# Patient Record
Sex: Female | Born: 1960 | Race: White | Hispanic: No | Smoking: Never smoker
Health system: Southern US, Community
[De-identification: ages and names within clinical notes are randomized; demographics above are authoritative.]

## PROBLEM LIST (undated history)

## (undated) DIAGNOSIS — F329 Major depressive disorder, single episode, unspecified: Secondary | ICD-10-CM

## (undated) DIAGNOSIS — T7840XA Allergy, unspecified, initial encounter: Secondary | ICD-10-CM

## (undated) DIAGNOSIS — F419 Anxiety disorder, unspecified: Secondary | ICD-10-CM

## (undated) DIAGNOSIS — M419 Scoliosis, unspecified: Secondary | ICD-10-CM

## (undated) DIAGNOSIS — M1990 Unspecified osteoarthritis, unspecified site: Secondary | ICD-10-CM

## (undated) DIAGNOSIS — G43909 Migraine, unspecified, not intractable, without status migrainosus: Secondary | ICD-10-CM

## (undated) DIAGNOSIS — M4317 Spondylolisthesis, lumbosacral region: Secondary | ICD-10-CM

## (undated) DIAGNOSIS — F909 Attention-deficit hyperactivity disorder, unspecified type: Secondary | ICD-10-CM

## (undated) DIAGNOSIS — Z973 Presence of spectacles and contact lenses: Secondary | ICD-10-CM

## (undated) DIAGNOSIS — R011 Cardiac murmur, unspecified: Secondary | ICD-10-CM

## (undated) DIAGNOSIS — E785 Hyperlipidemia, unspecified: Secondary | ICD-10-CM

## (undated) DIAGNOSIS — F32A Depression, unspecified: Secondary | ICD-10-CM

## (undated) DIAGNOSIS — Z8489 Family history of other specified conditions: Secondary | ICD-10-CM

## (undated) DIAGNOSIS — M810 Age-related osteoporosis without current pathological fracture: Secondary | ICD-10-CM

## (undated) DIAGNOSIS — K219 Gastro-esophageal reflux disease without esophagitis: Secondary | ICD-10-CM

## (undated) HISTORY — DX: Allergy, unspecified, initial encounter: T78.40XA

## (undated) HISTORY — DX: Anxiety disorder, unspecified: F41.9

## (undated) HISTORY — DX: Hyperlipidemia, unspecified: E78.5

## (undated) HISTORY — PX: CHOLECYSTECTOMY: SHX55

## (undated) HISTORY — PX: BACK SURGERY: SHX140

## (undated) HISTORY — DX: Depression, unspecified: F32.A

## (undated) HISTORY — DX: Attention-deficit hyperactivity disorder, unspecified type: F90.9

## (undated) HISTORY — PX: TONSILLECTOMY: SUR1361

## (undated) HISTORY — DX: Age-related osteoporosis without current pathological fracture: M81.0

## (undated) HISTORY — DX: Major depressive disorder, single episode, unspecified: F32.9

## (undated) HISTORY — DX: Migraine, unspecified, not intractable, without status migrainosus: G43.909

## (undated) HISTORY — PX: CERVICAL ABLATION: SHX5771

## (undated) HISTORY — DX: Scoliosis, unspecified: M41.9

## (undated) HISTORY — PX: SPINE SURGERY: SHX786

---

## 2002-04-03 ENCOUNTER — Encounter: Payer: Self-pay | Admitting: Urology

## 2002-04-03 ENCOUNTER — Ambulatory Visit (HOSPITAL_COMMUNITY): Admission: RE | Admit: 2002-04-03 | Discharge: 2002-04-03 | Payer: Self-pay | Admitting: Urology

## 2005-04-07 ENCOUNTER — Encounter (HOSPITAL_COMMUNITY): Admission: RE | Admit: 2005-04-07 | Discharge: 2005-05-07 | Payer: Self-pay | Admitting: Family Medicine

## 2005-04-21 ENCOUNTER — Ambulatory Visit (HOSPITAL_COMMUNITY): Admission: RE | Admit: 2005-04-21 | Discharge: 2005-04-21 | Payer: Self-pay | Admitting: Family Medicine

## 2005-04-29 ENCOUNTER — Ambulatory Visit: Payer: Self-pay | Admitting: Cardiology

## 2006-08-16 ENCOUNTER — Ambulatory Visit (HOSPITAL_COMMUNITY): Admission: RE | Admit: 2006-08-16 | Discharge: 2006-08-16 | Payer: Self-pay | Admitting: Family Medicine

## 2007-03-29 ENCOUNTER — Emergency Department (HOSPITAL_COMMUNITY): Admission: EM | Admit: 2007-03-29 | Discharge: 2007-03-30 | Payer: Self-pay | Admitting: Emergency Medicine

## 2009-01-05 ENCOUNTER — Emergency Department (HOSPITAL_COMMUNITY): Admission: EM | Admit: 2009-01-05 | Discharge: 2009-01-05 | Payer: Self-pay | Admitting: Emergency Medicine

## 2009-10-14 ENCOUNTER — Ambulatory Visit (HOSPITAL_COMMUNITY): Admission: RE | Admit: 2009-10-14 | Discharge: 2009-10-14 | Payer: Self-pay | Admitting: Family Medicine

## 2009-10-17 ENCOUNTER — Ambulatory Visit (HOSPITAL_COMMUNITY): Admission: RE | Admit: 2009-10-17 | Discharge: 2009-10-17 | Payer: Self-pay | Admitting: Family Medicine

## 2010-05-26 ENCOUNTER — Ambulatory Visit (HOSPITAL_COMMUNITY): Admission: RE | Admit: 2010-05-26 | Discharge: 2010-05-26 | Payer: Self-pay | Admitting: Family Medicine

## 2010-09-21 ENCOUNTER — Encounter: Payer: Self-pay | Admitting: Family Medicine

## 2010-12-09 LAB — COMPREHENSIVE METABOLIC PANEL
ALT: 14 U/L (ref 0–35)
AST: 15 U/L (ref 0–37)
Albumin: 3.8 g/dL (ref 3.5–5.2)
Alkaline Phosphatase: 84 U/L (ref 39–117)
BUN: 15 mg/dL (ref 6–23)
CO2: 27 mEq/L (ref 19–32)
Calcium: 9 mg/dL (ref 8.4–10.5)
Chloride: 104 mEq/L (ref 96–112)
Creatinine, Ser: 0.69 mg/dL (ref 0.4–1.2)
GFR calc Af Amer: >60 mL/min (ref >60)
GFR calc non Af Amer: >60 mL/min (ref >60)
Glucose, Bld: 90 mg/dL (ref 70–99)
Potassium: 3.2 mEq/L — ABNORMAL LOW (ref 3.5–5.1)
Sodium: 139 mEq/L (ref 135–145)
Total Bilirubin: 0.4 mg/dL (ref 0.3–1.2)
Total Protein: 6.4 g/dL (ref 6.0–8.3)

## 2010-12-09 LAB — URINE MICROSCOPIC-ADD ON

## 2010-12-09 LAB — DIFFERENTIAL
Basophils Absolute: 0.1 10*3/uL (ref 0.0–0.1)
Basophils Relative: 1 % (ref 0–1)
Eosinophils Absolute: 0.1 10*3/uL (ref 0.0–0.7)
Eosinophils Relative: 1 % (ref 0–5)
Lymphocytes Relative: 34 % (ref 12–46)
Lymphs Abs: 3.6 10*3/uL (ref 0.7–4.0)
Monocytes Absolute: 0.8 10*3/uL (ref 0.1–1.0)
Monocytes Relative: 8 % (ref 3–12)
Neutro Abs: 6 10*3/uL (ref 1.7–7.7)
Neutrophils Relative %: 57 % (ref 43–77)

## 2010-12-09 LAB — URINALYSIS, ROUTINE W REFLEX MICROSCOPIC
Bilirubin Urine: NEGATIVE
Glucose, UA: NEGATIVE mg/dL
Ketones, ur: NEGATIVE mg/dL
Leukocytes, UA: NEGATIVE
Nitrite: NEGATIVE
Protein, ur: NEGATIVE mg/dL
Specific Gravity, Urine: 1.02 (ref 1.005–1.030)
Urobilinogen, UA: 0.2 mg/dL (ref 0.0–1.0)
pH: 6 (ref 5.0–8.0)

## 2010-12-09 LAB — CBC
HCT: 37.5 % (ref 36.0–46.0)
Hemoglobin: 12.6 g/dL (ref 12.0–15.0)
MCHC: 33.6 g/dL (ref 30.0–36.0)
MCV: 92.6 fL (ref 78.0–100.0)
Platelets: 302 10*3/uL (ref 150–400)
RBC: 4.05 MIL/uL (ref 3.87–5.11)
RDW: 14.4 % (ref 11.5–15.5)
WBC: 10.6 10*3/uL — ABNORMAL HIGH (ref 4.0–10.5)

## 2010-12-09 LAB — LIPASE, BLOOD: Lipase: 29 U/L (ref 11–59)

## 2011-01-16 NOTE — Procedures (Signed)
NAMEJOZI, MALACHI               ACCOUNT NO.:  [id number]   MEDICAL RECORD NO.:  [id number]          PATIENT TYPE:  REC   LOCATION:  RAD                           FACILITY:  [facility]   PHYSICIAN:  Scott A. Gerda Diss, MD    DATE OF BIRTH:  07-Jul-1961   DATE OF PROCEDURE:  DATE OF DISCHARGE:                                    STRESS TEST   STRESS CARDIOLITE.   INDICATION:  Chest discomfort.   RESTING ELECTROCARDIOGRAM:  No acute ST segment changes are noted.   EXERCISE:  The patient did very well with exercise with a gradual increase  in heart rate.  She had a peak heart rate of 162 into stage 4.  She went  1125 total of exercise without having any symptoms other than some increased  breathing.   RECOVERY PHASE:  Uneventful.  Heart rate quickly went down to a resting  level and there were no acute ST segment changes in recovery.   ST SEGMENT RESPONSES:  No acute ST segment changes or depressions were noted  during exercise, a fair amount of artifact.   Blood pressure responds as not hypertensive.   INTERPRETATION:  Normal stress test, await Cardiolite images.       SAL/MEDQ  D:  04/07/2005  T:  04/07/2005  Job:  [id number]

## 2012-11-24 ENCOUNTER — Telehealth: Payer: Self-pay | Admitting: Family Medicine

## 2012-11-24 NOTE — Telephone Encounter (Signed)
Patient wants to change her adderall 40mg  short lasting changed to extending release .she connected her pharmacy but they dont do e-scripts for control medicines.

## 2012-11-25 MED ORDER — AMPHETAMINE-DEXTROAMPHET ER 20 MG PO CP24: ORAL_CAPSULE | ORAL | Status: DC

## 2012-11-25 NOTE — Telephone Encounter (Signed)
Only adderall xr 20 available, therefore 2 adderallxr 20 mg qam script for 60 no refills f/u 3-4 weeks to see how that is doing, ill sign when ready

## 2012-11-25 NOTE — Telephone Encounter (Signed)
  Rx printed and signed by doctor and left up front. Patient notified.

## 2012-12-02 ENCOUNTER — Ambulatory Visit: Payer: Self-pay | Admitting: Family Medicine

## 2012-12-07 ENCOUNTER — Encounter: Payer: Self-pay | Admitting: *Deleted

## 2012-12-09 ENCOUNTER — Ambulatory Visit: Payer: Self-pay | Admitting: Family Medicine

## 2012-12-14 ENCOUNTER — Other Ambulatory Visit: Payer: Self-pay | Admitting: Family Medicine

## 2012-12-28 ENCOUNTER — Ambulatory Visit (INDEPENDENT_AMBULATORY_CARE_PROVIDER_SITE_OTHER): Payer: PRIVATE HEALTH INSURANCE | Admitting: Family Medicine

## 2012-12-28 ENCOUNTER — Encounter: Payer: Self-pay | Admitting: Family Medicine

## 2012-12-28 VITALS — BP 130/88 | HR 80 | Ht 66.5 in | Wt 175.1 lb

## 2012-12-28 DIAGNOSIS — R748 Abnormal levels of other serum enzymes: Secondary | ICD-10-CM

## 2012-12-28 DIAGNOSIS — M545 Low back pain, unspecified: Secondary | ICD-10-CM | POA: Insufficient documentation

## 2012-12-28 DIAGNOSIS — F419 Anxiety disorder, unspecified: Secondary | ICD-10-CM | POA: Insufficient documentation

## 2012-12-28 DIAGNOSIS — F32A Depression, unspecified: Secondary | ICD-10-CM | POA: Insufficient documentation

## 2012-12-28 DIAGNOSIS — Z0000 Encounter for general adult medical examination without abnormal findings: Secondary | ICD-10-CM

## 2012-12-28 DIAGNOSIS — R7402 Elevation of levels of lactic acid dehydrogenase (LDH): Secondary | ICD-10-CM

## 2012-12-28 DIAGNOSIS — E785 Hyperlipidemia, unspecified: Secondary | ICD-10-CM

## 2012-12-28 DIAGNOSIS — F329 Major depressive disorder, single episode, unspecified: Secondary | ICD-10-CM

## 2012-12-28 DIAGNOSIS — G8929 Other chronic pain: Secondary | ICD-10-CM | POA: Insufficient documentation

## 2012-12-28 DIAGNOSIS — F988 Other specified behavioral and emotional disorders with onset usually occurring in childhood and adolescence: Secondary | ICD-10-CM | POA: Insufficient documentation

## 2012-12-28 MED ORDER — AMPHETAMINE-DEXTROAMPHET ER 20 MG PO CP24: ORAL_CAPSULE | ORAL | Status: DC

## 2012-12-28 MED ORDER — DOXYCYCLINE HYCLATE 100 MG PO CAPS: 100.0000 mg | ORAL_CAPSULE | Freq: Two times a day (BID) | ORAL | Status: DC

## 2012-12-28 MED ORDER — MELOXICAM 15 MG PO TABS: 15.0000 mg | ORAL_TABLET | Freq: Every day | ORAL | Status: DC

## 2012-12-28 NOTE — Progress Notes (Signed)
  Subjective:    Patient ID: Cheryl Rowe, female    DOB: July 18, 1961, 52 y.o.   MRN: [id number]  Back Pain This is a chronic problem. The current episode started more than 1 year ago. The problem occurs constantly. The problem has been gradually worsening since onset. The pain is present in the lumbar spine. The quality of the pain is described as aching. The pain does not radiate. The pain is at a severity of 8/10. The pain is moderate. The pain is the same all the time. Stiffness is present all day. She has tried heat and ice (steriod injections) for the symptoms. The treatment provided moderate relief.   Patient is frustrated that she can't lose weight she is trying to exercise some and watch her diet.  Patient taking her ADD medicine does help her stay focused. She is tolerating it well no side effects.   Patient's depression doing well she is tolerating Abilify is helping she would like to reduce her medications I told her for now to continue it in early June she can stop Abilify.  Past medical history hyperlipidemia elevated liver enzymes fatty liver depression chronic low back pain. Patient does not smoke. Family history reviewed noncontributory    Review of Systems  Musculoskeletal: Positive for back pain.  patient denies any chest tightness pressure pain shortness breath denies any swelling in the legs no fever chills no hematuria     Objective:   Physical Exam Lungs are clear hearts regular pulse normal blood pressure good abdomen soft extremities no edema lumbar area moderate discomfort to palpation negative straight leg raise affect good       Assessment & Plan:  #1 lumbar pain-I. Don't feel there is anything that's going to completely removed. I recommend meloxicam. I also recommend considering chiropractor care. If that doesn't help injections might help but the downside would be increased aspect of repetitive injections and steroid use I don't recommend surgery at this  time  #2 ADD-3 prescriptions were given patient is finding good success with her medicine  #3 hyperlipidemia check lab work await results  #4 elevated liver enzymes-probably fatty liver encouraged weight loss.  #5 depression doing well may stop Abilify at the end of May/started June followup patient in 3-4 months.

## 2012-12-28 NOTE — Patient Instructions (Signed)
May stop Abilify at the start of June if everything going well.  Doxycycline twice daily over the course of the next 7-10 days take with a snack and a tall glass of liquids this helps against tick-related infection.

## 2013-01-09 ENCOUNTER — Telehealth: Payer: Self-pay | Admitting: Family Medicine

## 2013-01-09 NOTE — Telephone Encounter (Signed)
Patient would like to increase her Adderall dosage to 2-25 mg pills a day instead of 2-20 mg a day. She said she has no problem sleeping at night and feels that this would be a correct dosage for her. She also said she has no problem coming in.

## 2013-01-09 NOTE — Telephone Encounter (Signed)
Patient advised to come in for office to discuss increase of Adderall. Transferred to front desk to schedule appointment.

## 2013-01-12 ENCOUNTER — Other Ambulatory Visit: Payer: Self-pay | Admitting: Family Medicine

## 2013-01-13 ENCOUNTER — Ambulatory Visit: Payer: PRIVATE HEALTH INSURANCE | Admitting: Family Medicine

## 2013-01-17 ENCOUNTER — Ambulatory Visit (INDEPENDENT_AMBULATORY_CARE_PROVIDER_SITE_OTHER): Payer: PRIVATE HEALTH INSURANCE | Admitting: Nurse Practitioner

## 2013-01-17 ENCOUNTER — Encounter: Payer: Self-pay | Admitting: Nurse Practitioner

## 2013-01-17 VITALS — BP 130/80 | Temp 98.5°F | Wt 179.5 lb

## 2013-01-17 DIAGNOSIS — M545 Low back pain, unspecified: Secondary | ICD-10-CM

## 2013-01-17 DIAGNOSIS — F988 Other specified behavioral and emotional disorders with onset usually occurring in childhood and adolescence: Secondary | ICD-10-CM

## 2013-01-17 DIAGNOSIS — G8929 Other chronic pain: Secondary | ICD-10-CM

## 2013-01-17 MED ORDER — AMPHETAMINE-DEXTROAMPHET ER 30 MG PO CP24: 30.0000 mg | ORAL_CAPSULE | ORAL | Status: DC

## 2013-01-17 MED ORDER — AMPHETAMINE-DEXTROAMPHET ER 20 MG PO CP24: 20.0000 mg | ORAL_CAPSULE | ORAL | Status: DC

## 2013-01-17 MED ORDER — LIDOCAINE 5 % EX PTCH: 1.0000 | MEDICATED_PATCH | CUTANEOUS | Status: DC

## 2013-01-17 NOTE — Patient Instructions (Signed)
Dr. Magnus Sinning  chiropracter  In [place]

## 2013-01-17 NOTE — Assessment & Plan Note (Signed)
Refill lidocaine patches. Refer to chiropractor.

## 2013-01-17 NOTE — Progress Notes (Signed)
Subjective:  Presents for recheck on her ADHD. Currently on Adderall XR 40 mg every morning. Has class beginning 8:00 in the morning, usually late in the afternoon by the time she finishes her homework. Would like to switch to Adderall XR 30 in the morning and 20 in the afternoon. Denies any adverse effects including headache tremors or palpitations. No chest pain. Also patient has a history of low back pain. Would like a refill on lidocaine patches which seems to help some. Plans to start seeing a local chiropractor.  Objective:   BP 130/80  Temp(Src) 98.5 F (36.9 C) (Oral)  Wt 179 lb 8 oz (81.421 kg)  BMI 28.54 kg/m2 NAD. Alert, oriented. Lungs clear. Heart regular rate rhythm.  Assessment:ADD (attention deficit disorder)  Low back pain  Plan: Given 3 separate monthly prescriptions for Adderall XR 30 mg one by mouth every morning and Adderall XR 20 mg one by mouth q. afternoon approximately 7-8 hours after first dose. Patient to call back if any problems including insomnia. Has taken 50 mg total without difficulty in the past. Refill on lidocaine patches. Given information for Dr. Magnus Sinning in [place]. Recheck in 3 months, call back sooner if any problems.

## 2013-01-17 NOTE — Assessment & Plan Note (Signed)
Switch to Adderall XR 30 mg in the morning and 20 mg in the afternoon. Recheck 3 months.

## 2013-02-13 ENCOUNTER — Other Ambulatory Visit: Payer: Self-pay | Admitting: Family Medicine

## 2013-02-15 ENCOUNTER — Telehealth: Payer: Self-pay | Admitting: Family Medicine

## 2013-02-15 NOTE — Telephone Encounter (Signed)
Prescription was written for Adderall XR 25 mg 2 every morning 2 prescriptions were given will followup in 60 days she was advised to return the other prescriptions to [place].

## 2013-02-15 NOTE — Telephone Encounter (Signed)
Was given 3 prescriptions for both on 01/17/13.

## 2013-02-15 NOTE — Telephone Encounter (Signed)
Nurse/Dr. Lorin Picket  amphetamine-dextroamphetamine (ADDERALL XR) 20 MG 24 hr capsule * Sig: Take 1 capsule (20 mg total) by mouth every morning.  amphetamine-dextroamphetamine (ADDERALL XR) 30 MG 24 * Sig: Take 1 capsule (30 mg total) by mouth every morning.  * The patient is having to pay out (2) co pay fees with each prescription. *  The patient is wanting to know if she could have the dispense to be a 50 mg (25mg   Twice a day total) so she will not have to end up paying out 2 co pays.

## 2013-02-27 ENCOUNTER — Other Ambulatory Visit: Payer: Self-pay | Admitting: *Deleted

## 2013-02-27 MED ORDER — ARIPIPRAZOLE 2 MG PO TABS: 2.0000 mg | ORAL_TABLET | Freq: Every day | ORAL | Status: DC

## 2013-03-14 ENCOUNTER — Other Ambulatory Visit: Payer: Self-pay | Admitting: Family Medicine

## 2013-03-15 ENCOUNTER — Other Ambulatory Visit: Payer: Self-pay | Admitting: *Deleted

## 2013-03-15 MED ORDER — TRAZODONE HCL 100 MG PO TABS: 100.0000 mg | ORAL_TABLET | Freq: Every day | ORAL | Status: DC

## 2013-03-15 NOTE — Telephone Encounter (Signed)
RX called in .

## 2013-03-15 NOTE — Telephone Encounter (Signed)
Last office visit 01/17/13.

## 2013-03-15 NOTE — Telephone Encounter (Signed)
Ok plus two monthly ref 

## 2013-03-27 ENCOUNTER — Ambulatory Visit: Payer: PRIVATE HEALTH INSURANCE | Admitting: Family Medicine

## 2013-03-30 ENCOUNTER — Ambulatory Visit: Payer: PRIVATE HEALTH INSURANCE | Admitting: Family Medicine

## 2013-04-18 ENCOUNTER — Encounter: Payer: Self-pay | Admitting: Family Medicine

## 2013-04-18 ENCOUNTER — Ambulatory Visit (INDEPENDENT_AMBULATORY_CARE_PROVIDER_SITE_OTHER): Payer: PRIVATE HEALTH INSURANCE | Admitting: Family Medicine

## 2013-04-18 VITALS — BP 132/78 | Ht 66.0 in | Wt 173.0 lb

## 2013-04-18 DIAGNOSIS — F988 Other specified behavioral and emotional disorders with onset usually occurring in childhood and adolescence: Secondary | ICD-10-CM

## 2013-04-18 DIAGNOSIS — F411 Generalized anxiety disorder: Secondary | ICD-10-CM

## 2013-04-18 MED ORDER — AMPHETAMINE-DEXTROAMPHET ER 25 MG PO CP24: ORAL_CAPSULE | ORAL | Status: DC

## 2013-04-18 NOTE — Progress Notes (Signed)
  Subjective:    Patient ID: Cheryl Rowe, female    DOB: 25-Mar-1961, 52 y.o.   MRN: [id number]  HPIhere for an ADD check. She does not have any concerns.  Patient doing great with ADD medicine helps her focus helps her stay on track she relates she tolerates it she's also been exercising and dieting and losing weight. She tried to be healthy. She does have a history of cholesterol as well as a history of elevated liver enzymes and needs followup on these lab work. She denies any chest pain shortness of breath headaches. No sleep disturbance. States her depression is under good control currently  PMH anxiety depression ADD  Review of Systems See above.    Objective:   Physical Exam Lungs are clear hearts regular pulse normal blood pressure good       Assessment & Plan:  In Dec to do Lipid, liver profile pt will call for lab order to check on cholesterol and elevated liver enzymes Needs counselor in [place] we will have our referral specialist send her a list ADD for prescriptions were given followup in December. Depression stable followup sooner if any problems continue current meds

## 2013-04-28 ENCOUNTER — Other Ambulatory Visit: Payer: Self-pay | Admitting: *Deleted

## 2013-04-28 MED ORDER — MELOXICAM 15 MG PO TABS: 15.0000 mg | ORAL_TABLET | Freq: Every day | ORAL | Status: DC

## 2013-06-14 ENCOUNTER — Other Ambulatory Visit: Payer: Self-pay | Admitting: Family Medicine

## 2013-06-14 NOTE — Telephone Encounter (Signed)
Ok times three 

## 2013-06-30 ENCOUNTER — Ambulatory Visit (INDEPENDENT_AMBULATORY_CARE_PROVIDER_SITE_OTHER): Payer: PRIVATE HEALTH INSURANCE | Admitting: Nurse Practitioner

## 2013-06-30 ENCOUNTER — Encounter: Payer: Self-pay | Admitting: Nurse Practitioner

## 2013-06-30 VITALS — BP 128/86 | Ht 65.0 in | Wt 167.6 lb

## 2013-06-30 DIAGNOSIS — F988 Other specified behavioral and emotional disorders with onset usually occurring in childhood and adolescence: Secondary | ICD-10-CM

## 2013-06-30 MED ORDER — AMPHETAMINE-DEXTROAMPHET ER 30 MG PO CP24: ORAL_CAPSULE | ORAL | Status: DC

## 2013-06-30 MED ORDER — AMPHETAMINE-DEXTROAMPHET ER 25 MG PO CP24: 25.0000 mg | ORAL_CAPSULE | ORAL | Status: DC

## 2013-07-03 ENCOUNTER — Encounter: Payer: Self-pay | Admitting: Nurse Practitioner

## 2013-07-03 NOTE — Assessment & Plan Note (Signed)
Plan: Given 3 separate monthly prescriptions for Adderall XR 25 mg in the morning and Adderall XR 30 mg in the early afternoon. Patient to call if any adverse affects. Otherwise followup in 3 months.

## 2013-07-03 NOTE — Progress Notes (Signed)
Subjective:  Presents for recheck on her ADD. Is currently in school and trying to take care of a family member. Has been very distracted. No trouble sleeping. Staying on task well while on Adderall. Would like to increase afternoon dose slightly to see if this will help.  Objective:   BP 128/86  Ht 5\' 5"  (1.651 m)  Wt 167 lb 9.6 oz (76.023 kg)  BMI 27.89 kg/m2 NAD. Alert, oriented. Lungs clear. Heart regular rate rhythm.  Assessment:ADD (attention deficit disorder)  Plan: Given 3 separate monthly prescriptions for Adderall XR 25 mg in the morning and Adderall XR 30 mg in the early afternoon. Patient to call if any adverse affects. Otherwise followup in 3 months.

## 2013-08-03 ENCOUNTER — Ambulatory Visit (INDEPENDENT_AMBULATORY_CARE_PROVIDER_SITE_OTHER): Payer: PRIVATE HEALTH INSURANCE | Admitting: Family Medicine

## 2013-08-03 ENCOUNTER — Encounter: Payer: Self-pay | Admitting: Family Medicine

## 2013-08-03 VITALS — BP 132/82 | Ht 65.0 in | Wt 166.0 lb

## 2013-08-03 DIAGNOSIS — F988 Other specified behavioral and emotional disorders with onset usually occurring in childhood and adolescence: Secondary | ICD-10-CM

## 2013-08-03 MED ORDER — AMPHETAMINE-DEXTROAMPHET ER 25 MG PO CP24: 25.0000 mg | ORAL_CAPSULE | Freq: Two times a day (BID) | ORAL | Status: DC

## 2013-08-03 NOTE — Progress Notes (Signed)
   Subjective:    Patient ID: Cheryl Rowe, female    DOB: August 22, 1961, 52 y.o.   MRN: [id number]  HPI Patient arrives for an ADHD check up. Doing well on current dose of meds but would like to go to 30 xr twice a day. Patient relates that the medication does not seem to be doing as good as he used to. She states she feels fatigued later in the day and has difficult time focusing and staying on track with her studies she would like to try a higher dose if possible she denies abusing it. PMH ADD she denies any palpitations chest tightness or shortness of breath with using the medication. Family history noncontributory social does not smoke. Patient was seen today for ADD checkup. The following items were discussed in detail. -Compliance with medication was assessed -Importance of study time, doing homework, paying attention/taking good notes in school. -Importance of family involvement with learning -Discussion of many side effects with medications -A review of the patient's blood pressure and weight and eating habits -A review of patient's sleeping habits -Additional issues or questions that family had was addressed in noted below  Review of Systems See above    Objective:   Physical Exam  Lungs clear hearts regular pulse normal eardrums normal throat normal      Assessment & Plan:  #1 viral syndrome should gradually get better #2 ADD increase the dose of the medication 30 mg X. are try that for the next couple months and hopefully be able to go back to the 25 mg X. are in addition to this patient should followup again in 3 months

## 2013-09-15 ENCOUNTER — Other Ambulatory Visit: Payer: Self-pay | Admitting: Family Medicine

## 2013-09-18 NOTE — Telephone Encounter (Signed)
Refill x5

## 2013-09-22 ENCOUNTER — Telehealth: Payer: Self-pay | Admitting: Family Medicine

## 2013-09-22 MED ORDER — CHLORZOXAZONE 500 MG PO TABS: 500.0000 mg | ORAL_TABLET | Freq: Four times a day (QID) | ORAL | Status: DC | PRN

## 2013-09-22 NOTE — Telephone Encounter (Signed)
Patient says that her bad disc is acting up again causing her not to be able to sleep and extremely uncomfortable. She says she cannot come in today because she is having terrible spasms, but hopes to have something called in to help her.  [employer]

## 2013-09-22 NOTE — Telephone Encounter (Signed)
Med sent, pt notified.  

## 2013-09-22 NOTE — Telephone Encounter (Signed)
Parafon Forte K., 1 every 6 hours when necessary muscle spasms, cautioned drowsiness, #21

## 2013-09-26 ENCOUNTER — Ambulatory Visit: Payer: PRIVATE HEALTH INSURANCE | Admitting: Family Medicine

## 2013-09-28 ENCOUNTER — Telehealth: Payer: Self-pay | Admitting: Family Medicine

## 2013-09-28 DIAGNOSIS — Z79899 Other long term (current) drug therapy: Secondary | ICD-10-CM

## 2013-09-28 DIAGNOSIS — E785 Hyperlipidemia, unspecified: Secondary | ICD-10-CM

## 2013-09-28 NOTE — Telephone Encounter (Signed)
Patient called she has lost lab papers from back in December and her adderall 25 mg prescription can you rewrite it.

## 2013-09-29 NOTE — Telephone Encounter (Signed)
Please talk with the patient, her pharmacy printout from [facility] showed that she received 30 Adderall XR of the 30 mg on January 8, then she received 60 Adderall XR 30mg  on the 19th. This is 90 of these medicines. I have a difficult time prescribing 30 more Adderall 25 to this point. From a prescription standpoint if I do a prescription for the 25 mg that will be 120 long-acting AdderallXR in one month's time which is not possible from a prescriber standpoint.  She may have papers to do lipid profile, metabolic 7 and liver.

## 2013-09-29 NOTE — Telephone Encounter (Signed)
Discussed with patient. Cannot do refill on adderall. bloodwork orders are ready. Pt notified she can go over to lab fasting to do bloodwork.

## 2013-09-29 NOTE — Addendum Note (Signed)
Addended by: Carmelina Noun on: 09/29/2013 02:54 PM   Modules accepted: Orders

## 2013-11-20 ENCOUNTER — Ambulatory Visit (INDEPENDENT_AMBULATORY_CARE_PROVIDER_SITE_OTHER): Payer: PRIVATE HEALTH INSURANCE | Admitting: Family Medicine

## 2013-11-20 ENCOUNTER — Encounter: Payer: Self-pay | Admitting: Family Medicine

## 2013-11-20 VITALS — BP 128/82 | Temp 98.2°F | Ht 65.0 in | Wt 190.8 lb

## 2013-11-20 DIAGNOSIS — F988 Other specified behavioral and emotional disorders with onset usually occurring in childhood and adolescence: Secondary | ICD-10-CM

## 2013-11-20 DIAGNOSIS — J019 Acute sinusitis, unspecified: Secondary | ICD-10-CM

## 2013-11-20 MED ORDER — CEFPROZIL 500 MG PO TABS: 500.0000 mg | ORAL_TABLET | Freq: Two times a day (BID) | ORAL | Status: DC

## 2013-11-20 NOTE — Progress Notes (Signed)
   Subjective:    Patient ID: Cheryl Rowe, female    DOB: 01-01-1961, 53 y.o.   MRN: [id number]  Sinus Problem This is a new problem. The current episode started yesterday. Associated symptoms include congestion, coughing, headaches and a sore throat. Pertinent negatives include no ear pain or shortness of breath.   Throat raw, coughing, chest sore, low fever last night No wheeze Moderate headache Chest congestion  appetite fair   Review of Systems  Constitutional: Negative for fever and activity change.  HENT: Positive for congestion, rhinorrhea and sore throat. Negative for ear pain.   Eyes: Negative for discharge.  Respiratory: Positive for cough. Negative for shortness of breath and wheezing.   Cardiovascular: Negative for chest pain.  Neurological: Positive for headaches.       Objective:   Physical Exam  Nursing note and vitals reviewed. Constitutional: She appears well-developed and well-nourished. No distress.  HENT:  Head: Normocephalic.  Nose: Nose normal.  Mouth/Throat: Oropharynx is clear and moist. No oropharyngeal exudate.  Neck: Neck supple.  Cardiovascular: Normal rate, regular rhythm and normal heart sounds.   No murmur heard. Pulmonary/Chest: Effort normal and breath sounds normal. No respiratory distress. She has no wheezes.  Musculoskeletal: She exhibits no edema.  Lymphadenopathy:    She has no cervical adenopathy.  Neurological: She is alert. She exhibits normal muscle tone.  Skin: Skin is warm and dry.  Psychiatric: Her behavior is normal.          Assessment & Plan:  Acute sinusitis-antibiotics prescribed, followup if progressive trouble. Warning signs discussed.  Patient is doing well off of ADD medication.

## 2013-11-26 ENCOUNTER — Other Ambulatory Visit: Payer: Self-pay | Admitting: Family Medicine

## 2013-12-12 ENCOUNTER — Telehealth: Payer: Self-pay | Admitting: Family Medicine

## 2013-12-12 MED ORDER — FLUCONAZOLE 150 MG PO TABS: 150.0000 mg | ORAL_TABLET | Freq: Once | ORAL | Status: DC

## 2013-12-12 NOTE — Telephone Encounter (Signed)
Med sent electronically to pharmacy. Patient notified.

## 2013-12-12 NOTE — Telephone Encounter (Signed)
Patient needs Rx for yeast infection.   [employer]

## 2013-12-12 NOTE — Telephone Encounter (Signed)
Diflucan 150 mg 1 by mouth x1.

## 2013-12-17 ENCOUNTER — Other Ambulatory Visit: Payer: Self-pay | Admitting: Family Medicine

## 2014-01-26 ENCOUNTER — Encounter: Payer: Self-pay | Admitting: Family Medicine

## 2014-01-26 ENCOUNTER — Ambulatory Visit (INDEPENDENT_AMBULATORY_CARE_PROVIDER_SITE_OTHER): Payer: PRIVATE HEALTH INSURANCE | Admitting: Family Medicine

## 2014-01-26 VITALS — BP 144/88 | Ht 66.0 in | Wt 201.8 lb

## 2014-01-26 DIAGNOSIS — M5432 Sciatica, left side: Secondary | ICD-10-CM

## 2014-01-26 DIAGNOSIS — M5430 Sciatica, unspecified side: Secondary | ICD-10-CM

## 2014-01-26 DIAGNOSIS — M545 Low back pain, unspecified: Secondary | ICD-10-CM

## 2014-01-26 DIAGNOSIS — G8929 Other chronic pain: Secondary | ICD-10-CM

## 2014-01-26 MED ORDER — HYDROCODONE-ACETAMINOPHEN 7.5-325 MG PO TABS: 1.0000 | ORAL_TABLET | ORAL | Status: DC | PRN

## 2014-01-26 NOTE — Progress Notes (Signed)
   Subjective:    Patient ID: Cheryl Rowe, female    DOB: [birth year], 53 y.o.   MRN: [id number]  Back Pain This is a recurrent problem. The current episode started more than 1 year ago. The problem occurs constantly. The problem has been gradually worsening since onset. The pain is present in the lumbar spine and gluteal. The quality of the pain is described as aching. The pain radiates to the left knee and left foot. The pain is at a severity of 9/10. The pain is severe. The symptoms are aggravated by bending, position and twisting. Stiffness is present at night and in the morning. Pertinent negatives include no abdominal pain, bladder incontinence, bowel incontinence, chest pain, fever or headaches. She has tried analgesics, heat and home exercises for the symptoms. The treatment provided no relief.  Back pain ongoing for years. Wants to see Dr. Carloyn Manner. Taking ibuprofen for the pain.   Non stop pain  Review of Systems  Constitutional: Negative for fever, activity change, appetite change and fatigue.  Respiratory: Negative for cough.   Cardiovascular: Negative for chest pain.  Gastrointestinal: Negative for abdominal pain and bowel incontinence.  Genitourinary: Negative for bladder incontinence.  Musculoskeletal: Positive for back pain.  Neurological: Negative for headaches.  Psychiatric/Behavioral: Negative for behavioral problems.       Objective:   Physical Exam Lungs clear hearts regular low back moderate tenderness positive straight leg raise. Relates pain and discomfort down the leg. Left side pain.       Assessment & Plan:  Significant lumbar pain with sciatica -- we will go ahead with treatment of pain medication as necessary ibuprofen rest stretching. Patient has had this for him years but is worse over the past few months with progression of the symptoms of burning pain down the left leg waking her up at night I believe it is wise to go ahead and do a MRI of her back and  referral to specialist she would like to see Dr. Carloyn Manner

## 2014-01-26 NOTE — Patient Instructions (Signed)
Ibuprofen 200mg  , may use 3 pills ,3 times a day as needed

## 2014-02-01 ENCOUNTER — Ambulatory Visit (HOSPITAL_COMMUNITY)
Admission: RE | Admit: 2014-02-01 | Discharge: 2014-02-01 | Disposition: A | Discharge status: Home / Self Care | Payer: PRIVATE HEALTH INSURANCE | Source: Ambulatory Visit | Attending: Family Medicine | Admitting: Family Medicine

## 2014-02-01 DIAGNOSIS — M5126 Other intervertebral disc displacement, lumbar region: Secondary | ICD-10-CM | POA: Insufficient documentation

## 2014-02-01 DIAGNOSIS — M4120 Other idiopathic scoliosis, site unspecified: Secondary | ICD-10-CM | POA: Insufficient documentation

## 2014-02-05 ENCOUNTER — Other Ambulatory Visit: Payer: Self-pay | Admitting: Family Medicine

## 2014-02-05 DIAGNOSIS — M545 Low back pain, unspecified: Secondary | ICD-10-CM

## 2014-02-07 ENCOUNTER — Telehealth: Payer: Self-pay | Admitting: Family Medicine

## 2014-02-07 MED ORDER — HYDROCODONE-ACETAMINOPHEN 7.5-325 MG PO TABS: 1.0000 | ORAL_TABLET | ORAL | Status: DC | PRN

## 2014-02-07 NOTE — Telephone Encounter (Signed)
This patient may have a prescription for another 50. One every 4 hours as needed for pain, please ask the patient when she is seeing specialists? She was going to be seeing Dr. Carloyn Manner I am not certain if that has been set up yet. The referral was already put in.

## 2014-02-07 NOTE — Telephone Encounter (Signed)
Rx printed and left up front for patient pick up. Patient notified.

## 2014-02-07 NOTE — Telephone Encounter (Signed)
Patient is out of her pain medication and would like a refill on her hydrocodone.

## 2014-02-07 NOTE — Telephone Encounter (Signed)
Patient got #50 on 01/26/14

## 2014-02-15 ENCOUNTER — Telehealth: Payer: Self-pay | Admitting: Family Medicine

## 2014-02-15 MED ORDER — OXYCODONE-ACETAMINOPHEN 5-325 MG PO TABS
1.0000 | ORAL_TABLET | ORAL | Status: DC | PRN
Start: 2014-02-15 — End: 2014-02-20

## 2014-02-15 NOTE — Telephone Encounter (Signed)
Patient notified script ready for pick up and if itching occurs please stop med. Patient verbalized understanding.

## 2014-02-15 NOTE — Telephone Encounter (Signed)
Please discuss with patient. She can try Percocet but it causes itching she would need to stop it. Prescribed Percocet 5 mg/325 mg, #30, one every 4 hours when necessary pain

## 2014-02-15 NOTE — Telephone Encounter (Signed)
Patient says that the hydrocodone for her back pain is not doing a good job. Also, patient said that on her allergy list it said that she was allergic to percocet, but that was a long time ago and she wants to know if you think she should try this again.

## 2014-02-20 ENCOUNTER — Telehealth: Payer: Self-pay | Admitting: Family Medicine

## 2014-02-20 MED ORDER — OXYCODONE-ACETAMINOPHEN 5-325 MG PO TABS: 1.0000 | ORAL_TABLET | ORAL | Status: DC | PRN

## 2014-02-20 NOTE — Telephone Encounter (Signed)
Nurses, #1-try to verify with the patient how many she is using per day. Based upon that we may be able to do a prescription that they pick up today that we'll have that date written on when she can get it filled. #2-please asked the patient when she is seeing Dr. Carloyn Manner? Please send me what you find out

## 2014-02-20 NOTE — Telephone Encounter (Signed)
Notified pt rx will be ready for pick up tomorrow. Also told her the risk of addiction. Pt has appt 7/10 at 3pm. Pt verbalized understanding.

## 2014-02-20 NOTE — Telephone Encounter (Signed)
Pt is taking 6 pills a day. Her appt with Dr. Carloyn Manner is Sept 29.

## 2014-02-20 NOTE — Telephone Encounter (Signed)
oxyCODONE-acetaminophen (PERCOCET) 5-325 MG per tablet  Can we do this sooner rather than later?  Her Husband has an appt today at 2 in [place] They can pick up the script on the way to or from  This appt.

## 2014-02-20 NOTE — Telephone Encounter (Signed)
Patient picked up Rx for Oxycodone 5/325 #30 on 02/15/14

## 2014-02-20 NOTE — Telephone Encounter (Signed)
She may have a prescription for 90 tablets, one every 4 hours when necessary. Cautioned the patient to try to use as few per day as possible to lessen the risk of addiction. I would recommend that the patient followup before further refills. She ought to followup the week of July 6. I recommend going ahead to get that appointment now.

## 2014-02-20 NOTE — Telephone Encounter (Signed)
appt made for 03/09/14

## 2014-02-20 NOTE — Telephone Encounter (Signed)
Patient said that she will be in [place] around 4:00 and since she lives so far away she is hoping the prescription will be ready.

## 2014-02-21 ENCOUNTER — Other Ambulatory Visit: Payer: Self-pay | Admitting: *Deleted

## 2014-02-21 MED ORDER — VENLAFAXINE HCL ER 75 MG PO CP24: ORAL_CAPSULE | ORAL | Status: DC

## 2014-02-26 ENCOUNTER — Other Ambulatory Visit: Payer: Self-pay | Admitting: Family Medicine

## 2014-03-09 ENCOUNTER — Encounter: Payer: Self-pay | Admitting: Family Medicine

## 2014-03-09 ENCOUNTER — Ambulatory Visit (INDEPENDENT_AMBULATORY_CARE_PROVIDER_SITE_OTHER): Payer: PRIVATE HEALTH INSURANCE | Admitting: Family Medicine

## 2014-03-09 VITALS — BP 112/80 | Ht 66.0 in | Wt 197.2 lb

## 2014-03-09 DIAGNOSIS — M545 Low back pain, unspecified: Secondary | ICD-10-CM

## 2014-03-09 DIAGNOSIS — G8929 Other chronic pain: Secondary | ICD-10-CM

## 2014-03-09 DIAGNOSIS — Z0189 Encounter for other specified special examinations: Secondary | ICD-10-CM

## 2014-03-09 DIAGNOSIS — E785 Hyperlipidemia, unspecified: Secondary | ICD-10-CM

## 2014-03-09 MED ORDER — OXYCODONE-ACETAMINOPHEN 7.5-325 MG PO TABS: 1.0000 | ORAL_TABLET | ORAL | Status: DC | PRN

## 2014-03-09 NOTE — Progress Notes (Signed)
   Subjective:    Patient ID: Cheryl Rowe, female    DOB: May 24, 1961, 53 y.o.   MRN: [id number]  HPI This patient was seen today for chronic pain  The medication list was reviewed and updated.   -Compliance with pain medication: yes The patient was advised the importance of maintaining medication and not using illegal substances with these.  Refills needed: yes, refills on percocet  The patient was educated that we can provide 3 monthly scripts for their medication, it is their responsibility to follow the instructions.  Side effects or complications from medications: none  Patient is aware that pain medications are meant to minimize the severity of the pain to allow their pain levels to improve to allow for better function. They are aware of that pain medications cannot totally remove their pain.  Due for UDT ( at least once per year) :this fall  Patient states that she has no concerns at this time.  Dr Ace Gins tried shots in the past She has significant pain discomfort she takes anywhere between 4 and 6 pain tablets a day and states it is not helping her adequately. Pt had seen Dr Ellene Route in [place] in 2011 without help Sep 29 appointment with Dr Carloyn Manner   Review of Systems  Constitutional: Negative for activity change, appetite change and fatigue.  HENT: Negative for congestion.   Respiratory: Negative for cough and shortness of breath.   Cardiovascular: Negative for chest pain.  Gastrointestinal: Negative for abdominal pain.  Endocrine: Negative for polydipsia and polyphagia.  Genitourinary: Negative for frequency.  Neurological: Negative for weakness.  Psychiatric/Behavioral: Negative for confusion.       Objective:   Physical Exam  Vitals reviewed. Constitutional: She appears well-nourished. No distress.  Cardiovascular: Normal rate, regular rhythm and normal heart sounds.   No murmur heard. Pulmonary/Chest: Effort normal and breath sounds normal. No respiratory  distress.  Musculoskeletal: She exhibits no edema.  Lymphadenopathy:    She has no cervical adenopathy.  Neurological: She is alert. She exhibits normal muscle tone.  Psychiatric: Her behavior is normal.   She is having subjective discomfort in the lower back with positive straight leg raise.       Assessment & Plan:  Dr Carloyn Manner Unfortunately her back pain is not well controlled currently I educated her that the best pain medicine can do is reduce the pain by 40-50%. Also told her that she needs to start doing some physical exercises on a regular basis I showed her the proper exercises to do to try to help this. She will start doing the stretching exercises on a regular basis.  We went ahead and increased her pain medication to 7.5 mg 1 every 4 hours as needed for severe pain cautioned drowsiness she is to try to maintain hopefully just 4 tablets per day she is to followup in approximately 3 weeks to see how this is doing  We will call Dr. Rex Kras office to see if we can get her in sooner. Apparently her in next available appointment is late in September and the patient states it's very difficult for her to I believe that she would benefit from being seen sooner and they would probably try some injections it does not look surgery would be necessary  She was warned about pain medication and the risk of overdosing and risk of addiction

## 2014-03-15 LAB — LIPID PANEL
Cholesterol: 170 mg/dL (ref 0–200)
HDL: 42 mg/dL (ref >39)
LDL Cholesterol: 88 mg/dL (ref 0–99)
Total CHOL/HDL Ratio: 4 Ratio
Triglycerides: 201 mg/dL — ABNORMAL HIGH (ref <150)
VLDL: 40 mg/dL (ref 0–40)

## 2014-03-15 LAB — HEPATIC FUNCTION PANEL
ALT: 21 U/L (ref 0–35)
AST: 21 U/L (ref 0–37)
Albumin: 4.3 g/dL (ref 3.5–5.2)
Alkaline Phosphatase: 89 U/L (ref 39–117)
Bilirubin, Direct: <0.1 mg/dL (ref 0.0–0.3)
Total Bilirubin: 0.2 mg/dL (ref 0.2–1.2)
Total Protein: 6.6 g/dL (ref 6.0–8.3)

## 2014-03-15 LAB — GLUCOSE, RANDOM: Glucose, Bld: 85 mg/dL (ref 70–99)

## 2014-03-16 ENCOUNTER — Encounter: Payer: Self-pay | Admitting: Family Medicine

## 2014-03-22 ENCOUNTER — Encounter (HOSPITAL_COMMUNITY): Payer: Self-pay | Admitting: Emergency Medicine

## 2014-03-22 ENCOUNTER — Emergency Department (HOSPITAL_COMMUNITY): Payer: PRIVATE HEALTH INSURANCE

## 2014-03-22 ENCOUNTER — Telehealth: Payer: Self-pay | Admitting: Family Medicine

## 2014-03-22 ENCOUNTER — Emergency Department (HOSPITAL_COMMUNITY)
Admission: EM | Admit: 2014-03-22 | Discharge: 2014-03-22 | Disposition: A | Discharge status: Home / Self Care | Payer: PRIVATE HEALTH INSURANCE | Attending: Emergency Medicine | Admitting: Emergency Medicine

## 2014-03-22 DIAGNOSIS — F3289 Other specified depressive episodes: Secondary | ICD-10-CM | POA: Insufficient documentation

## 2014-03-22 DIAGNOSIS — F411 Generalized anxiety disorder: Secondary | ICD-10-CM | POA: Insufficient documentation

## 2014-03-22 DIAGNOSIS — G43909 Migraine, unspecified, not intractable, without status migrainosus: Secondary | ICD-10-CM | POA: Insufficient documentation

## 2014-03-22 DIAGNOSIS — Z8739 Personal history of other diseases of the musculoskeletal system and connective tissue: Secondary | ICD-10-CM | POA: Insufficient documentation

## 2014-03-22 DIAGNOSIS — R112 Nausea with vomiting, unspecified: Secondary | ICD-10-CM | POA: Insufficient documentation

## 2014-03-22 DIAGNOSIS — F329 Major depressive disorder, single episode, unspecified: Secondary | ICD-10-CM | POA: Insufficient documentation

## 2014-03-22 DIAGNOSIS — Z8639 Personal history of other endocrine, nutritional and metabolic disease: Secondary | ICD-10-CM | POA: Insufficient documentation

## 2014-03-22 DIAGNOSIS — Z862 Personal history of diseases of the blood and blood-forming organs and certain disorders involving the immune mechanism: Secondary | ICD-10-CM | POA: Insufficient documentation

## 2014-03-22 DIAGNOSIS — R079 Chest pain, unspecified: Secondary | ICD-10-CM | POA: Insufficient documentation

## 2014-03-22 DIAGNOSIS — Z79899 Other long term (current) drug therapy: Secondary | ICD-10-CM | POA: Insufficient documentation

## 2014-03-22 LAB — BASIC METABOLIC PANEL
Anion gap: 13 (ref 5–15)
BUN: 15 mg/dL (ref 6–23)
CO2: 22 mEq/L (ref 19–32)
Calcium: 9.1 mg/dL (ref 8.4–10.5)
Chloride: 104 mEq/L (ref 96–112)
Creatinine, Ser: 0.69 mg/dL (ref 0.50–1.10)
GFR calc Af Amer: >90 mL/min (ref >90)
GFR calc non Af Amer: >90 mL/min (ref >90)
Glucose, Bld: 96 mg/dL (ref 70–99)
Potassium: 4.1 mEq/L (ref 3.7–5.3)
Sodium: 139 mEq/L (ref 137–147)

## 2014-03-22 LAB — I-STAT TROPONIN, ED: Troponin i, poc: 0 ng/mL (ref 0.00–0.08)

## 2014-03-22 LAB — CBC
HCT: 45 % (ref 36.0–46.0)
Hemoglobin: 15.7 g/dL — ABNORMAL HIGH (ref 12.0–15.0)
MCH: 30.8 pg (ref 26.0–34.0)
MCHC: 34.9 g/dL (ref 30.0–36.0)
MCV: 88.2 fL (ref 78.0–100.0)
Platelets: 348 10*3/uL (ref 150–400)
RBC: 5.1 MIL/uL (ref 3.87–5.11)
RDW: 13.2 % (ref 11.5–15.5)
WBC: 13.2 10*3/uL — ABNORMAL HIGH (ref 4.0–10.5)

## 2014-03-22 LAB — HEPATIC FUNCTION PANEL
ALT: 27 U/L (ref 0–35)
AST: 21 U/L (ref 0–37)
Albumin: 4.1 g/dL (ref 3.5–5.2)
Alkaline Phosphatase: 85 U/L (ref 39–117)
Bilirubin, Direct: <0.2 mg/dL (ref 0.0–0.3)
Total Bilirubin: 0.4 mg/dL (ref 0.3–1.2)
Total Protein: 7.1 g/dL (ref 6.0–8.3)

## 2014-03-22 LAB — TROPONIN I: Troponin I: <0.3 ng/mL (ref <0.30)

## 2014-03-22 LAB — LIPASE, BLOOD: Lipase: 26 U/L (ref 11–59)

## 2014-03-22 MED ORDER — RANITIDINE HCL 150 MG PO TABS: 150.0000 mg | ORAL_TABLET | Freq: Two times a day (BID) | ORAL | Status: DC

## 2014-03-22 MED ORDER — CLONAZEPAM 1 MG PO TABS: ORAL_TABLET | ORAL | Status: DC

## 2014-03-22 MED ORDER — SODIUM CHLORIDE 0.9 % IV BOLUS (SEPSIS)
500.0000 mL | Freq: Once | INTRAVENOUS | Status: AC
  Administered 2014-03-22: 500 mL via INTRAVENOUS

## 2014-03-22 MED ORDER — KETOROLAC TROMETHAMINE 30 MG/ML IJ SOLN
30.0000 mg | Freq: Once | INTRAMUSCULAR | Status: AC
  Administered 2014-03-22: 30 mg via INTRAVENOUS
  Filled 2014-03-22: qty 1

## 2014-03-22 MED ORDER — PROMETHAZINE HCL 25 MG PO TABS: 25.0000 mg | ORAL_TABLET | Freq: Four times a day (QID) | ORAL | Status: DC | PRN

## 2014-03-22 MED ORDER — METOCLOPRAMIDE HCL 5 MG/ML IJ SOLN
10.0000 mg | Freq: Once | INTRAMUSCULAR | Status: AC
  Administered 2014-03-22: 10 mg via INTRAVENOUS
  Filled 2014-03-22: qty 2

## 2014-03-22 MED ORDER — ONDANSETRON HCL 4 MG/2ML IJ SOLN
4.0000 mg | Freq: Once | INTRAMUSCULAR | Status: AC
  Administered 2014-03-22: 4 mg via INTRAVENOUS
  Filled 2014-03-22: qty 2

## 2014-03-22 MED ORDER — LORAZEPAM 2 MG/ML IJ SOLN
0.5000 mg | Freq: Once | INTRAMUSCULAR | Status: AC
  Administered 2014-03-22: 0.5 mg via INTRAVENOUS
  Filled 2014-03-22: qty 1

## 2014-03-22 NOTE — ED Provider Notes (Signed)
CSN: [id number]     Arrival date & time 03/22/14  1617 History   First MD Initiated Contact with Patient 03/22/14 1623     Chief Complaint  Patient presents with  . Chest Pain     (Consider location/radiation/quality/duration/timing/severity/associated sxs/prior Treatment) HPI Comments: Patient presents to the ER for evaluation of nausea, vomiting, chest pain. The patient reports that she this morning not feeling well. She has had nausea, vomiting, has not been able to hold anything down all day. This afternoon had onset of severe, constant chest pain with numbness and tingling in the left colon. Patient reports that she has felt these symptoms previously, but that they usually only last a few minutes and then go away. This is the first time the pain has lasted. The patient has not had any diarrhea associated with her symptoms. She has had a normal bowel movement today.  Patient is a 53 y.o. female presenting with chest pain.  Chest Pain Associated symptoms: nausea and vomiting     Past Medical History  Diagnosis Date  . Depression   . Migraine   . Hyperlipidemia   . ADHD (attention deficit hyperactivity disorder)   . Scoliosis     Teenage   Past Surgical History  Procedure Laterality Date  . Cholecystectomy     Family History  Problem Relation Age of Onset  . Hypertension Father    History  Substance Use Topics  . Smoking status: Never Smoker   . Smokeless tobacco: Not on file  . Alcohol Use: Not on file   OB History   Grav Para Term Preterm Abortions TAB SAB Ect Mult Living                 Review of Systems  Cardiovascular: Positive for chest pain.  Gastrointestinal: Positive for nausea and vomiting.  All other systems reviewed and are negative.     Allergies  Cymbalta and Pravastatin  Home Medications   Prior to Admission medications   Medication Sig Start Date End Date Taking? Authorizing Provider  clonazePAM (KLONOPIN) 1 MG tablet Take 1 mg by mouth 2  (two) times daily.   Yes Historical Provider, MD  COMBIPATCH 0.05-0.25 MG/DAY Place 1 patch onto the skin 2 (two) times a week. 03/20/14  Yes Historical Provider, MD  oxyCODONE-acetaminophen (PERCOCET) 7.5-325 MG per tablet Take 1 tablet by mouth every 4 (four) hours as needed for pain. 03/09/14  Yes Kathyrn Drown, MD  traZODone (DESYREL) 100 MG tablet Take 100 mg by mouth at bedtime.   Yes Historical Provider, MD  venlafaxine XR (EFFEXOR-XR) 75 MG 24 hr capsule Take 225 mg by mouth at bedtime.   Yes Historical Provider, MD  promethazine (PHENERGAN) 25 MG tablet Take 1 tablet (25 mg total) by mouth every 6 (six) hours as needed for nausea or vomiting. 03/22/14   Orpah Greek, MD  ranitidine (ZANTAC) 150 MG tablet Take 1 tablet (150 mg total) by mouth 2 (two) times daily. 03/22/14   Orpah Greek, MD   BP 133/81  Pulse 78  Temp(Src) 98.5 F (36.9 C) (Oral)  Resp 16  Ht 5\' 6"  (1.676 m)  Wt 198 lb (89.812 kg)  BMI 31.97 kg/m2  SpO2 100% Physical Exam  Constitutional: She is oriented to person, place, and time. She appears well-developed and well-nourished. She appears distressed (tearful).  HENT:  Head: Normocephalic and atraumatic.  Right Ear: Hearing normal.  Left Ear: Hearing normal.  Nose: Nose normal.  Mouth/Throat: Oropharynx is  clear and moist and mucous membranes are normal.  Eyes: Conjunctivae and EOM are normal. Pupils are equal, round, and reactive to light.  Neck: Normal range of motion. Neck supple.  Cardiovascular: Regular rhythm, S1 normal and S2 normal.  Exam reveals no gallop and no friction rub.   No murmur heard. Pulmonary/Chest: Effort normal and breath sounds normal. No respiratory distress. She exhibits no tenderness.  Abdominal: Soft. Normal appearance and bowel sounds are normal. There is no hepatosplenomegaly. There is no tenderness. There is no rebound, no guarding, no tenderness at McBurney's point and negative Murphy's sign. No hernia.   Musculoskeletal: Normal range of motion.  Neurological: She is alert and oriented to person, place, and time. She has normal strength. No cranial nerve deficit or sensory deficit. Coordination normal. GCS eye subscore is 4. GCS verbal subscore is 5. GCS motor subscore is 6.  Skin: Skin is warm, dry and intact. No rash noted. No cyanosis.  Psychiatric: Her speech is normal and behavior is normal. Thought content normal. Her mood appears anxious.    ED Course  Procedures (including critical care time) Labs Review Labs Reviewed  CBC - Abnormal; Notable for the following:    WBC 13.2 (*)    Hemoglobin 15.7 (*)    All other components within normal limits  BASIC METABOLIC PANEL  TROPONIN I  HEPATIC FUNCTION PANEL  LIPASE, BLOOD  I-STAT TROPOININ, ED    Imaging Review Dg Chest Port 1 View  03/22/2014   CLINICAL DATA:  Chest pain with nausea and vomiting.  EXAM: PORTABLE CHEST - 1 VIEW  COMPARISON:  None.  FINDINGS: Cardiac silhouette is upper limits of normal in size. Mild thoracic aortic calcification is noted. The lungs are well inflated and clear. No pleural effusion or pneumothorax is identified. Moderate thoracic dextroscoliosis is noted.  IMPRESSION: No evidence of acute cardiopulmonary process. Thoracic dextroscoliosis.   Electronically Signed   By: Logan Bores   On: 03/22/2014 16:42     EKG Interpretation   Date/Time:  Thursday March 22 2014 16:25:34 EDT Ventricular Rate:  97 PR Interval:  132 QRS Duration: 89 QT Interval:  355 QTC Calculation: 451 R Axis:   36 Text Interpretation:  Sinus rhythm Baseline wander in lead(s) II III aVF  Otherwise within normal limits No previous tracing Confirmed by Euna Armon   MD, Moranda Billiot ([id number]) on 03/22/2014 4:31:06 PM      MDM   Final diagnoses:  Chest pain, unspecified chest pain type  Nausea and vomiting in adult    Patient presented to the ER for evaluation of chest pain. The patient reports that she has had nausea, vomiting  throughout the course of today. This evening, however, she developed central chest pain with numbness and tingling in the left arm. She had a normal neurologic examination, no weakness in the upper extremity. Sensation is normal. Patient's chest pain was resolving at time of arrival. She was very anxious. Patient administered Zofran and Ativan upon arrival with significant improvement. Chest pain resolved and she had no further vomiting. Initial EKG was unremarkable. Troponin was negative. Patient held in the ER for a second troponin which was also negative. She has had no recurrence of her symptoms. This x-ray was normal, no concern for esophageal rupture or other intrathoracic etiology of her pain. The patient is very low risk for cardiac etiology. Symptoms are very atypical. Heart score = 2 (1 for age, 1 for family history). Patient appropriate for outpatient followup to her primary doctor. Was  prescribed Phenergan and ranitidine for symptomatic treatment.    Orpah Greek, MD 03/22/14 [phone]

## 2014-03-22 NOTE — Discharge Instructions (Signed)

## 2014-03-22 NOTE — ED Notes (Addendum)
Pt reports left sided chest pain x2 months. Pt reports chest pain episode started this am and got worse approximately one hour ago. Pt reports one hour ago she began to vomit and left arm went "numb". Pt denies any changes in vision. Pt tearful and anxious in triage.

## 2014-03-22 NOTE — Telephone Encounter (Signed)
Pt needs RF on her Clonzepam 1mg , states pharmacy sent here 03/19/14 and they've never heard back from [place], can we send in Rx with RF's, call pt when done (pt will run out during the weekend) [employer]

## 2014-03-22 NOTE — ED Notes (Signed)
Patient verbalizes understanding of discharge instructions, prescription medications, follow up care, and home care. Patient ambulatory out of department escorted out of department at this time by family member

## 2014-03-22 NOTE — ED Notes (Signed)
Patient now complaining of headache. Dr. Betsey Holiday now at bedside talking with patient.

## 2014-03-22 NOTE — Telephone Encounter (Signed)
Seen 03/09/14

## 2014-03-22 NOTE — Telephone Encounter (Signed)
Notified patient via VM stating we sent in the med. 

## 2014-03-22 NOTE — Telephone Encounter (Signed)
May refill this +4 additional refills 

## 2014-03-28 ENCOUNTER — Other Ambulatory Visit: Payer: Self-pay | Admitting: Family Medicine

## 2014-03-28 NOTE — Telephone Encounter (Signed)
Just seen ok for 6 mos

## 2014-04-03 ENCOUNTER — Ambulatory Visit: Payer: PRIVATE HEALTH INSURANCE | Admitting: Family Medicine

## 2014-04-25 ENCOUNTER — Ambulatory Visit: Payer: PRIVATE HEALTH INSURANCE | Admitting: Family Medicine

## 2014-06-28 ENCOUNTER — Other Ambulatory Visit: Payer: Self-pay | Admitting: Family Medicine

## 2014-08-16 ENCOUNTER — Other Ambulatory Visit: Payer: Self-pay | Admitting: Family Medicine

## 2014-08-16 NOTE — Telephone Encounter (Signed)
1 refill needs follow-up office visit

## 2014-08-16 NOTE — Telephone Encounter (Signed)
Last seen 03/09/14

## 2014-08-20 ENCOUNTER — Other Ambulatory Visit: Payer: Self-pay | Admitting: *Deleted

## 2014-08-20 ENCOUNTER — Other Ambulatory Visit: Payer: Self-pay | Admitting: Family Medicine

## 2014-08-20 ENCOUNTER — Telehealth: Payer: Self-pay

## 2014-08-20 NOTE — Telephone Encounter (Signed)
error 

## 2014-08-27 ENCOUNTER — Ambulatory Visit (INDEPENDENT_AMBULATORY_CARE_PROVIDER_SITE_OTHER): Payer: PRIVATE HEALTH INSURANCE | Admitting: Family Medicine

## 2014-08-27 ENCOUNTER — Encounter: Payer: Self-pay | Admitting: Family Medicine

## 2014-08-27 VITALS — BP 110/74 | Ht 66.5 in | Wt 204.0 lb

## 2014-08-27 DIAGNOSIS — F988 Other specified behavioral and emotional disorders with onset usually occurring in childhood and adolescence: Secondary | ICD-10-CM

## 2014-08-27 DIAGNOSIS — M7581 Other shoulder lesions, right shoulder: Secondary | ICD-10-CM

## 2014-08-27 DIAGNOSIS — F909 Attention-deficit hyperactivity disorder, unspecified type: Secondary | ICD-10-CM

## 2014-08-27 MED ORDER — NAPROXEN 500 MG PO TABS: 500.0000 mg | ORAL_TABLET | Freq: Two times a day (BID) | ORAL | Status: DC

## 2014-08-27 MED ORDER — AMPHETAMINE-DEXTROAMPHET ER 20 MG PO CP24: 20.0000 mg | ORAL_CAPSULE | ORAL | Status: DC

## 2014-08-27 NOTE — Progress Notes (Signed)
   Subjective:    Patient ID: Cheryl Rowe, female    DOB: 1961-08-17, 53 y.o.   MRN: [id number]  Shoulder Pain  This is a recurrent problem. Episode onset: 1 year ago. Treatments tried: aleve. The treatment provided no relief.   Requesting to go back on adderall for the next 2 months.  See discussion below. I advised patient I did not recommend this medication long-term. Short-term only. Patient denies misusing it. States she is having a difficult time focusing staying on track. Review of Systems She relates right shoulder pain discomfort.    Objective:   Physical Exam Right shoulder pain discomfort with certain movements no obvious tear. Lungs clear heart regular. Blood pressure good.       Assessment & Plan:  Right shoulder pain-probable rotator cuff tendinitis, Naprosyn twice a day for the next 2-3 weeks, exercises shown. If not getting better over the next month notify us we'll set her up with orthopedics  Chronic lumbar pain patient contemplating possible surgery but we'll not be doing it currently  Adult ADD-this is been interfering with her focus and follow through. She states she has a lot going on taking care of her mother who is having surgery she requests 2 months of medication. Patient has had problems in the past being on a higher dose. I recommend a low-dose 20 mg for the next 2 months only. Not for long term use.

## 2014-08-28 ENCOUNTER — Telehealth: Payer: Self-pay

## 2014-08-28 MED ORDER — PREDNISONE 20 MG PO TABS: ORAL_TABLET | ORAL | Status: DC

## 2014-08-28 NOTE — Telephone Encounter (Signed)
Patient states that the area is extensive and it is spreading and requested the Prednisone 20 mg short term taper. Medication sent to pharmacy.

## 2014-08-28 NOTE — Telephone Encounter (Signed)
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 Pt is requesting that something be called in. [facility] [place]

## 2014-08-28 NOTE — Telephone Encounter (Signed)
4 very small localized area has typically I recommend Elocon cream, applied twice a day typically for 5-7 days. If the area is extensive prednisone tablets can be used but prednisone can cause increased appetite as well as causes nausea in some people. Please discuss these options with the patient. If she chooses prednisone I would recommend 20 mg tablet,3qd for 3d then 2qd for 3d then 1qd for 3d Certainly follow-up if ongoing troubles

## 2014-09-24 ENCOUNTER — Ambulatory Visit: Payer: PRIVATE HEALTH INSURANCE | Admitting: Family Medicine

## 2014-09-26 ENCOUNTER — Other Ambulatory Visit: Payer: Self-pay | Admitting: Family Medicine

## 2014-09-28 ENCOUNTER — Ambulatory Visit (INDEPENDENT_AMBULATORY_CARE_PROVIDER_SITE_OTHER): Payer: BLUE CROSS/BLUE SHIELD | Admitting: Family Medicine

## 2014-09-28 ENCOUNTER — Encounter: Payer: Self-pay | Admitting: Family Medicine

## 2014-09-28 VITALS — BP 132/88 | Ht 66.0 in | Wt 202.8 lb

## 2014-09-28 DIAGNOSIS — R5383 Other fatigue: Secondary | ICD-10-CM

## 2014-09-28 MED ORDER — PHENTERMINE HCL 37.5 MG PO CAPS: 37.5000 mg | ORAL_CAPSULE | ORAL | Status: DC

## 2014-09-28 NOTE — Progress Notes (Signed)
   Subjective:    Patient ID: Cheryl Rowe, female    DOB: [birth year], 54 y.o.   MRN: [id number]  HPIpt here to discuss weight loss med. She states she only took adderall for 2 days and she flushed the med because she was having side effects from the med.   She states the Adderall caused her to feel irritable so she stop taking it and flushed. She relates a lot of fatigue and tiredness she relates low back pain and discomfort she feels her weight has a lot to do with it. She wanted to know if there were any weight loss medications it could be used. I did discuss with her how Adipex is a potentially addictive medicine. She denies of desiring to abuse it. We also some of the new or medications but her current insurance does not cover these in she states she cannot afford them.  Review of Systems     Objective:   Physical Exam Her lungs clear hearts regular pulse normal       Assessment & Plan:  Cheryl Rowe relates a lot of her tiredness in her low back pain to her excessive weight. She would like to try Adipex one each morning I told her after discussing multiple options that this particular medicine can only be used for a short run to kick start weight loss. She denies abusing it. She would like to try this. She states she no longer has the Adderall that she was prescribed she states that she did get rid of this medication. She relates that she has not filled the second prescription. She was given a prescription for Adipex one every morning for 30 days she is to follow-up in 4 weeks. Hopefully if she loses some weight and will help with her chronic back pain patient does not want to do surgery currently.

## 2014-10-08 ENCOUNTER — Telehealth: Payer: Self-pay | Admitting: Family Medicine

## 2014-10-08 NOTE — Telephone Encounter (Signed)
Completed form & faxed to [employer] for PA of Venlafaxine (Effexor)

## 2014-10-08 NOTE — Telephone Encounter (Signed)
Calling to check on P/A for effexor.  She says she has 4 days of pills left and she is hoping we can get this done soon.

## 2014-10-09 NOTE — Telephone Encounter (Signed)
Rx prior auth APPROVED for pt's venlafaxine XR (EFFEXOR-XR) 75 MG 24 hr capsule, good 10/09/14-10/09/15 through [employer], faxed approval to [employer], called & notified pt

## 2014-10-22 ENCOUNTER — Other Ambulatory Visit: Payer: Self-pay | Admitting: Family Medicine

## 2014-10-22 MED ORDER — PHENTERMINE HCL 37.5 MG PO CAPS: 37.5000 mg | ORAL_CAPSULE | ORAL | Status: DC

## 2014-10-22 NOTE — Addendum Note (Signed)
Addended by: Jesusita Oka on: 10/22/2014 10:37 AM   Modules accepted: Orders

## 2014-10-22 NOTE — Telephone Encounter (Signed)
#  1 she may have 2 additional prescriptions for phentermine 37.5 mg one every morning #2 she will need to schedule a follow-up office visit within 60 days #3 prescriptions can either be mailed to her or she can pick them up.

## 2014-10-22 NOTE — Telephone Encounter (Signed)
Patient is ready for you to write two more scripts for phentermine 37.5 mg sating she is doing well on this. Wanting to know if you can call into [facility].

## 2014-10-22 NOTE — Telephone Encounter (Signed)
Script faxed to pharmacy. Left message on voicemail notifying patient that medication was sent to pharmacy and schedule an office visit in 60 days.

## 2014-10-23 ENCOUNTER — Telehealth: Payer: Self-pay | Admitting: Family Medicine

## 2014-10-23 NOTE — Telephone Encounter (Signed)
Script faxed to [facility] at [phone] per patient's request.

## 2014-10-23 NOTE — Telephone Encounter (Signed)
Pt is requesting that her refill for her phentermine to be resent to [facility] in [place]. She called them and they said they have not received anything.

## 2014-11-22 ENCOUNTER — Other Ambulatory Visit: Payer: Self-pay | Admitting: *Deleted

## 2014-11-26 NOTE — Telephone Encounter (Signed)
Denied ,address at standard follow up

## 2014-12-12 ENCOUNTER — Other Ambulatory Visit: Payer: Self-pay | Admitting: Family Medicine

## 2014-12-13 NOTE — Telephone Encounter (Signed)
This +2 refill needs follow-up by summer

## 2014-12-20 ENCOUNTER — Other Ambulatory Visit: Payer: Self-pay | Admitting: Family Medicine

## 2014-12-21 ENCOUNTER — Encounter: Payer: Self-pay | Admitting: Family Medicine

## 2014-12-21 ENCOUNTER — Ambulatory Visit (INDEPENDENT_AMBULATORY_CARE_PROVIDER_SITE_OTHER): Payer: BLUE CROSS/BLUE SHIELD | Admitting: Family Medicine

## 2014-12-21 VITALS — BP 128/84 | Ht 66.0 in | Wt 196.8 lb

## 2014-12-21 DIAGNOSIS — G8929 Other chronic pain: Secondary | ICD-10-CM

## 2014-12-21 DIAGNOSIS — M545 Low back pain, unspecified: Secondary | ICD-10-CM

## 2014-12-21 MED ORDER — PHENTERMINE HCL 37.5 MG PO CAPS: 37.5000 mg | ORAL_CAPSULE | ORAL | Status: DC

## 2014-12-21 MED ORDER — LIDOCAINE 5 % EX PTCH: 1.0000 | MEDICATED_PATCH | CUTANEOUS | Status: DC

## 2014-12-21 NOTE — Progress Notes (Signed)
   Subjective:    Patient ID: Cheryl Rowe, female    DOB: Mar 24, 1961, 54 y.o.   MRN: [id number]  HPI Patient arrives for a follow up on Adipex. Patient states she lost 12 lbs with me but gained some back when she stopped her hormone pills and would like a refill of Adipex. Patient states some chronic low back pain using Lidoderm patches seems to help out. Review of Systems Denies abdominal pain vomiting rectal bleeding fever chills    Objective:   Physical Exam  Subjective low back discomfort lungs clear heart regular      Assessment & Plan:  She is losing some weight she is tolerating medicines well not abusing it one additional refill of phentermine given she is to follow-up for regular health checkups and I advised her to get lipid profile along with other lab work July or August  Chronic back pain prescription for lidocaine patches given. Patient trying to avoid using narcotics she will be following up with the back specialist

## 2015-01-04 ENCOUNTER — Ambulatory Visit: Payer: BLUE CROSS/BLUE SHIELD | Admitting: Family Medicine

## 2015-01-06 ENCOUNTER — Other Ambulatory Visit: Payer: Self-pay | Admitting: Family Medicine

## 2015-03-06 ENCOUNTER — Other Ambulatory Visit: Payer: Self-pay | Admitting: Family Medicine

## 2015-03-06 ENCOUNTER — Ambulatory Visit: Payer: BLUE CROSS/BLUE SHIELD | Admitting: Family Medicine

## 2015-03-06 NOTE — Telephone Encounter (Signed)
Okay with this +4 additional refills

## 2015-03-13 ENCOUNTER — Other Ambulatory Visit: Payer: Self-pay | Admitting: Family Medicine

## 2015-03-15 NOTE — Telephone Encounter (Signed)
Needs office visit.

## 2015-04-04 ENCOUNTER — Other Ambulatory Visit: Payer: Self-pay | Admitting: Family Medicine

## 2015-04-11 ENCOUNTER — Observation Stay (HOSPITAL_COMMUNITY)
Admission: EM | Admit: 2015-04-11 | Discharge: 2015-04-13 | Disposition: A | Discharge status: Home / Self Care | Payer: BLUE CROSS/BLUE SHIELD | Attending: Internal Medicine | Admitting: Internal Medicine

## 2015-04-11 ENCOUNTER — Ambulatory Visit: Payer: BLUE CROSS/BLUE SHIELD | Admitting: Family Medicine

## 2015-04-11 ENCOUNTER — Encounter (HOSPITAL_COMMUNITY): Payer: Self-pay | Admitting: Emergency Medicine

## 2015-04-11 ENCOUNTER — Emergency Department (HOSPITAL_COMMUNITY): Payer: BLUE CROSS/BLUE SHIELD

## 2015-04-11 ENCOUNTER — Telehealth: Payer: Self-pay | Admitting: Family Medicine

## 2015-04-11 DIAGNOSIS — K529 Noninfective gastroenteritis and colitis, unspecified: Secondary | ICD-10-CM | POA: Diagnosis not present

## 2015-04-11 DIAGNOSIS — F329 Major depressive disorder, single episode, unspecified: Secondary | ICD-10-CM | POA: Diagnosis not present

## 2015-04-11 DIAGNOSIS — R109 Unspecified abdominal pain: Secondary | ICD-10-CM | POA: Diagnosis present

## 2015-04-11 DIAGNOSIS — E876 Hypokalemia: Secondary | ICD-10-CM | POA: Diagnosis present

## 2015-04-11 DIAGNOSIS — E860 Dehydration: Secondary | ICD-10-CM | POA: Diagnosis present

## 2015-04-11 DIAGNOSIS — R1084 Generalized abdominal pain: Secondary | ICD-10-CM | POA: Diagnosis not present

## 2015-04-11 DIAGNOSIS — Z79899 Other long term (current) drug therapy: Secondary | ICD-10-CM | POA: Insufficient documentation

## 2015-04-11 DIAGNOSIS — G43909 Migraine, unspecified, not intractable, without status migrainosus: Secondary | ICD-10-CM | POA: Diagnosis not present

## 2015-04-11 DIAGNOSIS — M419 Scoliosis, unspecified: Secondary | ICD-10-CM | POA: Diagnosis not present

## 2015-04-11 DIAGNOSIS — F909 Attention-deficit hyperactivity disorder, unspecified type: Secondary | ICD-10-CM | POA: Diagnosis not present

## 2015-04-11 DIAGNOSIS — E785 Hyperlipidemia, unspecified: Secondary | ICD-10-CM | POA: Diagnosis not present

## 2015-04-11 DIAGNOSIS — K2970 Gastritis, unspecified, without bleeding: Secondary | ICD-10-CM | POA: Diagnosis present

## 2015-04-11 LAB — LIPASE, BLOOD: Lipase: 25 U/L (ref 22–51)

## 2015-04-11 LAB — URINALYSIS, ROUTINE W REFLEX MICROSCOPIC
Glucose, UA: NEGATIVE mg/dL
Ketones, ur: >80 mg/dL — AB
Leukocytes, UA: NEGATIVE
Nitrite: NEGATIVE
Protein, ur: 30 mg/dL — AB
Specific Gravity, Urine: >1.03 — ABNORMAL HIGH (ref 1.005–1.030)
Urobilinogen, UA: 0.2 mg/dL (ref 0.0–1.0)
pH: 6 (ref 5.0–8.0)

## 2015-04-11 LAB — COMPREHENSIVE METABOLIC PANEL
ALT: 92 U/L — ABNORMAL HIGH (ref 14–54)
AST: 53 U/L — ABNORMAL HIGH (ref 15–41)
Albumin: 4.8 g/dL (ref 3.5–5.0)
Alkaline Phosphatase: 78 U/L (ref 38–126)
Anion gap: 14 (ref 5–15)
BUN: 24 mg/dL — ABNORMAL HIGH (ref 6–20)
CO2: 24 mmol/L (ref 22–32)
Calcium: 9.8 mg/dL (ref 8.9–10.3)
Chloride: 100 mmol/L — ABNORMAL LOW (ref 101–111)
Creatinine, Ser: 0.66 mg/dL (ref 0.44–1.00)
GFR calc Af Amer: >60 mL/min (ref >60)
GFR calc non Af Amer: >60 mL/min (ref >60)
Glucose, Bld: 122 mg/dL — ABNORMAL HIGH (ref 65–99)
Potassium: 3.2 mmol/L — ABNORMAL LOW (ref 3.5–5.1)
Sodium: 138 mmol/L (ref 135–145)
Total Bilirubin: 0.6 mg/dL (ref 0.3–1.2)
Total Protein: 8.2 g/dL — ABNORMAL HIGH (ref 6.5–8.1)

## 2015-04-11 LAB — CBC WITH DIFFERENTIAL/PLATELET
Basophils Absolute: 0 10*3/uL (ref 0.0–0.1)
Basophils Relative: 0 % (ref 0–1)
Eosinophils Absolute: 0 10*3/uL (ref 0.0–0.7)
Eosinophils Relative: 0 % (ref 0–5)
HCT: 42.7 % (ref 36.0–46.0)
Hemoglobin: 14.4 g/dL (ref 12.0–15.0)
Lymphocytes Relative: 12 % (ref 12–46)
Lymphs Abs: 1.5 10*3/uL (ref 0.7–4.0)
MCH: 30.1 pg (ref 26.0–34.0)
MCHC: 33.7 g/dL (ref 30.0–36.0)
MCV: 89.3 fL (ref 78.0–100.0)
Monocytes Absolute: 0.6 10*3/uL (ref 0.1–1.0)
Monocytes Relative: 5 % (ref 3–12)
Neutro Abs: 10.6 10*3/uL — ABNORMAL HIGH (ref 1.7–7.7)
Neutrophils Relative %: 83 % — ABNORMAL HIGH (ref 43–77)
Platelets: 398 10*3/uL (ref 150–400)
RBC: 4.78 MIL/uL (ref 3.87–5.11)
RDW: 13.2 % (ref 11.5–15.5)
WBC: 12.7 10*3/uL — ABNORMAL HIGH (ref 4.0–10.5)

## 2015-04-11 LAB — URINE MICROSCOPIC-ADD ON

## 2015-04-11 LAB — TROPONIN I: Troponin I: <0.03 ng/mL (ref <0.031)

## 2015-04-11 MED ORDER — SODIUM CHLORIDE 0.9 % IV SOLN
INTRAVENOUS | Status: AC
  Administered 2015-04-11: 23:00:00 via INTRAVENOUS

## 2015-04-11 MED ORDER — MORPHINE SULFATE 4 MG/ML IJ SOLN
4.0000 mg | Freq: Once | INTRAMUSCULAR | Status: AC
  Administered 2015-04-11: 4 mg via INTRAVENOUS
  Filled 2015-04-11: qty 1

## 2015-04-11 MED ORDER — ONDANSETRON HCL 4 MG/2ML IJ SOLN
4.0000 mg | Freq: Once | INTRAMUSCULAR | Status: AC
  Administered 2015-04-11: 4 mg via INTRAVENOUS
  Filled 2015-04-11: qty 2

## 2015-04-11 MED ORDER — SODIUM CHLORIDE 0.9 % IV BOLUS (SEPSIS)
1000.0000 mL | Freq: Once | INTRAVENOUS | Status: AC
  Administered 2015-04-11: 1000 mL via INTRAVENOUS

## 2015-04-11 MED ORDER — PROMETHAZINE HCL 12.5 MG PO TABS
12.5000 mg | ORAL_TABLET | Freq: Four times a day (QID) | ORAL | Status: DC | PRN
  Administered 2015-04-12 – 2015-04-13 (×3): 12.5 mg via ORAL
  Filled 2015-04-11 (×3): qty 1

## 2015-04-11 MED ORDER — POTASSIUM CHLORIDE IN NACL 20-0.9 MEQ/L-% IV SOLN
INTRAVENOUS | Status: DC
  Administered 2015-04-11 – 2015-04-13 (×3): via INTRAVENOUS

## 2015-04-11 MED ORDER — IOHEXOL 300 MG/ML  SOLN
100.0000 mL | Freq: Once | INTRAMUSCULAR | Status: AC | PRN
  Administered 2015-04-11: 100 mL via INTRAVENOUS

## 2015-04-11 MED ORDER — IOHEXOL 300 MG/ML  SOLN
50.0000 mL | Freq: Once | INTRAMUSCULAR | Status: AC | PRN
  Administered 2015-04-11: 50 mL via ORAL

## 2015-04-11 MED ORDER — HYDROMORPHONE HCL 1 MG/ML IJ SOLN
1.0000 mg | INTRAMUSCULAR | Status: DC | PRN
  Administered 2015-04-12 – 2015-04-13 (×8): 1 mg via INTRAVENOUS
  Filled 2015-04-11 (×8): qty 1

## 2015-04-11 MED ORDER — ENSURE ENLIVE PO LIQD
237.0000 mL | Freq: Two times a day (BID) | ORAL | Status: DC
  Administered 2015-04-12 – 2015-04-13 (×2): 237 mL via ORAL

## 2015-04-11 MED ORDER — ALUM & MAG HYDROXIDE-SIMETH 200-200-20 MG/5ML PO SUSP: 30.0000 mL | Freq: Four times a day (QID) | ORAL | Status: DC | PRN

## 2015-04-11 MED ORDER — PANTOPRAZOLE SODIUM 40 MG IV SOLR
40.0000 mg | Freq: Once | INTRAVENOUS | Status: AC
  Administered 2015-04-11: 40 mg via INTRAVENOUS
  Filled 2015-04-11: qty 40

## 2015-04-11 MED ORDER — ACETAMINOPHEN-CODEINE #3 300-30 MG PO TABS
1.0000 | ORAL_TABLET | Freq: Once | ORAL | Status: AC
  Administered 2015-04-11: 1 via ORAL
  Filled 2015-04-11: qty 1

## 2015-04-11 NOTE — ED Provider Notes (Signed)
CSN: [id number]     Arrival date & time 04/11/15  1335 History   First MD Initiated Contact with Patient 04/11/15 1340     Chief Complaint  Patient presents with  . Abdominal Pain     (Consider location/radiation/quality/duration/timing/severity/associated sxs/prior Treatment) HPI.... Generalized abdominal pain with burning sensation in her esophagus for 2-3 days with associated diarrhea..  Patient has been unable to keep fluids down. She feels dehydrated. She is not normally ill. Status post cholecystectomy.  No fever, chills, dysuria, chest pain, dyspnea  Past Medical History  Diagnosis Date  . Depression   . Migraine   . Hyperlipidemia   . ADHD (attention deficit hyperactivity disorder)   . Scoliosis     Teenage   Past Surgical History  Procedure Laterality Date  . Cholecystectomy     Family History  Problem Relation Age of Onset  . Hypertension Father    Social History  Substance Use Topics  . Smoking status: Never Smoker   . Smokeless tobacco: None  . Alcohol Use: No   OB History    No data available     Review of Systems  All other systems reviewed and are negative.     Allergies  Cymbalta and Pravastatin  Home Medications   Prior to Admission medications   Medication Sig Start Date End Date Taking? Authorizing Provider  clonazePAM (KLONOPIN) 1 MG tablet TAKE ONE TABLET BY MOUTH TWICE DAILY 03/06/15  Yes Kathyrn Drown, MD  DUAVEE 0.45-20 MG TABS Take 1 tablet by mouth daily.  04/04/15  Yes Historical Provider, MD  lidocaine (LIDODERM) 5 % Place 1 patch onto the skin daily. Remove & Discard patch within 12 hours or as directed by MD 12/21/14  Yes Kathyrn Drown, MD  Naproxen Sodium (ALEVE) 220 MG CAPS Take 440 capsules by mouth daily.   Yes Historical Provider, MD  oxyCODONE-acetaminophen (PERCOCET) 7.5-325 MG per tablet Take 2 tablets by mouth every 6 (six) hours as needed.  03/28/15  Yes Historical Provider, MD  traZODone (DESYREL) 100 MG tablet TAKE ONE  TABLET BY MOUTH ONCE DAILY 03/15/15  Yes Kathyrn Drown, MD  Venlafaxine HCl 75 MG TB24 TAKE THREE TABLETS BY MOUTH ONCE DAILY 04/04/15  Yes Kathyrn Drown, MD  phentermine 37.5 MG capsule Take 1 capsule (37.5 mg total) by mouth every morning. Patient not taking: Reported on 04/11/2015 12/21/14   Kathyrn Drown, MD   BP 122/81 mmHg  Pulse 88  Temp(Src) 98.3 F (36.8 C) (Oral)  Resp 20  Ht 5\' 6"  (1.676 m)  Wt 205 lb (92.987 kg)  BMI 33.10 kg/m2  SpO2 98% Physical Exam  Constitutional: She is oriented to person, place, and time. She appears well-developed and well-nourished.  HENT:  Head: Normocephalic and atraumatic.  Eyes: Conjunctivae and EOM are normal. Pupils are equal, round, and reactive to light.  Neck: Normal range of motion. Neck supple.  Cardiovascular: Normal rate and regular rhythm.   Pulmonary/Chest: Effort normal and breath sounds normal.  Abdominal: Soft. Bowel sounds are normal.  Minimal generalized tenderness  Musculoskeletal: Normal range of motion.  Neurological: She is alert and oriented to person, place, and time.  Skin: Skin is warm and dry.  Psychiatric: She has a normal mood and affect. Her behavior is normal.  Nursing note and vitals reviewed.   ED Course  Procedures (including critical care time) Labs Review Labs Reviewed  CBC WITH DIFFERENTIAL/PLATELET - Abnormal; Notable for the following:    WBC 12.7 (*)  Neutrophils Relative % 83 (*)    Neutro Abs 10.6 (*)    All other components within normal limits  COMPREHENSIVE METABOLIC PANEL - Abnormal; Notable for the following:    Potassium 3.2 (*)    Chloride 100 (*)    Glucose, Bld 122 (*)    BUN 24 (*)    Total Protein 8.2 (*)    AST 53 (*)    ALT 92 (*)    All other components within normal limits  URINALYSIS, ROUTINE W REFLEX MICROSCOPIC (NOT AT [facility]) - Abnormal; Notable for the following:    Specific Gravity, Urine >1.030 (*)    Hgb urine dipstick MODERATE (*)    Bilirubin Urine SMALL (*)     Ketones, ur >80 (*)    Protein, ur 30 (*)    All other components within normal limits  URINE MICROSCOPIC-ADD ON - Abnormal; Notable for the following:    Squamous Epithelial / LPF MANY (*)    Bacteria, UA FEW (*)    Casts HYALINE CASTS (*)    All other components within normal limits  LIPASE, BLOOD    Imaging Review Ct Abdomen Pelvis W Contrast  04/11/2015   CLINICAL DATA:  Nausea, vomiting and diarrhea beginning 2 days ago. Some abdominal pain.  EXAM: CT ABDOMEN AND PELVIS WITH CONTRAST  TECHNIQUE: Multidetector CT imaging of the abdomen and pelvis was performed using the standard protocol following bolus administration of intravenous contrast.  CONTRAST:  20mL OMNIPAQUE IOHEXOL 300 MG/ML SOLN, 179mL OMNIPAQUE IOHEXOL 300 MG/ML SOLN  COMPARISON:  None.  FINDINGS: Lung bases:  Clear.  Heart normal in size.  Liver, spleen, pancreas, adrenal glands:  Unremarkable.  Gallbladder and biliary tree: Gallbladder surgically absent. Mild chronic dilation of the common bile duct with normal distal tapering.  Kidneys, ureters, bladder:  Normal.  Uterus and adnexa:  Unremarkable.  Lymph nodes:  No adenopathy.  Ascites:  None.  Gastrointestinal: Stomach, small bowel and colon are unremarkable. Normal appendix visualized.  Vascular: Atherosclerotic calcifications noted along the normal caliber infrarenal abdominal aorta and iliac arteries.  Musculoskeletal: Chronic pars defects at L5-S1 without malalignment. Degenerative facet changes most evident at L4-L5. No osteoblastic or osteolytic lesions.  IMPRESSION: 1. No acute findings. 2. No evidence of bowel obstruction or bowel inflammation. 3. Normal appendix visualized. 4. Status post cholecystectomy with mild chronic dilation of common bile duct. 5. Chronic bilateral pars defects at L5-S1.   Electronically Signed   By: Lajean Manes M.D.   On: 04/11/2015 18:28   I, Aurilla Coulibaly, personally reviewed and evaluated these images and lab results as part of my medical  decision-making.   EKG Interpretation None      MDM   Final diagnoses:  Abdominal pain    Patient still feels poorly after 2 L of IV fluids, pain management, IV Protonix. CT scan shows no acute findings. Admit to general medicine.    Nat Christen, MD 04/11/15 2222

## 2015-04-11 NOTE — H&P (Signed)
PCP:   Sallee Lange, MD   Chief Complaint:  N/v/d  HPI 54 yo female with 2 days of n/v/d and generalized abdominal pain.  Diarrhea and vomit nonbloody.  No fevers.  Unable to keep anything down.  No sob, no cp.  No swelling.  No sick contacts.  Feeling better with ivf and zofran in ED.  Referred for admission for her gi illness.  Review of Systems:  Positive and negative as per HPI otherwise all other systems are negative  Past Medical History: Past Medical History  Diagnosis Date  . Depression   . Migraine   . Hyperlipidemia   . ADHD (attention deficit hyperactivity disorder)   . Scoliosis     Teenage   Past Surgical History  Procedure Laterality Date  . Cholecystectomy      Medications: Prior to Admission medications   Medication Sig Start Date End Date Taking? Authorizing Provider  clonazePAM (KLONOPIN) 1 MG tablet TAKE ONE TABLET BY MOUTH TWICE DAILY 03/06/15  Yes Kathyrn Drown, MD  DUAVEE 0.45-20 MG TABS Take 1 tablet by mouth daily.  04/04/15  Yes Historical Provider, MD  lidocaine (LIDODERM) 5 % Place 1 patch onto the skin daily. Remove & Discard patch within 12 hours or as directed by MD 12/21/14  Yes Kathyrn Drown, MD  Naproxen Sodium (ALEVE) 220 MG CAPS Take 440 capsules by mouth daily.   Yes Historical Provider, MD  oxyCODONE-acetaminophen (PERCOCET) 7.5-325 MG per tablet Take 2 tablets by mouth every 6 (six) hours as needed.  03/28/15  Yes Historical Provider, MD  traZODone (DESYREL) 100 MG tablet TAKE ONE TABLET BY MOUTH ONCE DAILY 03/15/15  Yes Kathyrn Drown, MD  Venlafaxine HCl 75 MG TB24 TAKE THREE TABLETS BY MOUTH ONCE DAILY 04/04/15  Yes Kathyrn Drown, MD  phentermine 37.5 MG capsule Take 1 capsule (37.5 mg total) by mouth every morning. Patient not taking: Reported on 04/11/2015 12/21/14   Kathyrn Drown, MD    Allergies:   Allergies  Allergen Reactions  . Cymbalta [Duloxetine Hcl]     Aggressive   . Pravastatin     SE    Social History:  reports  that she has never smoked. She does not have any smokeless tobacco history on file. She reports that she does not drink alcohol. Her drug history is not on file.  Family History: Family History  Problem Relation Age of Onset  . Hypertension Father     Physical Exam: Filed Vitals:   04/11/15 2000 04/11/15 2030 04/11/15 2109 04/11/15 2230  BP: 134/85 137/81 120/77 126/69  Pulse: 85 84 79 73  Temp:    98.3 F (36.8 C)  TempSrc:    Oral  Resp:   18 19  Height:      Weight:      SpO2: 95% 97% 96% 100%   General appearance: alert, cooperative and no distress Head: Normocephalic, without obvious abnormality, atraumatic Eyes: negative Nose: Nares normal. Septum midline. Mucosa normal. No drainage or sinus tenderness. Neck: no JVD and supple, symmetrical, trachea midline Lungs: clear to auscultation bilaterally Heart: regular rate and rhythm, S1, S2 normal, no murmur, click, rub or gallop Abdomen: soft, non-tender; bowel sounds normal; no masses,  no organomegaly Extremities: extremities normal, atraumatic, no cyanosis or edema Pulses: 2+ and symmetric Skin: Skin color, texture, turgor normal. No rashes or lesions Neurologic: Grossly normal    Labs on Admission:   Recent Labs  04/11/15 1455  NA 138  K 3.2*  CL 100*  CO2 24  GLUCOSE 122*  BUN 24*  CREATININE 0.66  CALCIUM 9.8    Recent Labs  04/11/15 1455  AST 53*  ALT 92*  ALKPHOS 78  BILITOT 0.6  PROT 8.2*  ALBUMIN 4.8    Recent Labs  04/11/15 1455  LIPASE 25    Recent Labs  04/11/15 1455  WBC 12.7*  NEUTROABS 10.6*  HGB 14.4  HCT 42.7  MCV 89.3  PLT 398    Recent Labs  04/11/15 1455  TROPONINI <0.03    Radiological Exams on Admission: Ct Abdomen Pelvis W Contrast  04/11/2015   CLINICAL DATA:  Nausea, vomiting and diarrhea beginning 2 days ago. Some abdominal pain.  EXAM: CT ABDOMEN AND PELVIS WITH CONTRAST  TECHNIQUE: Multidetector CT imaging of the abdomen and pelvis was performed using  the standard protocol following bolus administration of intravenous contrast.  CONTRAST:  60mL OMNIPAQUE IOHEXOL 300 MG/ML SOLN, 132mL OMNIPAQUE IOHEXOL 300 MG/ML SOLN  COMPARISON:  None.  FINDINGS: Lung bases:  Clear.  Heart normal in size.  Liver, spleen, pancreas, adrenal glands:  Unremarkable.  Gallbladder and biliary tree: Gallbladder surgically absent. Mild chronic dilation of the common bile duct with normal distal tapering.  Kidneys, ureters, bladder:  Normal.  Uterus and adnexa:  Unremarkable.  Lymph nodes:  No adenopathy.  Ascites:  None.  Gastrointestinal: Stomach, small bowel and colon are unremarkable. Normal appendix visualized.  Vascular: Atherosclerotic calcifications noted along the normal caliber infrarenal abdominal aorta and iliac arteries.  Musculoskeletal: Chronic pars defects at L5-S1 without malalignment. Degenerative facet changes most evident at L4-L5. No osteoblastic or osteolytic lesions.  IMPRESSION: 1. No acute findings. 2. No evidence of bowel obstruction or bowel inflammation. 3. Normal appendix visualized. 4. Status post cholecystectomy with mild chronic dilation of common bile duct. 5. Chronic bilateral pars defects at L5-S1.   Electronically Signed   By: Lajean Manes M.D.   On: 04/11/2015 18:28    Assessment/Plan  54 yo female with acute viral gastroenteritis with mild dehydration  Principal Problem:   Gastroenteritis-  Supportive care.  obs on  Medical.  Ivf.  Prn zofran.  abd exam in benign.  Active Problems:   Abdominal pain- ct unrevealing.  Prn pain meds   Dehydration-  ivf   Hypokalemia- replete with ivf.  obs on medical bed.  Full code.    DAVID,RACHAL A 04/11/2015, 11:23 PM

## 2015-04-11 NOTE — ED Notes (Signed)
Pt states that she started with N/V/D on Tuesday , Abdo pain started first- Actively vomiting in triage

## 2015-04-11 NOTE — Telephone Encounter (Signed)
Patient had an appointment for today for vomiting.  Her spouse called and cancelled and said he was taking her to [facility] because she was very sick.  He wanted to be sure that Dr. Nicki Reaper knew this.

## 2015-04-12 DIAGNOSIS — E876 Hypokalemia: Secondary | ICD-10-CM | POA: Diagnosis not present

## 2015-04-12 DIAGNOSIS — E860 Dehydration: Secondary | ICD-10-CM

## 2015-04-12 DIAGNOSIS — K529 Noninfective gastroenteritis and colitis, unspecified: Principal | ICD-10-CM

## 2015-04-12 LAB — MAGNESIUM: Magnesium: 1.8 mg/dL (ref 1.7–2.4)

## 2015-04-12 LAB — BASIC METABOLIC PANEL
Anion gap: 6 (ref 5–15)
BUN: 16 mg/dL (ref 6–20)
CO2: 25 mmol/L (ref 22–32)
Calcium: 8.6 mg/dL — ABNORMAL LOW (ref 8.9–10.3)
Chloride: 109 mmol/L (ref 101–111)
Creatinine, Ser: 0.72 mg/dL (ref 0.44–1.00)
GFR calc Af Amer: >60 mL/min (ref >60)
GFR calc non Af Amer: >60 mL/min (ref >60)
Glucose, Bld: 90 mg/dL (ref 65–99)
Potassium: 3.2 mmol/L — ABNORMAL LOW (ref 3.5–5.1)
Sodium: 140 mmol/L (ref 135–145)

## 2015-04-12 LAB — CBC
HCT: 37 % (ref 36.0–46.0)
Hemoglobin: 12 g/dL (ref 12.0–15.0)
MCH: 29.8 pg (ref 26.0–34.0)
MCHC: 32.4 g/dL (ref 30.0–36.0)
MCV: 91.8 fL (ref 78.0–100.0)
Platelets: 301 10*3/uL (ref 150–400)
RBC: 4.03 MIL/uL (ref 3.87–5.11)
RDW: 13.6 % (ref 11.5–15.5)
WBC: 10.1 10*3/uL (ref 4.0–10.5)

## 2015-04-12 MED ORDER — CONJ ESTROGENS-BAZEDOXIFENE 0.45-20 MG PO TABS: 1.0000 | ORAL_TABLET | Freq: Every day | ORAL | Status: DC

## 2015-04-12 MED ORDER — TRAZODONE HCL 50 MG PO TABS
100.0000 mg | ORAL_TABLET | Freq: Every day | ORAL | Status: DC
  Administered 2015-04-12 – 2015-04-13 (×2): 100 mg via ORAL
  Filled 2015-04-12 (×2): qty 2

## 2015-04-12 MED ORDER — CLONAZEPAM 0.5 MG PO TABS
1.0000 mg | ORAL_TABLET | Freq: Two times a day (BID) | ORAL | Status: DC
  Administered 2015-04-12 – 2015-04-13 (×3): 1 mg via ORAL
  Filled 2015-04-12 (×3): qty 2

## 2015-04-12 MED ORDER — VENLAFAXINE HCL ER 75 MG PO TB24
225.0000 mg | ORAL_TABLET | Freq: Every day | ORAL | Status: DC
  Filled 2015-04-12 (×4): qty 3

## 2015-04-12 MED ORDER — VENLAFAXINE HCL ER 75 MG PO CP24
225.0000 mg | ORAL_CAPSULE | Freq: Every day | ORAL | Status: DC
  Administered 2015-04-12 – 2015-04-13 (×2): 225 mg via ORAL
  Filled 2015-04-12: qty 3

## 2015-04-12 MED ORDER — POTASSIUM CHLORIDE CRYS ER 20 MEQ PO TBCR
40.0000 meq | EXTENDED_RELEASE_TABLET | ORAL | Status: AC
  Administered 2015-04-12 (×2): 40 meq via ORAL
  Filled 2015-04-12 (×2): qty 2

## 2015-04-12 NOTE — Progress Notes (Signed)
[facility] PROGRESS NOTE  Cheryl Rowe VXB:[id number] DOB: [birth year] DOA: 04/11/2015 PCP: Sallee Lange, MD  Assessment/Plan: Acute viral gastroenteritis -CT A&P no acute findings  -Symptomatic management -2 episodes of diarrhea this am, no emesis since admission.  Dehydration -Secondary to gastroenteritis -Continue IVF   Hypokalemia  -Secondary to gastroenteritis -Replete orally -Check magnesium levels   Code Status: Full DVT prophylaxis: SCDs Family Communication: Mother at bedside Disposition Plan: Anticipate discharge in 24 hours  Consultants:  none  Procedures:  none  Antibiotics:  none  HPI/Subjective: Started vomiting and diarrhea onset three days ago. She reports that she stopped the phentermine 1 year ago. She denies any sick contact, any abx,and recent hospitalization. She has been unable to hold down food. No reports of chest pain, shortness of breath, or abd pain.  Objective: Filed Vitals:   04/12/15 0556  BP: 103/64  Pulse: 71  Temp: 98.1 F (36.7 C)  Resp: 20   No intake or output data in the 24 hours ending 04/12/15 0701 Filed Weights   04/11/15 1348 04/11/15 2230  Weight: 92.987 kg (205 lb) 91.309 kg (201 lb 4.8 oz)    Exam:  General:  NAD, appears calm and comfortable, lying in bed, afebrile Cardiovascular: RRR, no m/r/g Respiratory: CTAB, no w/r/r Abdomen: soft, positive bowel sounds, no distension Extremities: no LE edema Neurologic:  Non focal  Data Reviewed: Basic Metabolic Panel:  Recent Labs Lab 04/11/15 1455  NA 138  K 3.2*  CL 100*  CO2 24  GLUCOSE 122*  BUN 24*  CREATININE 0.66  CALCIUM 9.8   Liver Function Tests:  Recent Labs Lab 04/11/15 1455  AST 53*  ALT 92*  ALKPHOS 78  BILITOT 0.6  PROT 8.2*  ALBUMIN 4.8    Recent Labs Lab 04/11/15 1455  LIPASE 25   No results for input(s): AMMONIA in the last 168 hours. CBC:  Recent Labs Lab 04/11/15 1455 04/12/15 0623  WBC 12.7* 10.1   NEUTROABS 10.6*  --   HGB 14.4 12.0  HCT 42.7 37.0  MCV 89.3 91.8  PLT 398 301   Cardiac Enzymes:  Recent Labs Lab 04/11/15 1455  TROPONINI <0.03   BNP (last 3 results) No results for input(s): BNP in the last 8760 hours.  ProBNP (last 3 results) No results for input(s): PROBNP in the last 8760 hours.  CBG: No results for input(s): GLUCAP in the last 168 hours.  No results found for this or any previous visit (from the past 240 hour(s)).   Studies: Ct Abdomen Pelvis W Contrast  04/11/2015   CLINICAL DATA:  Nausea, vomiting and diarrhea beginning 2 days ago. Some abdominal pain.  EXAM: CT ABDOMEN AND PELVIS WITH CONTRAST  TECHNIQUE: Multidetector CT imaging of the abdomen and pelvis was performed using the standard protocol following bolus administration of intravenous contrast.  CONTRAST:  7mL OMNIPAQUE IOHEXOL 300 MG/ML SOLN, 161mL OMNIPAQUE IOHEXOL 300 MG/ML SOLN  COMPARISON:  None.  FINDINGS: Lung bases:  Clear.  Heart normal in size.  Liver, spleen, pancreas, adrenal glands:  Unremarkable.  Gallbladder and biliary tree: Gallbladder surgically absent. Mild chronic dilation of the common bile duct with normal distal tapering.  Kidneys, ureters, bladder:  Normal.  Uterus and adnexa:  Unremarkable.  Lymph nodes:  No adenopathy.  Ascites:  None.  Gastrointestinal: Stomach, small bowel and colon are unremarkable. Normal appendix visualized.  Vascular: Atherosclerotic calcifications noted along the normal caliber infrarenal abdominal aorta and iliac arteries.  Musculoskeletal: Chronic pars defects at L5-S1  without malalignment. Degenerative facet changes most evident at L4-L5. No osteoblastic or osteolytic lesions.  IMPRESSION: 1. No acute findings. 2. No evidence of bowel obstruction or bowel inflammation. 3. Normal appendix visualized. 4. Status post cholecystectomy with mild chronic dilation of common bile duct. 5. Chronic bilateral pars defects at L5-S1.   Electronically Signed   By:  Lajean Manes M.D.   On: 04/11/2015 18:28    Scheduled Meds: . feeding supplement (ENSURE ENLIVE)  237 mL Oral BID BM   Continuous Infusions: . 0.9 % NaCl with KCl 20 mEq / L 125 mL/hr at 04/11/15 2359    Principal Problem:   Gastroenteritis Active Problems:   Abdominal pain   Dehydration   Hypokalemia    Time spent:30 minutes. Greater than 50% of this time was spent in direct contact with the patient coordinating care.    Domingo Mend, MD  [facility] Pager [phone], please contact night-coverage at [email or web address], password [facility] 04/12/2015, 7:01 AM      I, Laban Emperor. Leonie Green, acting as scribe, recorded this note contemporaneously in the presence of Dr. Lelon Frohlich, M.D. on 04/12/2015.   I have reviewed the above documentation for accuracy and completeness, and I agree with the above.  Domingo Mend, MD [facility] Pager: [phone]

## 2015-04-12 NOTE — Progress Notes (Signed)
Patient stated that she has home meds that she has not taken in a couple of days because they have not been started here in the hospital.  Patient states she feels shaky and believes it is because she has not had those meds.  Dr. Jerilee Hoh was notified.  Awaiting to see if she wants them started.

## 2015-04-12 NOTE — Progress Notes (Addendum)
Nutrition Brief Note  Patient identified on the Malnutrition Screening Tool (MST) Report. She presents with gastroenteritis.   Wt Readings from Last 15 Encounters:  04/11/15 201 lb 4.8 oz (91.309 kg)  12/21/14 196 lb 12.8 oz (89.268 kg)  09/28/14 202 lb 12.8 oz (91.989 kg)  08/27/14 204 lb (92.534 kg)  03/22/14 198 lb (89.812 kg)  03/09/14 197 lb 4 oz (89.472 kg)  01/26/14 201 lb 12.8 oz (91.536 kg)  11/20/13 190 lb 12.8 oz (86.546 kg)  08/03/13 166 lb (75.297 kg)  06/30/13 167 lb 9.6 oz (76.023 kg)  04/18/13 173 lb (78.472 kg)  01/17/13 179 lb 8 oz (81.421 kg)  12/28/12 175 lb 2 oz (79.436 kg)    Body mass index is 32.51 kg/(m^2). Patient meets criteria for obesity class I based on current BMI. Weight gain trend over the past 2+ years.  Pt is tolerating diet advancement. No further episodes of vomiting. Current diet order is full liquids and  patient is consuming approximately (no data)% of meals at this time.   Labs and medications reviewed. Nutrition focused exam: WDL.  No nutrition interventions warranted at this time. If nutrition issues arise, please consult RD.   Colman Cater MS,RD,CSG,LDN Office: [phone] Pager: [phone]

## 2015-04-13 DIAGNOSIS — K529 Noninfective gastroenteritis and colitis, unspecified: Secondary | ICD-10-CM | POA: Diagnosis not present

## 2015-04-13 DIAGNOSIS — E860 Dehydration: Secondary | ICD-10-CM | POA: Diagnosis not present

## 2015-04-13 DIAGNOSIS — E876 Hypokalemia: Secondary | ICD-10-CM | POA: Diagnosis not present

## 2015-04-13 LAB — CBC
HCT: 36.4 % (ref 36.0–46.0)
Hemoglobin: 11.5 g/dL — ABNORMAL LOW (ref 12.0–15.0)
MCH: 29.3 pg (ref 26.0–34.0)
MCHC: 31.6 g/dL (ref 30.0–36.0)
MCV: 92.9 fL (ref 78.0–100.0)
Platelets: 286 10*3/uL (ref 150–400)
RBC: 3.92 MIL/uL (ref 3.87–5.11)
RDW: 13.6 % (ref 11.5–15.5)
WBC: 7.6 10*3/uL (ref 4.0–10.5)

## 2015-04-13 LAB — BASIC METABOLIC PANEL
Anion gap: 4 — ABNORMAL LOW (ref 5–15)
BUN: 8 mg/dL (ref 6–20)
CO2: 26 mmol/L (ref 22–32)
Calcium: 8.5 mg/dL — ABNORMAL LOW (ref 8.9–10.3)
Chloride: 109 mmol/L (ref 101–111)
Creatinine, Ser: 0.6 mg/dL (ref 0.44–1.00)
GFR calc Af Amer: >60 mL/min (ref >60)
GFR calc non Af Amer: >60 mL/min (ref >60)
Glucose, Bld: 93 mg/dL (ref 65–99)
Potassium: 3.9 mmol/L (ref 3.5–5.1)
Sodium: 139 mmol/L (ref 135–145)

## 2015-04-13 NOTE — Discharge Summary (Signed)
Physician Discharge Summary  Cheryl Rowe JSE:[id number] DOB: 03/05/1961 DOA: 04/11/2015  PCP: Sallee Lange, MD  Admit date: 04/11/2015 Discharge date: 04/13/2015  Time spent: 35 minutes  Recommendations for Outpatient Follow-up:  1. Follow up with PCP in 1-2 weeks.   Discharge Diagnoses:  Principal Problem:   Gastroenteritis Active Problems:   Abdominal pain   Dehydration   Hypokalemia   Discharge Condition: Improved  Diet recommendation: Regular   Filed Weights   04/11/15 1348 04/11/15 2230  Weight: 92.987 kg (205 lb) 91.309 kg (201 lb 4.8 oz)    History of present illness:  54 yo female with 2 days of n/v/d and generalized abdominal pain. Diarrhea and vomit nonbloody.No fevers.Unable to keep anything down.No SOB or CP. No swelling. No sick contacts.Feeling better with ivf and zofran in ED. Referred for admission for her GI illness.  Hospital Course:   Acute viral gastroenteritis -CT A&P no acute findings  -Symptomatic management - No emesis since admission and diarrhea has improved.  Dehydration -Secondary to gastroenteritis  -Resolved, with IVF  Hypokalemia -Secondary to gastroenteritis -Resolved with repletion.  Procedures:  none  Consultations:  none   Discharge Instructions   Discharge Instructions    Increase activity slowly    Complete by:  As directed           Current Discharge Medication List    CONTINUE these medications which have NOT CHANGED   Details  clonazePAM (KLONOPIN) 1 MG tablet TAKE ONE TABLET BY MOUTH TWICE DAILY Qty: 60 tablet, Refills: 4    DUAVEE 0.45-20 MG TABS Take 1 tablet by mouth daily.     lidocaine (LIDODERM) 5 % Place 1 patch onto the skin daily. Remove & Discard patch within 12 hours or as directed by MD Qty: 30 patch, Refills: 2    oxyCODONE-acetaminophen (PERCOCET) 7.5-325 MG per tablet Take 2 tablets by mouth every 6 (six) hours as needed.     traZODone (DESYREL) 100 MG tablet TAKE ONE TABLET  BY MOUTH ONCE DAILY Qty: 30 tablet, Refills: 0    Venlafaxine HCl 75 MG TB24 TAKE THREE TABLETS BY MOUTH ONCE DAILY Qty: 270 tablet, Refills: 0      STOP taking these medications     Naproxen Sodium (ALEVE) 220 MG CAPS      phentermine 37.5 MG capsule        Allergies  Allergen Reactions  . Cymbalta [Duloxetine Hcl]     Aggressive   . Pravastatin     SE   Follow-up Information    Follow up with Sallee Lange, MD. Schedule an appointment as soon as possible for a visit in 2 weeks.   Specialty:  Family Medicine   Contact information:   [address] [place] [address] [phone]        The results of significant diagnostics from this hospitalization (including imaging, microbiology, ancillary and laboratory) are listed below for reference.    Significant Diagnostic Studies: Ct Abdomen Pelvis W Contrast  04/11/2015   CLINICAL DATA:  Nausea, vomiting and diarrhea beginning 2 days ago. Some abdominal pain.  EXAM: CT ABDOMEN AND PELVIS WITH CONTRAST  TECHNIQUE: Multidetector CT imaging of the abdomen and pelvis was performed using the standard protocol following bolus administration of intravenous contrast.  CONTRAST:  37mL OMNIPAQUE IOHEXOL 300 MG/ML SOLN, 170mL OMNIPAQUE IOHEXOL 300 MG/ML SOLN  COMPARISON:  None.  FINDINGS: Lung bases:  Clear.  Heart normal in size.  Liver, spleen, pancreas, adrenal glands:  Unremarkable.  Gallbladder and biliary tree: Gallbladder surgically absent. Mild chronic dilation of the common bile duct with normal distal tapering.  Kidneys, ureters, bladder:  Normal.  Uterus and adnexa:  Unremarkable.  Lymph nodes:  No adenopathy.  Ascites:  None.  Gastrointestinal: Stomach, small bowel and colon are unremarkable. Normal appendix visualized.  Vascular: Atherosclerotic calcifications noted along the normal caliber infrarenal abdominal aorta and iliac arteries.  Musculoskeletal: Chronic pars defects at L5-S1 without malalignment. Degenerative  facet changes most evident at L4-L5. No osteoblastic or osteolytic lesions.  IMPRESSION: 1. No acute findings. 2. No evidence of bowel obstruction or bowel inflammation. 3. Normal appendix visualized. 4. Status post cholecystectomy with mild chronic dilation of common bile duct. 5. Chronic bilateral pars defects at L5-S1.   Electronically Signed   By: Lajean Manes M.D.   On: 04/11/2015 18:28    Labs: Basic Metabolic Panel:  Recent Labs Lab 04/11/15 1455 04/12/15 0623 04/13/15 0601  NA 138 140 139  K 3.2* 3.2* 3.9  CL 100* 109 109  CO2 24 25 26   GLUCOSE 122* 90 93  BUN 24* 16 8  CREATININE 0.66 0.72 0.60  CALCIUM 9.8 8.6* 8.5*  MG  --  1.8  --    Liver Function Tests:  Recent Labs Lab 04/11/15 1455  AST 53*  ALT 92*  ALKPHOS 78  BILITOT 0.6  PROT 8.2*  ALBUMIN 4.8    Recent Labs Lab 04/11/15 1455  LIPASE 25   CBC:  Recent Labs Lab 04/11/15 1455 04/12/15 0623 04/13/15 0601  WBC 12.7* 10.1 7.6  NEUTROABS 10.6*  --   --   HGB 14.4 12.0 11.5*  HCT 42.7 37.0 36.4  MCV 89.3 91.8 92.9  PLT 398 301 286   Cardiac Enzymes:  Recent Labs Lab 04/11/15 1455  TROPONINI <0.03      Signed:  Domingo Mend, MD  [facility] 04/13/2015, 9:50 AM   I, Laban Emperor. Leonie Green, acting as scribe, recorded this note contemporaneously in the presence of Dr. Lelon Frohlich, M.D. on 04/13/2015.   I have reviewed the above documentation for accuracy and completeness, and I agree with the above.  Domingo Mend, MD [facility] Pager: [phone]

## 2015-04-13 NOTE — Progress Notes (Signed)
Patient states understanding of discharge instructions.  

## 2015-04-14 NOTE — Telephone Encounter (Signed)
Patient was admitted for gastroenteritis

## 2015-04-15 ENCOUNTER — Other Ambulatory Visit: Payer: Self-pay | Admitting: Family Medicine

## 2015-04-19 ENCOUNTER — Other Ambulatory Visit: Payer: Self-pay | Admitting: *Deleted

## 2015-04-22 ENCOUNTER — Other Ambulatory Visit: Payer: Self-pay | Admitting: *Deleted

## 2015-04-23 ENCOUNTER — Encounter: Payer: Self-pay | Admitting: Family Medicine

## 2015-04-23 ENCOUNTER — Ambulatory Visit (INDEPENDENT_AMBULATORY_CARE_PROVIDER_SITE_OTHER): Payer: BLUE CROSS/BLUE SHIELD | Admitting: Family Medicine

## 2015-04-23 VITALS — BP 134/82 | Ht 66.0 in | Wt 202.5 lb

## 2015-04-23 DIAGNOSIS — K2990 Gastroduodenitis, unspecified, without bleeding: Secondary | ICD-10-CM

## 2015-04-23 DIAGNOSIS — E876 Hypokalemia: Secondary | ICD-10-CM

## 2015-04-23 DIAGNOSIS — R740 Nonspecific elevation of levels of transaminase and lactic acid dehydrogenase [LDH]: Secondary | ICD-10-CM | POA: Diagnosis not present

## 2015-04-23 DIAGNOSIS — K2970 Gastritis, unspecified, without bleeding: Secondary | ICD-10-CM

## 2015-04-23 DIAGNOSIS — R7401 Elevation of levels of liver transaminase levels: Secondary | ICD-10-CM

## 2015-04-23 MED ORDER — PANTOPRAZOLE SODIUM 40 MG PO TBEC: 40.0000 mg | DELAYED_RELEASE_TABLET | Freq: Every day | ORAL | Status: DC

## 2015-04-23 MED ORDER — TRAZODONE HCL 100 MG PO TABS: 100.0000 mg | ORAL_TABLET | Freq: Every day | ORAL | Status: DC

## 2015-04-23 NOTE — Progress Notes (Signed)
   Subjective:    Patient ID: Cheryl Rowe, female    DOB: [birth year], 54 y.o.   MRN: [id number]  Abdominal Pain This is a new problem. The current episode started in the past 7 days. The onset quality is gradual. The problem has been gradually improving. The pain is located in the generalized abdominal region. The patient is experiencing no pain. Quality: Burning  Associated symptoms include nausea. Pertinent negatives include no fever. Nothing aggravates the pain. She has tried antacids for the symptoms. The treatment provided mild relief.   Patient in today for a hospital follow up from 04/11/2015 at [facility]. Patient states that abdominal pain has subsided but she is still experiencing a burning like sensation from stomach up into esophagus that she believes may be acid reflux. Patient states that she has been taking antacids with little to no relief. Patient also states that she needs refill on trazodone.   Patient uses trazodone at nighttime. She also takes an and presence during the day taking 2 of the Effexor's daily. Review of Systems  Constitutional: Negative for fever and fatigue.  HENT: Negative for congestion.   Respiratory: Negative for cough and choking.   Cardiovascular: Negative for chest pain.  Gastrointestinal: Positive for nausea and abdominal pain. Negative for abdominal distention.   Hospital notes were reviewed.    Objective:   Physical Exam  Constitutional: She appears well-nourished. No distress.  Cardiovascular: Normal rate, regular rhythm and normal heart sounds.   No murmur heard. Pulmonary/Chest: Effort normal and breath sounds normal. No respiratory distress.  Abdominal: Soft. She exhibits no distension. There is no tenderness. There is no rebound.  Musculoskeletal: She exhibits no edema.  Lymphadenopathy:    She has no cervical adenopathy.  Neurological: She is alert. She exhibits normal muscle tone.  Psychiatric: Her behavior is normal.    Vitals reviewed.         Assessment & Plan:  She did have low potassium and low calcium and elevated liver enzymes while in the hospital we will recheck those Reflux with esophagitis symptoms treated with PPI over the next 2 months recheck in 2 months hopefully be able to stop PPI at that point  Gastroenteritis resolved.

## 2015-04-24 LAB — HEPATIC FUNCTION PANEL
ALT: 21 IU/L (ref 0–32)
AST: 26 IU/L (ref 0–40)
Albumin: 4.8 g/dL (ref 3.5–5.5)
Alkaline Phosphatase: 99 IU/L (ref 39–117)
Bilirubin Total: <0.2 mg/dL (ref 0.0–1.2)
Bilirubin, Direct: 0.05 mg/dL (ref 0.00–0.40)
Total Protein: 7.3 g/dL (ref 6.0–8.5)

## 2015-04-24 LAB — BASIC METABOLIC PANEL
BUN/Creatinine Ratio: 21 (ref 9–23)
BUN: 18 mg/dL (ref 6–24)
CO2: 26 mmol/L (ref 18–29)
Calcium: 10.2 mg/dL (ref 8.7–10.2)
Chloride: 97 mmol/L (ref 97–108)
Creatinine, Ser: 0.84 mg/dL (ref 0.57–1.00)
GFR calc Af Amer: 92 mL/min/{1.73_m2} (ref >59)
GFR calc non Af Amer: 80 mL/min/{1.73_m2} (ref >59)
Glucose: 81 mg/dL (ref 65–99)
Potassium: 5.2 mmol/L (ref 3.5–5.2)
Sodium: 141 mmol/L (ref 134–144)

## 2015-06-25 ENCOUNTER — Ambulatory Visit: Payer: BLUE CROSS/BLUE SHIELD | Admitting: Family Medicine

## 2015-07-15 ENCOUNTER — Ambulatory Visit (INDEPENDENT_AMBULATORY_CARE_PROVIDER_SITE_OTHER): Payer: BLUE CROSS/BLUE SHIELD | Admitting: Family Medicine

## 2015-07-15 ENCOUNTER — Encounter: Payer: Self-pay | Admitting: Family Medicine

## 2015-07-15 VITALS — BP 128/70 | Ht 66.0 in | Wt 212.0 lb

## 2015-07-15 DIAGNOSIS — K219 Gastro-esophageal reflux disease without esophagitis: Secondary | ICD-10-CM | POA: Insufficient documentation

## 2015-07-15 DIAGNOSIS — Z13220 Encounter for screening for lipoid disorders: Secondary | ICD-10-CM | POA: Diagnosis not present

## 2015-07-15 DIAGNOSIS — F32A Depression, unspecified: Secondary | ICD-10-CM

## 2015-07-15 DIAGNOSIS — F329 Major depressive disorder, single episode, unspecified: Secondary | ICD-10-CM

## 2015-07-15 DIAGNOSIS — E785 Hyperlipidemia, unspecified: Secondary | ICD-10-CM | POA: Diagnosis not present

## 2015-07-15 MED ORDER — VENLAFAXINE HCL ER 75 MG PO TB24: 2.0000 | ORAL_TABLET | Freq: Every day | ORAL | Status: DC

## 2015-07-15 MED ORDER — PANTOPRAZOLE SODIUM 40 MG PO TBEC: 40.0000 mg | DELAYED_RELEASE_TABLET | Freq: Every day | ORAL | Status: DC

## 2015-07-15 MED ORDER — TRAZODONE HCL 100 MG PO TABS: 100.0000 mg | ORAL_TABLET | Freq: Every day | ORAL | Status: DC

## 2015-07-15 NOTE — Progress Notes (Signed)
   Subjective:    Patient ID: Cheryl Rowe, female    DOB: [birth year], 54 y.o.   MRN: [id number]  Gastroesophageal Reflux This is a recurrent problem. The current episode started more than 1 month ago. The problem has been gradually improving. Nothing aggravates the symptoms. There are no known risk factors. Treatments tried: Protonix. The treatment provided significant relief.   Patient needs a refill on her trazodone.  Patient states her moods overall doing well taken her medicines as directed  Review of Systems Patient denies any chest tightness pressure pain shortness breath    Objective:   Physical Exam  Lungs clear heart regular pulse normal BP good lungs clear hearts regular extremities no edema Assessment & Plan:  Overall patient is stable. I did advise the patient to continue trazodone for sleep In addition to this uses nerve medication but she will be tapering off of this plus also patient is trying to taper down Effexor but overall doing well Her reflexes been under very good control recently she will try to taper off of her medicine as long as reflux stays away we talked about lifestyle changes to help with reflux follow-up in 6 months

## 2015-08-02 ENCOUNTER — Other Ambulatory Visit: Payer: Self-pay

## 2015-08-02 ENCOUNTER — Other Ambulatory Visit: Payer: Self-pay | Admitting: Family Medicine

## 2015-08-02 NOTE — Telephone Encounter (Signed)
May have this in 3 refills 

## 2015-08-15 LAB — LIPID PANEL
Chol/HDL Ratio: 4.8 ratio units — ABNORMAL HIGH (ref 0.0–4.4)
Cholesterol, Total: 257 mg/dL — ABNORMAL HIGH (ref 100–199)
HDL: 54 mg/dL (ref >39)
LDL Calculated: 159 mg/dL — ABNORMAL HIGH (ref 0–99)
Triglycerides: 222 mg/dL — ABNORMAL HIGH (ref 0–149)
VLDL Cholesterol Cal: 44 mg/dL — ABNORMAL HIGH (ref 5–40)

## 2015-10-01 ENCOUNTER — Other Ambulatory Visit: Payer: Self-pay | Admitting: Family Medicine

## 2015-10-01 NOTE — Telephone Encounter (Signed)
Patient may have this in 2 refills.

## 2015-10-21 ENCOUNTER — Ambulatory Visit (INDEPENDENT_AMBULATORY_CARE_PROVIDER_SITE_OTHER): Payer: BLUE CROSS/BLUE SHIELD | Admitting: Family Medicine

## 2015-10-21 ENCOUNTER — Encounter: Payer: Self-pay | Admitting: Family Medicine

## 2015-10-21 VITALS — BP 138/86 | Ht 66.0 in | Wt 195.8 lb

## 2015-10-21 DIAGNOSIS — M545 Low back pain, unspecified: Secondary | ICD-10-CM

## 2015-10-21 DIAGNOSIS — F329 Major depressive disorder, single episode, unspecified: Secondary | ICD-10-CM | POA: Diagnosis not present

## 2015-10-21 DIAGNOSIS — G8929 Other chronic pain: Secondary | ICD-10-CM | POA: Diagnosis not present

## 2015-10-21 DIAGNOSIS — F32A Depression, unspecified: Secondary | ICD-10-CM

## 2015-10-21 MED ORDER — VENLAFAXINE HCL ER 75 MG PO TB24: ORAL_TABLET | ORAL | Status: DC

## 2015-10-21 MED ORDER — HYDROCODONE-ACETAMINOPHEN 10-325 MG PO TABS: 1.0000 | ORAL_TABLET | ORAL | Status: DC | PRN

## 2015-10-21 NOTE — Patient Instructions (Addendum)
Hydrocodone 10mg /325mg   Use 1/2 to 1 every  4 hours as needed ( try to stick with no more than 4 per day) The pain med is not a bedtime pain pill ( to lessen the risk of drug interaction/overdose) Caution drowsiness Also you may need a stool softner as needed  Increase the Effexor to 3 per day  Lets recheck here in 2 weeks

## 2015-10-21 NOTE — Progress Notes (Signed)
   Subjective:    Patient ID: Cheryl Rowe, female    DOB: 07-Feb-1961, 55 y.o.   MRN: [id number]  HPI  Patient arrives with problems with depression since the beginning of the year  patient states because of her back pain she is been stuck in the house she is unable to do any activities this is getting her down in getting her depressed. Patient was hopeful that her back would have surgery but she states the surgeon told her that because of her scoliosis he could not help her she is going to see is different neurosurgeon in [place] for a second opinion. She will see Dr. Ellene Route. In addition to this patient his been struggling with depression but she states if she could get her back pain doing better she would not be depressed she was on Percocets but she does not want to be on them any longer but being on ibuprofen alone is not enough. Patient states at times she feels down she's even had an occasional time  She is been in despair but she denies being suicidal. She states if the pain could get better she thinks she would feel better she is hopeful the neurosurgeon will help her Review of Systems  see above. Pain into both hips worse on the left and right pain into the thigh regions patient has had an MRI in the past has had x-rays. Denies and chest tightness pressure pain shortness breath    Objective:   Physical Exam   low back pain and discomfort to palpation worse on the left than the right negative straight leg raise but has decreased range of motion. Lungs clear hearts regular. Patient tearful.  25 minutes was spent with the patient. Greater than half the time was spent in discussion and answering questions and counseling regarding the issues that the patient came in for today.  patient was encouraged to follow-up if she is getting worse otherwise follow-up in 2 weeks patient was clearly told to not exceed 4 pain pills per day she was given 60 tablets this should be enough to last her when  she comes back she was told to start off with just a half a tablet 4 times a day if that does not do enough increase until full tablet  She was also instructed that counseling would be beneficial for her at first she felt that her husband would be mad about this but I told her that it is for her own good therefore we will go ahead and set her up.    Assessment & Plan:   depression made worse by her chronic low back pain  Increase Effexor Recommend psychology counseling referral Patient not suicidal she was told that if she starts feeling that way immediately rechecked here or ER  chronic pain with lower back getting second opinion from neurosurgery hopefully they can help her   pain medication prescribed half tablet to a whole tablet 4 times a day caution drowsiness #60 with follow-up in 2 weeks

## 2015-10-24 ENCOUNTER — Telehealth: Payer: Self-pay | Admitting: Family Medicine

## 2015-10-24 NOTE — Telephone Encounter (Signed)
Seen 2/20 for back pain. Wants to go back to percocet. Pt aware you are out of office until tomorrow. Please call pt back on this number [phone] and not the number left on message.

## 2015-10-24 NOTE — Telephone Encounter (Signed)
Pt called stating that the medication that she was recently prescribed is not helping at all and would like something else. The pt states that she has an appt week after next and can bring the others in at that time.

## 2015-10-25 NOTE — Telephone Encounter (Signed)
If she has hydrocodone 10 mg she needs to use as one every 4 hours when those are completed then I will be willing to go to Percocet 10 mg/325. But I am concerned that giving her Percocet while also having hydrocodone at home would not be in her best interest

## 2015-10-25 NOTE — Telephone Encounter (Signed)
Discussed with pt. Pt verbalized understanding.  °

## 2015-10-30 ENCOUNTER — Telehealth: Payer: Self-pay | Admitting: Family Medicine

## 2015-10-30 MED ORDER — HYDROCODONE-ACETAMINOPHEN 10-325 MG PO TABS: 1.0000 | ORAL_TABLET | ORAL | Status: DC | PRN

## 2015-10-30 NOTE — Telephone Encounter (Signed)
Notified patient Dr. Nicki Reaper does not feel comfortable increasing to Percocet at this point he feels it is in her best option to get a second opinion regarding the neurosurgery stick with this pain medication she can take a maximum of 6 per day if she needs to have a refill he will be happy to provide a new prescription. Hydrocodone 10 mg/325 mg one every 4 hours when necessary pain no greater than 6 per day #90. Patient agreed and will pick up script. Patient states that her appointment with Dr. Ellene Route is for March 16.

## 2015-10-30 NOTE — Telephone Encounter (Signed)
I do not feel comfortable increasing to Percocet at this point I feel it is in her best option to get a second opinion regarding the neurosurgery stick with this pain medication she can take a maximum of 6 per day if she needs to have a refill I will be happy to provide a new prescription. Hydrocodone 10 mg/325 mg one every 4 hours when necessary pain no greater than 6 per day #90-please check with referral people to find out when she might be seen the neurosurgeon for a second opinion I believe she was trying to get an appointment with Dr. Ellene Route please check it out

## 2015-10-30 NOTE — Telephone Encounter (Signed)
Pt is requesting a refill on her pain meds and would also like to know if it can be changes to percocet.

## 2015-10-31 ENCOUNTER — Encounter: Payer: Self-pay | Admitting: Family Medicine

## 2015-11-04 ENCOUNTER — Ambulatory Visit (INDEPENDENT_AMBULATORY_CARE_PROVIDER_SITE_OTHER): Payer: BLUE CROSS/BLUE SHIELD | Admitting: Family Medicine

## 2015-11-04 ENCOUNTER — Encounter: Payer: Self-pay | Admitting: Family Medicine

## 2015-11-04 VITALS — BP 120/80 | Ht 66.0 in | Wt 203.0 lb

## 2015-11-04 DIAGNOSIS — G8929 Other chronic pain: Secondary | ICD-10-CM

## 2015-11-04 DIAGNOSIS — M545 Low back pain, unspecified: Secondary | ICD-10-CM

## 2015-11-04 MED ORDER — MELOXICAM 15 MG PO TABS: 15.0000 mg | ORAL_TABLET | Freq: Every day | ORAL | Status: DC

## 2015-11-04 NOTE — Progress Notes (Signed)
   Subjective:    Patient ID: Cheryl Rowe, female    DOB: 03-25-1961, 55 y.o.   MRN: [id number]  HPI Patient is here today for a follow up visit on depression due to chronic back pain. Patient states that she is still suffering with the depression.  Patient has no new concerns at this time.   Patient states she's not suicidal. She just finds herself feeling really down because her back is bothering her. She states at one time she was told she needed to have surgery but then recently a different surgeon backed away from this and recommended for her to go to [place]. She will be getting the opinion of a neurosurgeon in [place], Dr. Ellene Route Review of Systems     Objective:   Physical Exam Subjective low back pain with positive straight leg raise. Lungs clear heart regular Patient tearful.      Assessment & Plan:  Patient with severe low back pain discomfort. Radiates into both hips. Has an appointment with Dr. Ellene Route coming up. We will discuss with Dr. Clarice Pole office if they want up-to-date MRI or just leave things as is until her appointment on March 16.  Depression stable. Continue current dosing.  Chronic pain related to her back stick with hydrocodone every 4 hours when necessary severe pain caution drowsiness add meloxicam 1 daily. In regards to your pain medication I recommend that her husband set out her medicine, no greater than 6 pain pills per day. If needing additional prescription to contact us. Follow-up within 6 weeks

## 2015-11-05 NOTE — Progress Notes (Signed)
Called Dr.Elsner office, and was told that it is not required but it is preferred for patient to have updated MRI, and or Xray prior to coming to office visit.

## 2015-11-22 ENCOUNTER — Other Ambulatory Visit: Payer: Self-pay | Admitting: Family Medicine

## 2015-11-25 ENCOUNTER — Telehealth: Payer: Self-pay | Admitting: Family Medicine

## 2015-11-25 MED ORDER — HYDROCODONE-ACETAMINOPHEN 10-325 MG PO TABS: 1.0000 | ORAL_TABLET | ORAL | Status: DC | PRN

## 2015-11-25 NOTE — Telephone Encounter (Signed)
HYDROcodone-acetaminophen (NORCO) 10-325 MG tablet  Pt wants to pick up from [place] tomorrow if possible coming back from [place] Please date it for this Friday. She lives in [place] and will be coming through here tomorrow Trying to save a trip

## 2015-11-25 NOTE — Telephone Encounter (Signed)
Left message on voicemail notifying patient that script is ready for pickup.  

## 2015-11-25 NOTE — Telephone Encounter (Signed)
May have refill of medication, #180 tablets, as requested

## 2015-11-28 ENCOUNTER — Ambulatory Visit (HOSPITAL_COMMUNITY): Payer: Self-pay | Admitting: Psychology

## 2015-12-02 ENCOUNTER — Other Ambulatory Visit: Payer: Self-pay | Admitting: Family Medicine

## 2015-12-02 NOTE — Telephone Encounter (Signed)
May refill this +3 additional months, not to take with pain medication

## 2015-12-04 ENCOUNTER — Ambulatory Visit (INDEPENDENT_AMBULATORY_CARE_PROVIDER_SITE_OTHER): Payer: BLUE CROSS/BLUE SHIELD | Admitting: Family Medicine

## 2015-12-04 ENCOUNTER — Encounter: Payer: Self-pay | Admitting: Family Medicine

## 2015-12-04 VITALS — BP 132/82 | Ht 66.0 in | Wt 203.0 lb

## 2015-12-04 DIAGNOSIS — M545 Low back pain, unspecified: Secondary | ICD-10-CM

## 2015-12-04 DIAGNOSIS — G8929 Other chronic pain: Secondary | ICD-10-CM

## 2015-12-04 MED ORDER — CLONAZEPAM 0.5 MG PO TABS: 0.5000 mg | ORAL_TABLET | Freq: Two times a day (BID) | ORAL | Status: DC | PRN

## 2015-12-04 MED ORDER — OXYCODONE-ACETAMINOPHEN 10-325 MG PO TABS: 1.0000 | ORAL_TABLET | ORAL | Status: DC | PRN

## 2015-12-04 NOTE — Patient Instructions (Signed)
We can change your pain med Please bring in old med and we can give new script Oxycodone 10/325, 1 every 4 hours as needed no greater thasn 5 per day Also you would have to decrease the Klonopin to 0.5 mg one 2 times a day  Obviously if this new pain med caused drowsiness you would have to go back to the other

## 2015-12-04 NOTE — Progress Notes (Signed)
   Subjective:    Patient ID: Cheryl Rowe, female    DOB: 04-29-1961, 55 y.o.   MRN: [id number]  HPI  Patient arrives for a follow up on back pain -saw neurosurgeon and had epidural injection but it only helped for 4 days. Patient states pain is still severe she would like to try stronger pain medicine. She states pain keeps her awake at night and also makes miserable during the day in addition to this she states that the back surgeon told her that she had severe scoliosis and stated if she was going to have any surgery would be rods in her back but she does not know if she wants to have that surgery. Review of Systems She denies any breathing troubles nausea vomiting diarrhea no fever chills    Objective:   Physical Exam Lungs clear heart regular subjected discomfort lower back with increased discomfort on the left side       Assessment & Plan:  Severe scoliosis with pending surgical opinion/follow-up  Chronic low back pain with sciatica down the left side as well as scoliosis pain. We will go ahead in issue medication for oxycodone/Percocet 10 mg/325 mg one every 4 hours when necessary pain maximum 5 per day #150 the patient will bring back her other prescription of hydrocodone in exchange for this prescription. She is aware that this medicine could cause drowsiness and if it does she would need to stop the medicine she is taking this medicine before without difficulty. Patient was counseled that using number medication with pain medicine does increase her risk of accidental overdose and death. She was warned not to take the 2 together at bedtime. She was counseled to reduce Klonopin maximum 0.5 mg twice a day when necessary not to use at bedtime patient is also counseled to follow-up in 3-4 weeks to see how this is doing

## 2016-01-01 ENCOUNTER — Encounter: Payer: Self-pay | Admitting: Family Medicine

## 2016-01-01 ENCOUNTER — Ambulatory Visit (INDEPENDENT_AMBULATORY_CARE_PROVIDER_SITE_OTHER): Payer: BLUE CROSS/BLUE SHIELD | Admitting: Family Medicine

## 2016-01-01 VITALS — BP 112/80 | Ht 66.0 in | Wt 207.2 lb

## 2016-01-01 DIAGNOSIS — G8929 Other chronic pain: Secondary | ICD-10-CM

## 2016-01-01 DIAGNOSIS — M545 Low back pain, unspecified: Secondary | ICD-10-CM

## 2016-01-01 MED ORDER — OXYCODONE-ACETAMINOPHEN 10-325 MG PO TABS: 1.0000 | ORAL_TABLET | ORAL | Status: DC | PRN

## 2016-01-01 NOTE — Progress Notes (Signed)
   Subjective:    Patient ID: Cheryl Rowe, female    DOB: 08-Apr-1961, 55 y.o.   MRN: [id number]  HPI Patient is here today for a follow up visit on back pain. Patient states that her back pain is about the same, not much improvement. Anxiety/stress has improved with the klonopin.  Patient states that she is taking aspirin at least 4 times a week for headaches. She states that she is concerned about the interaction of the aspirin with her other medications.    Had to stop phys tx bcz rib pain and spasms Will see dr Ellene Route for recheck He is treating the scoliosis and lumbar pain Pt very worried about her back Using no greater than 5 days bm moving No drowsiness Review of Systems Patient states depression under good control currently   see above. Denies loss of bowel or bladder control relates back pain leg pain relates feeling a lot of stress because of this issue. Objective:   Physical Exam Patient with positive straight leg raise also low back pain discomfort. Radiates into the right side. Lungs clear heart regular.       Assessment & Plan:  Significant back pain with scoliosis to pain prescriptions were given may get the next 1 filled 2 days early because they'll be out of town but otherwise to stick with 5 per day. Follow-up as planned  She is currently deciding whether or not to have surgery she will meet with neurosurgeon tomorrow and come up with the decision.  Patient was encouraged strongly to cut back on aspirin goody powder use to use no more than 2 or 3 per week and even then there is a risk of stomach upset and ulcers. Patient understands this

## 2016-01-07 ENCOUNTER — Other Ambulatory Visit (HOSPITAL_COMMUNITY): Payer: Self-pay | Admitting: Neurological Surgery

## 2016-01-07 ENCOUNTER — Other Ambulatory Visit: Payer: Self-pay | Admitting: Neurological Surgery

## 2016-01-07 DIAGNOSIS — M4120 Other idiopathic scoliosis, site unspecified: Secondary | ICD-10-CM

## 2016-01-16 ENCOUNTER — Ambulatory Visit (INDEPENDENT_AMBULATORY_CARE_PROVIDER_SITE_OTHER): Payer: BLUE CROSS/BLUE SHIELD | Admitting: Family Medicine

## 2016-01-16 ENCOUNTER — Encounter: Payer: Self-pay | Admitting: Family Medicine

## 2016-01-16 VITALS — BP 138/86 | Ht 66.0 in | Wt 199.0 lb

## 2016-01-16 DIAGNOSIS — M4120 Other idiopathic scoliosis, site unspecified: Secondary | ICD-10-CM | POA: Diagnosis not present

## 2016-01-16 NOTE — Progress Notes (Signed)
   Subjective:    Patient ID: Cheryl Rowe, female    DOB: 1961/01/04, 55 y.o.   MRN: [id number]  HPIpt scheduled to have myelogram 5/25. Pt states she was told she would have to stop taking effexor for the procedure and she does not think she can stop taking med.   Pt states no other concerns today.     Review of Systems     Objective:   Physical Exam  Lungs clear heart regular pulse normal      Assessment & Plan:  15-20 minutes was spent with the patient counseling her regarding her back She has scoliosis they're looking at the possibility of doing surgery on her she is scared about surgery but she also states the pain at times is unbearable we talked about various options she will be seeing a surgical specialist at the [facility] they will do further evaluation on whether or not surgery would be beneficial. I have encouraged the patient and her husband to assess the specialists many questions including the benefits and risk of surgery versus the benefits and risk of not doing surgery.  The patient states that she will be canceling her myelogram because she cannot be off of her Effexor for several days in a row without having significant withdrawal

## 2016-01-23 ENCOUNTER — Ambulatory Visit (HOSPITAL_COMMUNITY): Payer: PRIVATE HEALTH INSURANCE

## 2016-02-28 ENCOUNTER — Telehealth: Payer: Self-pay | Admitting: Family Medicine

## 2016-02-28 NOTE — Telephone Encounter (Signed)
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 I certainly need more details than this. Also if there was someone she was seen or recommended in what tests they recommended

## 2016-02-28 NOTE — Telephone Encounter (Signed)
Pt is requesting a full back mri to be ordered. Pt states that [facility] is requesting this to be done before they will see her.

## 2016-02-28 NOTE — Telephone Encounter (Signed)
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 Patient verbalized understanding and stated that she will call [facility] to ask if that doctor can order MRI or what else would be recommended.

## 2016-03-04 ENCOUNTER — Ambulatory Visit (INDEPENDENT_AMBULATORY_CARE_PROVIDER_SITE_OTHER): Payer: BLUE CROSS/BLUE SHIELD | Admitting: Family Medicine

## 2016-03-04 ENCOUNTER — Encounter: Payer: Self-pay | Admitting: Family Medicine

## 2016-03-04 ENCOUNTER — Ambulatory Visit (HOSPITAL_COMMUNITY)
Admission: RE | Admit: 2016-03-04 | Discharge: 2016-03-04 | Disposition: A | Discharge status: Home / Self Care | Payer: BLUE CROSS/BLUE SHIELD | Source: Ambulatory Visit | Attending: Family Medicine | Admitting: Family Medicine

## 2016-03-04 VITALS — BP 112/76 | Ht 66.0 in | Wt 197.1 lb

## 2016-03-04 DIAGNOSIS — M25551 Pain in right hip: Secondary | ICD-10-CM

## 2016-03-04 MED ORDER — OXYCODONE-ACETAMINOPHEN 10-325 MG PO TABS: 1.0000 | ORAL_TABLET | ORAL | Status: DC | PRN

## 2016-03-04 MED ORDER — PREDNISONE 20 MG PO TABS: ORAL_TABLET | ORAL | Status: DC

## 2016-03-04 NOTE — Progress Notes (Signed)
   Subjective:    Patient ID: Cheryl Rowe, female    DOB: [birth year], 55 y.o.   MRN: [id number]  Hip Pain  The incident occurred more than 1 week ago. There was no injury mechanism. The pain is present in the right hip and right leg. The quality of the pain is described as aching. The pain is at a severity of 8/10. Associated symptoms include muscle weakness. She reports no foreign bodies present. The symptoms are aggravated by movement (Twisting movement).   Patient states no other concerns this visit.   Review of Systems She relates pain discomfort down both legs worse on the right side than the left side relates mid back and upper back pain    Objective:   Physical Exam  Patient with mild discomfort in the lower back radiates into right hip positive right leg raise on the right increased pain in the lower back as well as right hip Patient also has severe scoliosis with subjective pain in the mid and upper thoracic spine      Assessment & Plan:  Severe scoliosis follow-up with specialist as planned hopefully they will ordered the total spine MRI then they will have her follow-up she is planning to do this through the [facility]  Right hip pain prednisone taper continue pain medicine to prescriptions on pain medicine given patient will follow-up in 2-3 months in addition to that prednisone taper should help x-ray ordered await results  Pain medication as prescribed 2 additional prescriptions given follow-up within 3 months

## 2016-03-06 ENCOUNTER — Telehealth: Payer: Self-pay | Admitting: Family Medicine

## 2016-03-06 NOTE — Telephone Encounter (Signed)
Pt is calling to see if you want to see her next week for this  Procedure, the last night she can take the effexor is the 17th until Sunday  The 23rd she take it again. She states you spoke with her about this and  Giving her something to help her make it through to the Sunday   Please advise   [facility] in [place]   Venlafaxine HCl 75 MG TB24 is what she is coming off of for the procedure

## 2016-03-10 ENCOUNTER — Other Ambulatory Visit: Payer: Self-pay | Admitting: *Deleted

## 2016-03-10 MED ORDER — CLONAZEPAM 0.5 MG PO TABS: 0.5000 mg | ORAL_TABLET | Freq: Three times a day (TID) | ORAL | Status: DC | PRN

## 2016-03-10 NOTE — Telephone Encounter (Signed)
Script faxed and pt notified

## 2016-03-10 NOTE — Telephone Encounter (Signed)
The difficult part is there is no particular medication that would necessarily take the place of Effexor that would not interfere with her epidural she can use the nerve medication 3 times daily rather than twice daily during those days as long as it does not cause significant drowsiness.

## 2016-03-10 NOTE — Telephone Encounter (Signed)
Discussed with pt. Pt will run out of klonopin early. She wants a script with directions one tid for the 4 days that she will need it.

## 2016-03-10 NOTE — Telephone Encounter (Signed)
She may have a prescription for 5 days 1 taken 3 times a day as needed

## 2016-03-10 NOTE — Telephone Encounter (Signed)
[facility]

## 2016-03-19 ENCOUNTER — Ambulatory Visit (HOSPITAL_COMMUNITY)
Admission: RE | Admit: 2016-03-19 | Discharge: 2016-03-19 | Disposition: A | Discharge status: Home / Self Care | Payer: BLUE CROSS/BLUE SHIELD | Source: Ambulatory Visit | Attending: Neurological Surgery | Admitting: Neurological Surgery

## 2016-03-19 DIAGNOSIS — M4322 Fusion of spine, cervical region: Secondary | ICD-10-CM | POA: Insufficient documentation

## 2016-03-19 DIAGNOSIS — M4726 Other spondylosis with radiculopathy, lumbar region: Secondary | ICD-10-CM | POA: Diagnosis not present

## 2016-03-19 DIAGNOSIS — R918 Other nonspecific abnormal finding of lung field: Secondary | ICD-10-CM | POA: Insufficient documentation

## 2016-03-19 DIAGNOSIS — M4184 Other forms of scoliosis, thoracic region: Secondary | ICD-10-CM | POA: Diagnosis not present

## 2016-03-19 DIAGNOSIS — M5126 Other intervertebral disc displacement, lumbar region: Secondary | ICD-10-CM | POA: Diagnosis not present

## 2016-03-19 DIAGNOSIS — M2578 Osteophyte, vertebrae: Secondary | ICD-10-CM | POA: Diagnosis not present

## 2016-03-19 DIAGNOSIS — N289 Disorder of kidney and ureter, unspecified: Secondary | ICD-10-CM | POA: Insufficient documentation

## 2016-03-19 DIAGNOSIS — M4186 Other forms of scoliosis, lumbar region: Secondary | ICD-10-CM | POA: Diagnosis not present

## 2016-03-19 DIAGNOSIS — M4120 Other idiopathic scoliosis, site unspecified: Secondary | ICD-10-CM | POA: Diagnosis present

## 2016-03-19 DIAGNOSIS — M5106 Intervertebral disc disorders with myelopathy, lumbar region: Secondary | ICD-10-CM | POA: Diagnosis not present

## 2016-03-19 DIAGNOSIS — K449 Diaphragmatic hernia without obstruction or gangrene: Secondary | ICD-10-CM | POA: Insufficient documentation

## 2016-03-19 DIAGNOSIS — E0789 Other specified disorders of thyroid: Secondary | ICD-10-CM | POA: Insufficient documentation

## 2016-03-19 DIAGNOSIS — M4712 Other spondylosis with myelopathy, cervical region: Secondary | ICD-10-CM | POA: Insufficient documentation

## 2016-03-19 DIAGNOSIS — M5124 Other intervertebral disc displacement, thoracic region: Secondary | ICD-10-CM | POA: Diagnosis not present

## 2016-03-19 MED ORDER — KETOROLAC TROMETHAMINE 30 MG/ML IJ SOLN
INTRAMUSCULAR | Status: AC
Start: 2016-03-19 — End: 2016-03-19
  Administered 2016-03-19: 60 mg
  Filled 2016-03-19: qty 2

## 2016-03-19 MED ORDER — LIDOCAINE HCL 1 % IJ SOLN
INTRAMUSCULAR | Status: AC
Start: 2016-03-19 — End: 2016-03-19
  Filled 2016-03-19: qty 10

## 2016-03-19 MED ORDER — ONDANSETRON HCL 4 MG/2ML IJ SOLN: 4.0000 mg | Freq: Four times a day (QID) | INTRAMUSCULAR | Status: DC | PRN

## 2016-03-19 MED ORDER — IOHEXOL 300 MG/ML  SOLN
10.0000 mL | Freq: Once | INTRAMUSCULAR | Status: AC | PRN
  Administered 2016-03-19: 10 mL via INTRATHECAL

## 2016-03-19 MED ORDER — DIAZEPAM 5 MG PO TABS
ORAL_TABLET | ORAL | Status: AC
  Administered 2016-03-19: 10 mg
  Filled 2016-03-19: qty 2

## 2016-03-19 MED ORDER — KETOROLAC TROMETHAMINE 60 MG/2ML IM SOLN: 60.0000 mg | Freq: Once | INTRAMUSCULAR | Status: DC

## 2016-03-19 MED ORDER — KETOROLAC TROMETHAMINE 30 MG/ML IJ SOLN: 60.0000 mg | Freq: Once | INTRAMUSCULAR | Status: DC

## 2016-03-19 MED ORDER — OXYCODONE-ACETAMINOPHEN 5-325 MG PO TABS
1.0000 | ORAL_TABLET | ORAL | Status: DC | PRN
  Administered 2016-03-19 (×2): 1 via ORAL

## 2016-03-19 MED ORDER — DIAZEPAM 5 MG PO TABS: 10.0000 mg | ORAL_TABLET | Freq: Once | ORAL | Status: DC

## 2016-03-19 MED ORDER — OXYCODONE-ACETAMINOPHEN 5-325 MG PO TABS
ORAL_TABLET | ORAL | Status: AC
  Filled 2016-03-19: qty 1

## 2016-03-19 MED ORDER — LIDOCAINE HCL (PF) 1 % IJ SOLN
10.0000 mL | Freq: Once | INTRAMUSCULAR | Status: AC
  Administered 2016-03-19: 5 mL via INTRADERMAL

## 2016-03-19 NOTE — Procedures (Signed)
Patient is a 55 year old individual who has had significant problems with back shoulder and neck pain. 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 A total myelogram is now being performed to better evaluate this process as she has had unrelenting pain in her back neck shoulders and arms. He also has a degenerative scoliosis that has been aggravating her substantially. It is likely that she'll need extensive operation the myelogram is now being performed to better identify the areas of involvement.  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

## 2016-03-19 NOTE — Progress Notes (Signed)
Dr Ellene Route was contacted, pt requests pain med for home use. Dr Ellene Route will have a script at the office for her to pick up, pt aware.

## 2016-03-19 NOTE — Discharge Instructions (Signed)
Myelography, Care After °These instructions give you information on caring for yourself after your procedure. Your doctor may also give you more specific instructions. Call your doctor if you have any problems or questions after your procedure. °HOME CARE °· Rest the first day. °· When you rest, lie flat, with your head slightly raised (elevated). °· Avoid heavy lifting and activity for 48 hours, or as told by your doctor. °· You may take the bandage (dressing) off one day after the test, or as told by your doctor. °· Take all medicines only as told by your doctor. °· Ask your doctor when it is okay to take a shower or bath. °· Ask your doctor when your test results will be ready and how you can get them. Make sure you follow up and get your results. °· Do not drink alcohol for 24 hours, or as told by your doctor. °· Drink enough fluid to keep your pee (urine) clear or pale yellow. °GET HELP IF:  °· You have a fever. °· You have a headache. °· You feel sick to your stomach (nauseous) or throw up (vomit). °· You have pain or cramping in your belly (abdomen). °GET HELP RIGHT AWAY IF:  °· You have a headache with a stiff neck or fever. °· You have trouble breathing. °· Any of the places where the needles were put in are: °¨ Puffy (swollen) or red. °¨ Sore or hot to the touch. °¨ Draining yellowish-white fluid (pus). °¨ Bleeding. °MAKE SURE YOU: °· Understand these instructions. °· Will watch your condition. °· Will get help right away if you are not doing well or get worse. °  °This information is not intended to replace advice given to you by your health care provider. Make sure you discuss any questions you have with your health care provider. °  °Document Released: 05/26/2008 Document Revised: 09/07/2014 Document Reviewed: 05/11/2012 °Elsevier Interactive Patient Education ©2016 [employer] ° °

## 2016-04-06 ENCOUNTER — Telehealth: Payer: Self-pay | Admitting: Family Medicine

## 2016-04-06 NOTE — Telephone Encounter (Signed)
Pt is calling to check on her Oxycodone script, states insurance has changed and it needs to have a authorization before they can fill it.   [facility]

## 2016-04-06 NOTE — Telephone Encounter (Signed)
Pt got a 7 day supply on Friday. Notified pt that prior auth would be worked on [employer].

## 2016-04-07 NOTE — Telephone Encounter (Signed)
Pending prior auth

## 2016-04-08 ENCOUNTER — Other Ambulatory Visit: Payer: Self-pay | Admitting: *Deleted

## 2016-04-08 ENCOUNTER — Telehealth: Payer: Self-pay | Admitting: *Deleted

## 2016-04-08 MED ORDER — CLONAZEPAM 0.5 MG PO TABS: 0.5000 mg | ORAL_TABLET | Freq: Two times a day (BID) | ORAL | 0 refills | Status: DC | PRN

## 2016-04-08 MED ORDER — OXYCODONE-ACETAMINOPHEN 10-325 MG PO TABS: 1.0000 | ORAL_TABLET | ORAL | 0 refills | Status: DC | PRN

## 2016-04-08 NOTE — Telephone Encounter (Signed)
Consult with dr Nicki Reaper. Give one script of klonopin 0.5mg  one bid. Med sent to pharm. Pt needs to follow up in 2 -3 weeks and go back to taking one bid. Pt verbalized understanding.

## 2016-04-08 NOTE — Telephone Encounter (Signed)
Pt tried to fill script for oxy 10/325 #150 on august 4th. She only got #35 a 7 day supply due to insurance needing prior auth for greater than 7 days. Pa done today and was approved. Called pharm. They need a new script for the remainder of pills. Pharm voided script after giving 7 day supply. [facility] in [place]

## 2016-04-08 NOTE — Telephone Encounter (Signed)
Pt notified med approved. Pt now crying has been out of klonopin for 2 days. Was taking one tid instead of one bid.

## 2016-04-08 NOTE — Telephone Encounter (Signed)
Notified patient that script is ready for pickup.

## 2016-04-08 NOTE — Telephone Encounter (Signed)
Medication was approved and is valid from 04/07/16- 04/07/17. Reference number [id number]

## 2016-04-08 NOTE — Telephone Encounter (Signed)
May give rx for 30 day

## 2016-04-10 ENCOUNTER — Other Ambulatory Visit (HOSPITAL_COMMUNITY): Payer: Self-pay | Admitting: Neurological Surgery

## 2016-04-10 DIAGNOSIS — J9811 Atelectasis: Secondary | ICD-10-CM

## 2016-04-16 ENCOUNTER — Ambulatory Visit (HOSPITAL_COMMUNITY): Payer: PRIVATE HEALTH INSURANCE

## 2016-04-16 ENCOUNTER — Ambulatory Visit (HOSPITAL_COMMUNITY)
Admission: RE | Admit: 2016-04-16 | Discharge: 2016-04-16 | Disposition: A | Discharge status: Home / Self Care | Payer: BLUE CROSS/BLUE SHIELD | Source: Ambulatory Visit | Attending: Neurological Surgery | Admitting: Neurological Surgery

## 2016-04-16 DIAGNOSIS — M419 Scoliosis, unspecified: Secondary | ICD-10-CM | POA: Diagnosis not present

## 2016-04-16 DIAGNOSIS — E041 Nontoxic single thyroid nodule: Secondary | ICD-10-CM | POA: Insufficient documentation

## 2016-04-16 DIAGNOSIS — J9811 Atelectasis: Secondary | ICD-10-CM | POA: Diagnosis present

## 2016-04-16 DIAGNOSIS — I700 Atherosclerosis of aorta: Secondary | ICD-10-CM | POA: Insufficient documentation

## 2016-04-16 DIAGNOSIS — Z9049 Acquired absence of other specified parts of digestive tract: Secondary | ICD-10-CM | POA: Insufficient documentation

## 2016-04-16 MED ORDER — IOPAMIDOL (ISOVUE-300) INJECTION 61%
75.0000 mL | Freq: Once | INTRAVENOUS | Status: AC | PRN
  Administered 2016-04-16: 75 mL via INTRAVENOUS

## 2016-04-17 ENCOUNTER — Encounter: Payer: Self-pay | Admitting: Family Medicine

## 2016-04-17 ENCOUNTER — Ambulatory Visit (INDEPENDENT_AMBULATORY_CARE_PROVIDER_SITE_OTHER): Payer: BLUE CROSS/BLUE SHIELD | Admitting: Family Medicine

## 2016-04-17 VITALS — BP 146/88 | Ht 66.0 in | Wt 195.0 lb

## 2016-04-17 DIAGNOSIS — E041 Nontoxic single thyroid nodule: Secondary | ICD-10-CM | POA: Diagnosis not present

## 2016-04-17 DIAGNOSIS — E785 Hyperlipidemia, unspecified: Secondary | ICD-10-CM

## 2016-04-17 DIAGNOSIS — Z79899 Other long term (current) drug therapy: Secondary | ICD-10-CM

## 2016-04-17 MED ORDER — PANTOPRAZOLE SODIUM 40 MG PO TBEC: 40.0000 mg | DELAYED_RELEASE_TABLET | Freq: Every day | ORAL | 1 refills | Status: DC

## 2016-04-17 NOTE — Progress Notes (Signed)
   Subjective:    Patient ID: Cheryl Rowe, female    DOB: 18-Sep-1960, 55 y.o.   MRN: [id number]  HPIFollow up on thyroid goiter.   Found a spot on left lung and kidney lesion while having Myelogram and was told she needed to follow up on goiter.   Patient states she was told that they found area on the left kidney. That area in the lung that she was concerned about was actually atelectasis a CAT scan of her lungs did not show any nodules  She does have a history of hyperlipidemia but did not tolerate pravastatin she states she's been trying to eat better  Review of Systems Denies chest tightness pressure pain shortness of breath    Objective:   Physical Exam  Lungs clear hearts regular enlargement of the right thyroid noted      Assessment & Plan:  Thyroid nodule on CT scan await the ultrasound may well need referral for ENT evaluation and fine-needle aspiration and core biopsy lab work pending  Atherosclerosis on CAT scan lipid profile ordered  I reviewed over all the x-rays I did not see any kidney lesion. I don't recommend any further testing in that regard

## 2016-04-22 ENCOUNTER — Ambulatory Visit (HOSPITAL_COMMUNITY)
Admission: RE | Admit: 2016-04-22 | Discharge: 2016-04-22 | Disposition: A | Discharge status: Home / Self Care | Payer: BLUE CROSS/BLUE SHIELD | Source: Ambulatory Visit | Attending: Family Medicine | Admitting: Family Medicine

## 2016-04-22 NOTE — Addendum Note (Signed)
Addended by: Dairl Ponder on: 04/22/2016 04:43 PM   Modules accepted: Orders

## 2016-04-23 LAB — LIPID PANEL
Chol/HDL Ratio: 4.2 ratio units (ref 0.0–4.4)
Cholesterol, Total: 231 mg/dL — ABNORMAL HIGH (ref 100–199)
HDL: 55 mg/dL (ref >39)
LDL Calculated: 146 mg/dL — ABNORMAL HIGH (ref 0–99)
Triglycerides: 150 mg/dL — ABNORMAL HIGH (ref 0–149)
VLDL Cholesterol Cal: 30 mg/dL (ref 5–40)

## 2016-04-23 LAB — BASIC METABOLIC PANEL
BUN/Creatinine Ratio: 16 (ref 9–23)
BUN: 15 mg/dL (ref 6–24)
CO2: 26 mmol/L (ref 18–29)
Calcium: 10.2 mg/dL (ref 8.7–10.2)
Chloride: 104 mmol/L (ref 96–106)
Creatinine, Ser: 0.94 mg/dL (ref 0.57–1.00)
GFR calc Af Amer: 80 mL/min/{1.73_m2} (ref >59)
GFR calc non Af Amer: 69 mL/min/{1.73_m2} (ref >59)
Glucose: 80 mg/dL (ref 65–99)
Potassium: 4.8 mmol/L (ref 3.5–5.2)
Sodium: 143 mmol/L (ref 134–144)

## 2016-04-23 LAB — T4, FREE: Free T4: 0.79 ng/dL — ABNORMAL LOW (ref 0.82–1.77)

## 2016-04-23 LAB — TSH: TSH: 1.05 u[IU]/mL (ref 0.450–4.500)

## 2016-05-01 ENCOUNTER — Encounter: Payer: Self-pay | Admitting: "Endocrinology

## 2016-05-05 ENCOUNTER — Other Ambulatory Visit: Payer: Self-pay | Admitting: Family Medicine

## 2016-05-06 ENCOUNTER — Encounter: Payer: Self-pay | Admitting: Family Medicine

## 2016-05-06 ENCOUNTER — Ambulatory Visit (INDEPENDENT_AMBULATORY_CARE_PROVIDER_SITE_OTHER): Payer: BLUE CROSS/BLUE SHIELD | Admitting: Family Medicine

## 2016-05-06 VITALS — BP 136/86 | Ht 66.0 in | Wt 198.2 lb

## 2016-05-06 DIAGNOSIS — G8929 Other chronic pain: Secondary | ICD-10-CM

## 2016-05-06 DIAGNOSIS — M545 Low back pain, unspecified: Secondary | ICD-10-CM

## 2016-05-06 DIAGNOSIS — K219 Gastro-esophageal reflux disease without esophagitis: Secondary | ICD-10-CM

## 2016-05-06 MED ORDER — OXYCODONE-ACETAMINOPHEN 10-325 MG PO TABS: 1.0000 | ORAL_TABLET | ORAL | 0 refills | Status: DC | PRN

## 2016-05-06 MED ORDER — OMEPRAZOLE 40 MG PO CPDR: 40.0000 mg | DELAYED_RELEASE_CAPSULE | Freq: Every day | ORAL | 5 refills | Status: DC

## 2016-05-06 NOTE — Progress Notes (Signed)
   Subjective:    Patient ID: Cheryl Rowe, female    DOB: 1961-02-26, 55 y.o.   MRN: [id number]  HPI Patient went to ENT and they changed her protonix to dexilant but that med is giving her headaches and she wants to be changed to something else Patient needs prescription for pain med  Patient states her insurance will only let a script for pain meds be filled within 7 days of being written-no advanced dates anymore She denies any other major issues I talked to her at length about the importance of modifying diet and sleeping position to help her with reflux issues. She understands this. Review of Systems     Objective:   Physical Exam Lungs clear heart regular pulse normal abdomen soft       Assessment & Plan:  Reflux issues-laryngeal irritation from reflux ENT recommended that she stop protonix- the medicine they recommended is not covered. Try omeprazole 40 mg daily  Severe back pain-apparently her pharmacy will not let her fill a prescription that was written over a month ago so therefore we will go ahead and write a new prescription for the patient patient will follow-up in 4 weeks if needing additional pain medicine she is supposed to be going to [place] for an opinion on her back in the near future

## 2016-06-02 ENCOUNTER — Telehealth: Payer: Self-pay | Admitting: Family Medicine

## 2016-06-02 MED ORDER — OXYCODONE-ACETAMINOPHEN 10-325 MG PO TABS: 1.0000 | ORAL_TABLET | ORAL | 0 refills | Status: DC | PRN

## 2016-06-02 NOTE — Telephone Encounter (Signed)
Seen 7/5 for hip pain. 3 scripts given on that visit. No up coming appt scheduled.

## 2016-06-02 NOTE — Telephone Encounter (Signed)
This patient gets her prescriptions filled in [place]. Call the pharmacy that she gets it filled confirmed the day then she may have one additional prescription then she ought to follow-up in approximately 4 weeks

## 2016-06-02 NOTE — Telephone Encounter (Signed)
Left message return call 06/02/16 (prescription printed. Last script was filled 05/08/16, next script due 06/07/16)

## 2016-06-02 NOTE — Telephone Encounter (Signed)
Patient needs Rx for her percocet to pick up on Friday.

## 2016-06-03 NOTE — Telephone Encounter (Signed)
Discussed with pt. Pt wanted to let you know that she got an appt with doctor at [facility] on nov 27th

## 2016-06-14 ENCOUNTER — Other Ambulatory Visit: Payer: Self-pay | Admitting: Family Medicine

## 2016-06-15 NOTE — Telephone Encounter (Signed)
May have this +4 refills 

## 2016-07-06 ENCOUNTER — Encounter: Payer: Self-pay | Admitting: Family Medicine

## 2016-07-06 ENCOUNTER — Ambulatory Visit (INDEPENDENT_AMBULATORY_CARE_PROVIDER_SITE_OTHER): Payer: BLUE CROSS/BLUE SHIELD | Admitting: Family Medicine

## 2016-07-06 VITALS — BP 118/76 | Ht 66.0 in | Wt 197.6 lb

## 2016-07-06 DIAGNOSIS — M5442 Lumbago with sciatica, left side: Secondary | ICD-10-CM

## 2016-07-06 DIAGNOSIS — G8929 Other chronic pain: Secondary | ICD-10-CM | POA: Diagnosis not present

## 2016-07-06 DIAGNOSIS — Z79891 Long term (current) use of opiate analgesic: Secondary | ICD-10-CM | POA: Diagnosis not present

## 2016-07-06 MED ORDER — OXYCODONE-ACETAMINOPHEN 10-325 MG PO TABS: 1.0000 | ORAL_TABLET | ORAL | 0 refills | Status: DC | PRN

## 2016-07-06 NOTE — Progress Notes (Signed)
   Subjective:    Patient ID: Cheryl Rowe, female    DOB: 10-13-1960, 55 y.o.   MRN: [id number]  HPI This patient was seen today for chronic pain  The medication list was reviewed and updated.   -Compliance with medication: yes-oxycodone 10mg    - Number patient states they take daily:5 times a day  -when was the last dose patient took? today  The patient was advised the importance of maintaining medication and not using illegal substances with these.  Refills needed: yes The patient was educated that we can provide 3 monthly scripts for their medication, it is their responsibility to follow the instructions.  Side effects or complications from medications: none  Patient is aware that pain medications are meant to minimize the severity of the pain to allow their pain levels to improve to allow for better function. They are aware of that pain medications cannot totally remove their pain.  Due for UDT ( at least once per year) : today     patient states it's necessary for her to take up to 5 these per day in order to help control her pain she denies any excessive drowsiness and dizziness nausea or vomiting with it.    Review of Systems  Constitutional: Negative for activity change and appetite change.  Gastrointestinal: Negative for abdominal pain and vomiting.  Neurological: Negative for weakness.  Psychiatric/Behavioral: Negative for confusion.       Objective:   Physical Exam  Constitutional: She appears well-nourished. No distress.  HENT:  Head: Normocephalic.  Cardiovascular: Normal rate, regular rhythm and normal heart sounds.   No murmur heard. Pulmonary/Chest: Effort normal and breath sounds normal.  Musculoskeletal: She exhibits no edema.  Lymphadenopathy:    She has no cervical adenopathy.  Neurological: She is alert.  Psychiatric: Her behavior is normal.  Vitals reviewed.   Patient states she takes no more than 5 per day she denies any severe side  effects She does state it helps with the level of her pain allows her to be more functional. The pain interferes greatly with general activity if she does not keep it under control. He is Dr. Nicki Rowe looking in [facility] I am well they hope you are       Assessment & Plan:   severe chronic back pain The patient was seen today as part of a comprehensive visit regarding pain control. Patient's compliance with the medication as well as discussion regarding effectiveness was completed. Prescriptions were written. Patient was advised to follow-up in 3 months. The patient was assessed for any signs of severe side effects. The patient was advised to take the medicine as directed and to report to [place] if any side effect issues. The patient will be seen the specialist at [facility] on 07/29/2016 hopefully they will help set her up for probable surgery.

## 2016-07-10 LAB — TOXASSURE SELECT 13 (MW), URINE

## 2016-08-05 ENCOUNTER — Other Ambulatory Visit: Payer: Self-pay | Admitting: Family Medicine

## 2016-08-17 ENCOUNTER — Ambulatory Visit: Payer: BLUE CROSS/BLUE SHIELD | Admitting: Family Medicine

## 2016-08-26 IMAGING — DX DG HIP (WITH OR WITHOUT PELVIS) 4+V*R*
3 series · 3 of 3 positions shown · non-contrast
Comparison: Coronal and sagittal reconstructed images through the
pelvis and right hip from a CT scan April 11, 2015

CLINICAL DATA: Chronic right hip pain

EXAM:
DG HIP (WITH OR WITHOUT PELVIS) 4+V RIGHT

[pelvis ap]
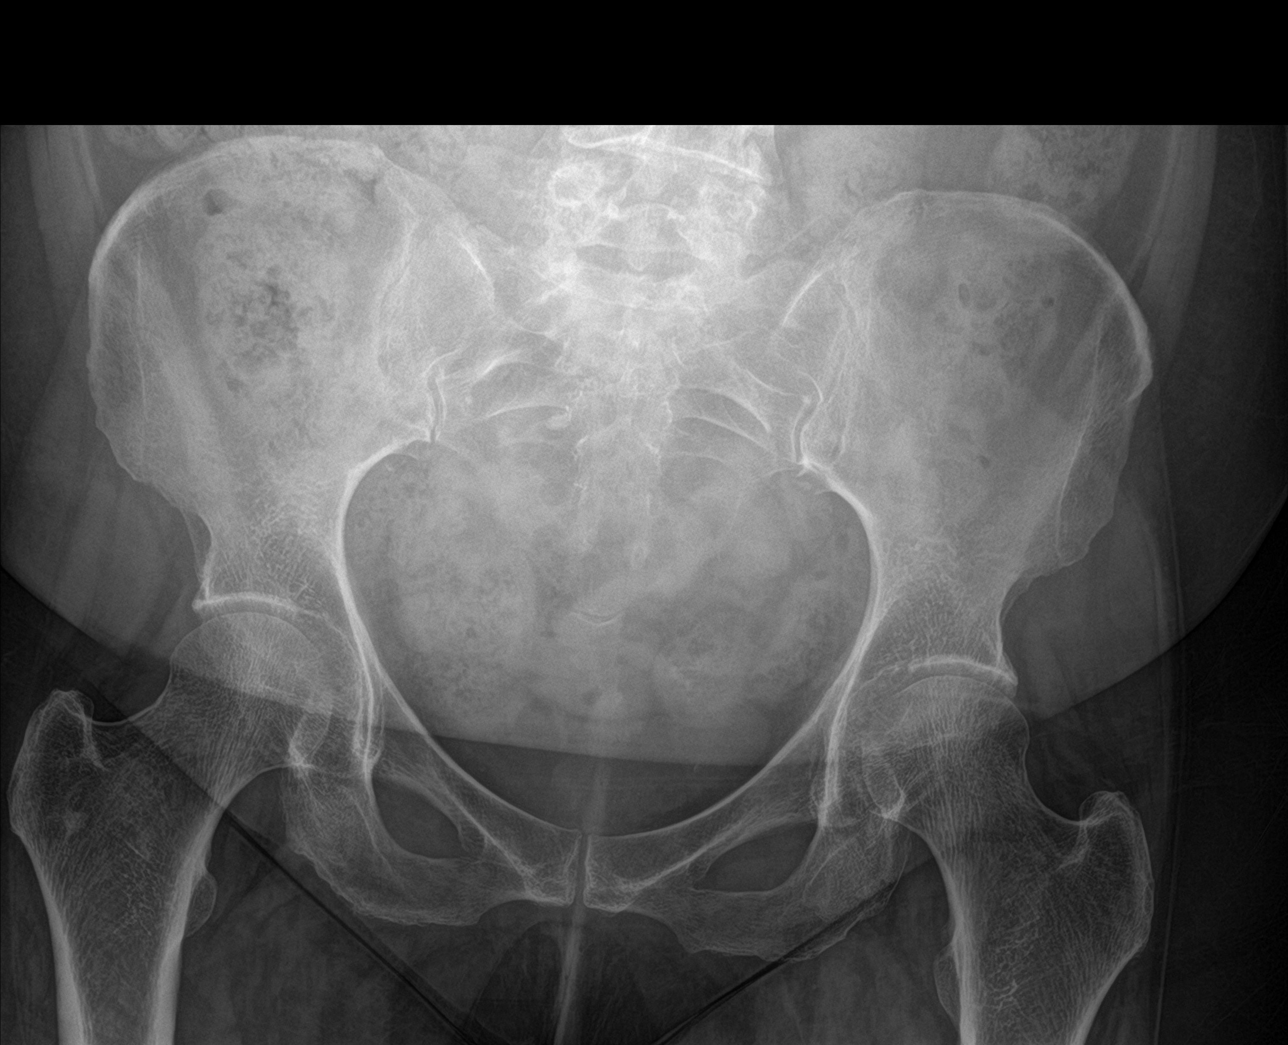

[hip ap]
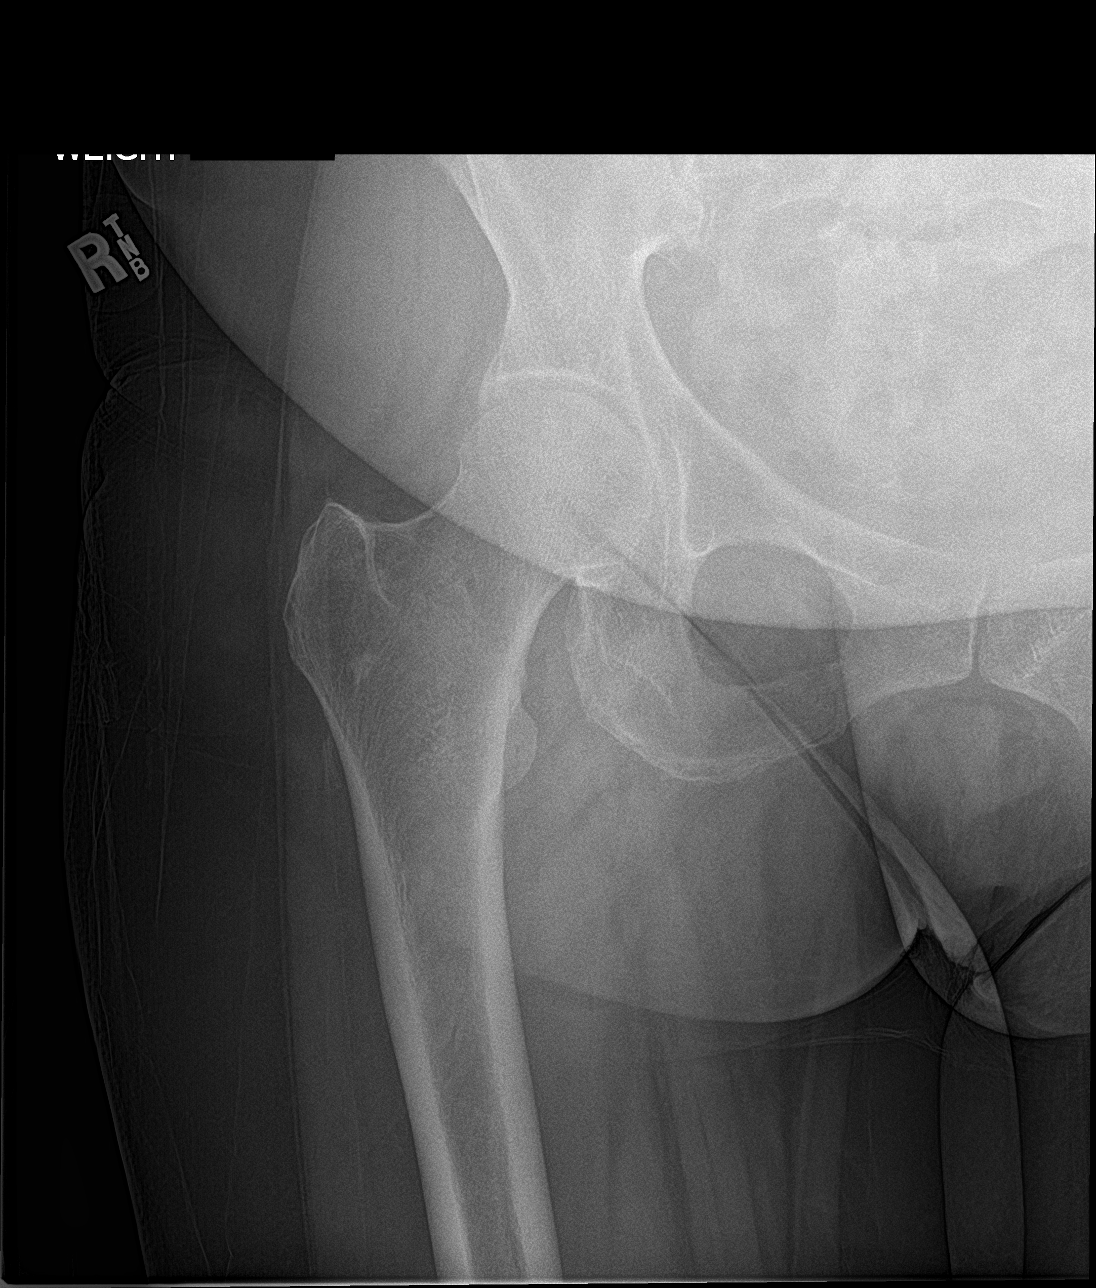

[hip lat]
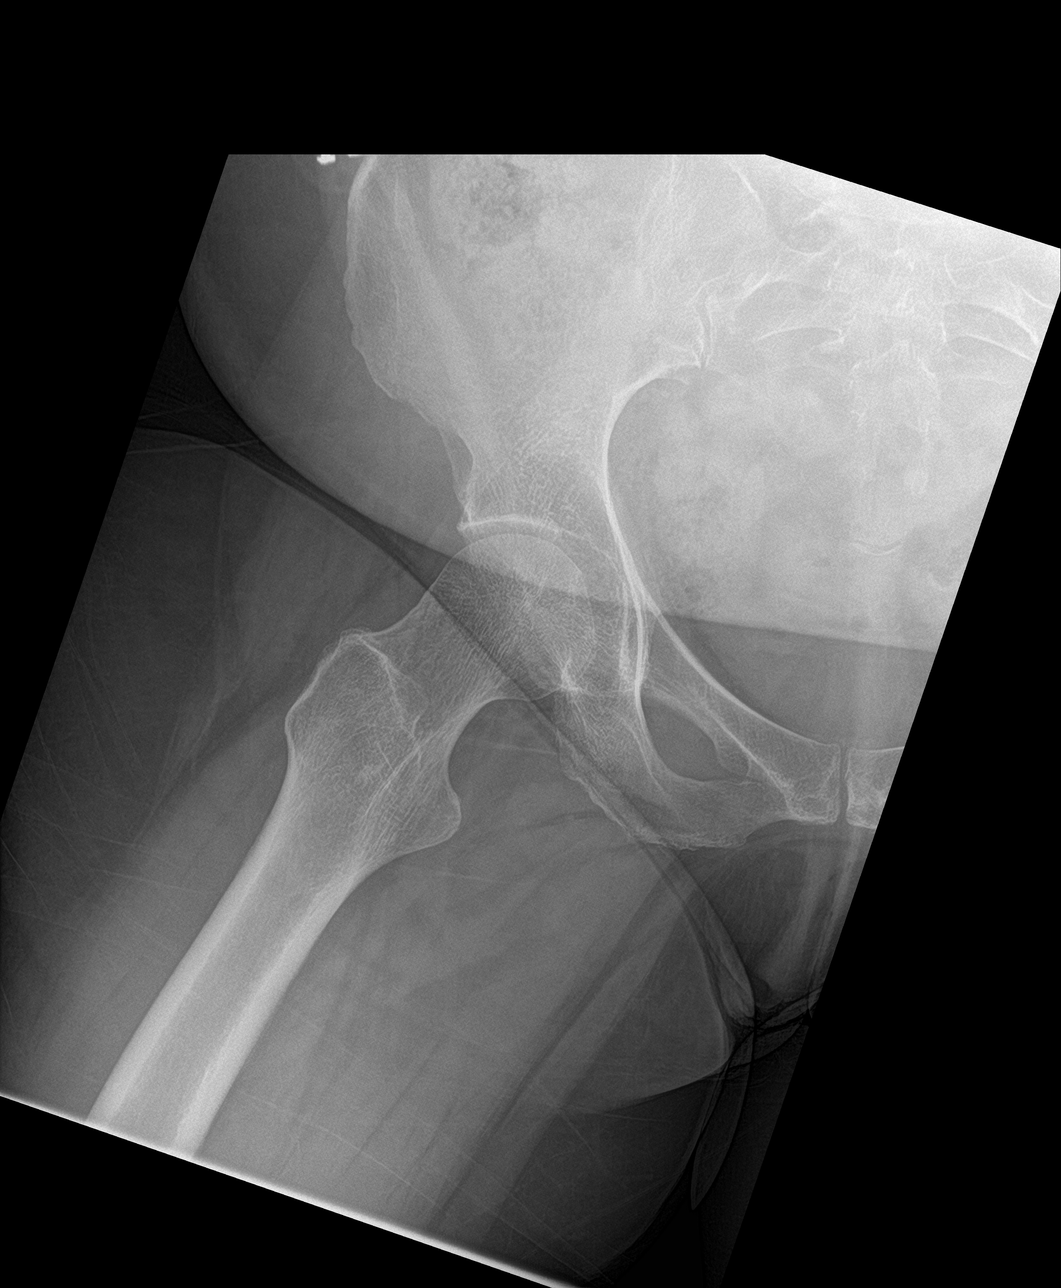

[3 of 3 positions shown; findings below may reference images not displayed]

FINDINGS: The bones are subjectively osteopenic. The pelvis exhibits no lytic
or blastic lesion nor evidence of a fracture. The hip joint spaces
are reasonably symmetric from right to left. AP and lateral views of
the right hip reveal the articular surfaces of the femoral head and
acetabulum to remains smoothly rounded. The femoral neck,
intertrochanteric, and subtrochanteric regions are normal.
IMPRESSION: There is no acute or significant chronic bony abnormality of the
right hip.

## 2016-09-01 ENCOUNTER — Ambulatory Visit (INDEPENDENT_AMBULATORY_CARE_PROVIDER_SITE_OTHER): Payer: BLUE CROSS/BLUE SHIELD | Admitting: Family Medicine

## 2016-09-01 ENCOUNTER — Encounter: Payer: Self-pay | Admitting: Family Medicine

## 2016-09-01 VITALS — BP 136/88 | Ht 66.0 in | Wt 201.0 lb

## 2016-09-01 DIAGNOSIS — M5442 Lumbago with sciatica, left side: Secondary | ICD-10-CM | POA: Diagnosis not present

## 2016-09-01 DIAGNOSIS — G8929 Other chronic pain: Secondary | ICD-10-CM | POA: Diagnosis not present

## 2016-09-01 MED ORDER — OXYCODONE-ACETAMINOPHEN 10-325 MG PO TABS: ORAL_TABLET | ORAL | 0 refills | Status: DC

## 2016-09-01 MED ORDER — TRAZODONE HCL 100 MG PO TABS: 100.0000 mg | ORAL_TABLET | Freq: Every day | ORAL | 3 refills | Status: DC

## 2016-09-01 NOTE — Progress Notes (Signed)
   Subjective:    Patient ID: Cheryl Rowe, female    DOB: 1961/07/24, 56 y.o.   MRN: [id number]  HPI This patient was seen today for chronic pain.  Takes for low back and mid back pain. Saw specialist at [facility] and pt thinks she will have surgery.   The medication list was reviewed and updated.   -Compliance with medication: no somedays has taken 6 instead of 5  - Number patient states they take daily: 5 -6 a day  -when was the last dose patient took? This am  The patient was advised the importance of maintaining medication and not using illegal substances with these.  Refills needed: yes  The patient was educated that we can provide 3 monthly scripts for their medication, it is their responsibility to follow the instructions.  Side effects or complications from medications: none  Patient is aware that pain medications are meant to minimize the severity of the pain to allow their pain levels to improve to allow for better function. They are aware of that pain medications cannot totally remove their pain.  Due for UDT ( at least once per year) : done 07/06/16  Needs update on trazodone.    Patient saw the specialists at [facility] they recommended injections possibility of surgery she will be seen Dr. Ellene Route in the near future currently taking 1 pill every 4 hours approximately 6 a day  Patient does take her Klonopin for anxiety and denies a causing any side effects Review of Systems  Constitutional: Negative for activity change and appetite change.  Gastrointestinal: Negative for abdominal pain and vomiting.  Neurological: Negative for weakness.  Psychiatric/Behavioral: Negative for confusion.       Objective:   Physical Exam  Constitutional: She appears well-nourished. No distress.  HENT:  Head: Normocephalic.  Cardiovascular: Normal rate, regular rhythm and normal heart sounds.   No murmur heard. Pulmonary/Chest: Effort normal and breath sounds normal.    Musculoskeletal: She exhibits no edema.  Lymphadenopathy:    She has no cervical adenopathy.  Neurological: She is alert.  Psychiatric: Her behavior is normal.  Vitals reviewed.         Assessment & Plan:  The patient was seen today as part of a comprehensive visit regarding pain control. Patient's compliance with the medication as well as discussion regarding effectiveness was completed. Prescriptions were written. Patient was advised to follow-up in 2 months. The patient was assessed for any signs of severe side effects. The patient was advised to take the medicine as directed and to report to [place] if any side effect issues. Patient was given 2 prescription she was instructed to follow-up in 2 months That way the patient may use up to 6 per day but hopefully by 2 months from now it will be defined how she is doing overall in we can decide on long-term treatment measures She is contemplating possibility of surgery with Dr. Ellene Route Patient denies abusing the medication  Blood pressure borderline encourage healthy diet regular physical activity

## 2016-09-24 ENCOUNTER — Telehealth: Payer: Self-pay | Admitting: Family Medicine

## 2016-09-24 MED ORDER — OMEPRAZOLE 40 MG PO CPDR: 40.0000 mg | DELAYED_RELEASE_CAPSULE | Freq: Every day | ORAL | 0 refills | Status: DC

## 2016-09-24 MED ORDER — TRAZODONE HCL 100 MG PO TABS
100.0000 mg | ORAL_TABLET | Freq: Every day | ORAL | 0 refills | Status: DC
Start: 2016-09-24 — End: 2016-10-27

## 2016-09-24 MED ORDER — CLONAZEPAM 0.5 MG PO TABS: 0.5000 mg | ORAL_TABLET | Freq: Two times a day (BID) | ORAL | 2 refills | Status: DC | PRN

## 2016-09-24 MED ORDER — VENLAFAXINE HCL ER 75 MG PO TB24: ORAL_TABLET | ORAL | 0 refills | Status: DC

## 2016-09-24 NOTE — Telephone Encounter (Signed)
Last prescribed 06/15/16 with 4 refills, fills monthly, patient had it sent to [facility] and [facility]. Now she is needing it sent back to [facility] but they need new scripts before they can fill meds.

## 2016-09-24 NOTE — Telephone Encounter (Signed)
Ok plus two ref 

## 2016-09-24 NOTE — Telephone Encounter (Signed)
When was it last prescribed? Refills? Refill left? Does she fill monthly? Is pt requesting to swith this to new pharmacy?

## 2016-09-24 NOTE — Telephone Encounter (Signed)
Meds sent to pharmacy. Patient was notified.

## 2016-09-24 NOTE — Telephone Encounter (Signed)
Refill Clonazepam 

## 2016-09-24 NOTE — Telephone Encounter (Signed)
Patient had her medications sent to [facility].  At the time, they did not take her Rx card so she moved to [facility] is now taking her Rx card now and she is requesting [place] to send a whole new profile update to them as requested by [facility].  Also, she needs new Rx for Clonazepam sent.

## 2016-09-30 ENCOUNTER — Ambulatory Visit: Payer: BLUE CROSS/BLUE SHIELD | Admitting: Family Medicine

## 2016-10-15 ENCOUNTER — Other Ambulatory Visit: Payer: Self-pay | Admitting: Neurological Surgery

## 2016-10-27 ENCOUNTER — Encounter: Payer: Self-pay | Admitting: Family Medicine

## 2016-10-27 ENCOUNTER — Ambulatory Visit (INDEPENDENT_AMBULATORY_CARE_PROVIDER_SITE_OTHER): Payer: BLUE CROSS/BLUE SHIELD | Admitting: Family Medicine

## 2016-10-27 VITALS — BP 134/92 | Ht 66.0 in | Wt 179.0 lb

## 2016-10-27 DIAGNOSIS — M5442 Lumbago with sciatica, left side: Secondary | ICD-10-CM

## 2016-10-27 DIAGNOSIS — G8929 Other chronic pain: Secondary | ICD-10-CM

## 2016-10-27 DIAGNOSIS — I10 Essential (primary) hypertension: Secondary | ICD-10-CM

## 2016-10-27 MED ORDER — TRAZODONE HCL 100 MG PO TABS: 100.0000 mg | ORAL_TABLET | Freq: Every day | ORAL | 3 refills | Status: DC

## 2016-10-27 MED ORDER — VENLAFAXINE HCL ER 75 MG PO TB24: ORAL_TABLET | ORAL | 2 refills | Status: DC

## 2016-10-27 MED ORDER — OXYCODONE-ACETAMINOPHEN 10-325 MG PO TABS: ORAL_TABLET | ORAL | 0 refills | Status: DC

## 2016-10-27 MED ORDER — LISINOPRIL 5 MG PO TABS
5.0000 mg | ORAL_TABLET | Freq: Every day | ORAL | 1 refills | Status: DC
Start: 2016-10-27 — End: 2016-12-24

## 2016-10-27 MED ORDER — CLONAZEPAM 0.5 MG PO TABS: 0.5000 mg | ORAL_TABLET | Freq: Two times a day (BID) | ORAL | 4 refills | Status: DC | PRN

## 2016-10-27 NOTE — Patient Instructions (Addendum)
Beda-your blood pressure is mildly elevated. Cut back on salt. Start lisinopril 5 mg, take one half tablet daily for the next week then send me blood pressure readings we might need to boost it up to a full tablet  I would recommend gradually cutting back on clonazepam. Medical studies show that people who are on significant amount of pain medicine and nerve pills have a 40% increased risk of accidental overdose. I recommend reducing clonazepam to a half tablet 3 times daily if you do well with that the next step would be a half tablet twice a day you may reduce the clonazepam by a half a tablet every 2 week as tolerated.        DASH Eating Plan DASH stands for "Dietary Approaches to Stop Hypertension." The DASH eating plan is a healthy eating plan that has been shown to reduce high blood pressure (hypertension). It may also reduce your risk for type 2 diabetes, heart disease, and stroke. The DASH eating plan may also help with weight loss. What are tips for following this plan? General guidelines   Avoid eating more than 2,300 mg (milligrams) of salt (sodium) a day. If you have hypertension, you may need to reduce your sodium intake to 1,500 mg a day.  Limit alcohol intake to no more than 1 drink a day for nonpregnant women and 2 drinks a day for men. One drink equals 12 oz of beer, 5 oz of wine, or 1 oz of hard liquor.  Work with your health care provider to maintain a healthy body weight or to lose weight. Ask what an ideal weight is for you.  Get at least 30 minutes of exercise that causes your heart to beat faster (aerobic exercise) most days of the week. Activities may include walking, swimming, or biking.  Work with your health care provider or diet and nutrition specialist (dietitian) to adjust your eating plan to your individual calorie needs. Reading food labels   Check food labels for the amount of sodium per serving. Choose foods with less than 5 percent of the Daily Value of  sodium. Generally, foods with less than 300 mg of sodium per serving fit into this eating plan.  To find whole grains, look for the word "whole" as the first word in the ingredient list. Shopping   Buy products labeled as "low-sodium" or "no salt added."  Buy fresh foods. Avoid canned foods and premade or frozen meals. Cooking   Avoid adding salt when cooking. Use salt-free seasonings or herbs instead of table salt or sea salt. Check with your health care provider or pharmacist before using salt substitutes.  Do not fry foods. Cook foods using healthy methods such as baking, boiling, grilling, and broiling instead.  Cook with heart-healthy oils, such as olive, canola, soybean, or sunflower oil. Meal planning    Eat a balanced diet that includes:  5 or more servings of fruits and vegetables each day. At each meal, try to fill half of your plate with fruits and vegetables.  Up to 6-8 servings of whole grains each day.  Less than 6 oz of lean meat, poultry, or fish each day. A 3-oz serving of meat is about the same size as a deck of cards. One egg equals 1 oz.  2 servings of low-fat dairy each day.  A serving of nuts, seeds, or beans 5 times each week.  Heart-healthy fats. Healthy fats called Omega-3 fatty acids are found in foods such as flaxseeds and coldwater  fish, like sardines, salmon, and mackerel.  Limit how much you eat of the following:  Canned or prepackaged foods.  Food that is high in trans fat, such as fried foods.  Food that is high in saturated fat, such as fatty meat.  Sweets, desserts, sugary drinks, and other foods with added sugar.  Full-fat dairy products.  Do not salt foods before eating.  Try to eat at least 2 vegetarian meals each week.  Eat more home-cooked food and less restaurant, buffet, and fast food.  When eating at a restaurant, ask that your food be prepared with less salt or no salt, if possible. What foods are recommended? The items  listed may not be a complete list. Talk with your dietitian about what dietary choices are best for you. Grains  Whole-grain or whole-wheat bread. Whole-grain or whole-wheat pasta. Brown rice. Modena Morrow. Bulgur. Whole-grain and low-sodium cereals. Pita bread. Low-fat, low-sodium crackers. Whole-wheat flour tortillas. Vegetables  Fresh or frozen vegetables (raw, steamed, roasted, or grilled). Low-sodium or reduced-sodium tomato and vegetable juice. Low-sodium or reduced-sodium tomato sauce and tomato paste. Low-sodium or reduced-sodium canned vegetables. Fruits  All fresh, dried, or frozen fruit. Canned fruit in natural juice (without added sugar). Meat and other protein foods  Skinless chicken or [place]. Ground chicken or [place]. Pork with fat trimmed off. Fish and seafood. Egg whites. Dried beans, peas, or lentils. Unsalted nuts, nut butters, and seeds. Unsalted canned beans. Lean cuts of beef with fat trimmed off. Low-sodium, lean deli meat. Dairy  Low-fat (1%) or fat-free (skim) milk. Fat-free, low-fat, or reduced-fat cheeses. Nonfat, low-sodium ricotta or cottage cheese. Low-fat or nonfat yogurt. Low-fat, low-sodium cheese. Fats and oils  Soft margarine without trans fats. Vegetable oil. Low-fat, reduced-fat, or light mayonnaise and salad dressings (reduced-sodium). Canola, safflower, olive, soybean, and sunflower oils. Avocado. Seasoning and other foods  Herbs. Spices. Seasoning mixes without salt. Unsalted popcorn and pretzels. Fat-free sweets. What foods are not recommended? The items listed may not be a complete list. Talk with your dietitian about what dietary choices are best for you. Grains  Baked goods made with fat, such as croissants, muffins, or some breads. Dry pasta or rice meal packs. Vegetables  Creamed or fried vegetables. Vegetables in a cheese sauce. Regular canned vegetables (not low-sodium or reduced-sodium). Regular canned tomato sauce and paste (not low-sodium or  reduced-sodium). Regular tomato and vegetable juice (not low-sodium or reduced-sodium). Angie Fava. Olives. Fruits  Canned fruit in a light or heavy syrup. Fried fruit. Fruit in cream or butter sauce. Meat and other protein foods  Fatty cuts of meat. Ribs. Fried meat. Berniece Salines. Sausage. Bologna and other processed lunch meats. Salami. Fatback. Hotdogs. Bratwurst. Salted nuts and seeds. Canned beans with added salt. Canned or smoked fish. Whole eggs or egg yolks. Chicken or [place] with skin. Dairy  Whole or 2% milk, cream, and half-and-half. Whole or full-fat cream cheese. Whole-fat or sweetened yogurt. Full-fat cheese. Nondairy creamers. Whipped toppings. Processed cheese and cheese spreads. Fats and oils  Butter. Stick margarine. Lard. Shortening. Ghee. Bacon fat. Tropical oils, such as coconut, palm kernel, or palm oil. Seasoning and other foods  Salted popcorn and pretzels. Onion salt, garlic salt, seasoned salt, table salt, and sea salt. Worcestershire sauce. Tartar sauce. Barbecue sauce. Teriyaki sauce. Soy sauce, including reduced-sodium. Steak sauce. Canned and packaged gravies. Fish sauce. Oyster sauce. Cocktail sauce. Horseradish that you find on the shelf. Ketchup. Mustard. Meat flavorings and tenderizers. Bouillon cubes. Hot sauce and Tabasco sauce. Premade or packaged  marinades. Premade or packaged taco seasonings. Relishes. Regular salad dressings. Where to find more information:  National Heart, Lung, and [place]: https://wilson-eaton.com/  American Heart Association: www.heart.org Summary  The DASH eating plan is a healthy eating plan that has been shown to reduce high blood pressure (hypertension). It may also reduce your risk for type 2 diabetes, heart disease, and stroke.  With the DASH eating plan, you should limit salt (sodium) intake to 2,300 mg a day. If you have hypertension, you may need to reduce your sodium intake to 1,500 mg a day.  When on the DASH eating plan, aim to eat  more fresh fruits and vegetables, whole grains, lean proteins, low-fat dairy, and heart-healthy fats.  Work with your health care provider or diet and nutrition specialist (dietitian) to adjust your eating plan to your individual calorie needs. This information is not intended to replace advice given to you by your health care provider. Make sure you discuss any questions you have with your health care provider. Document Released: 08/06/2011 Document Revised: 08/10/2016 Document Reviewed: 08/10/2016 Elsevier Interactive Patient Education  2017 [employer].

## 2016-10-27 NOTE — Progress Notes (Signed)
   Subjective:    Patient ID: Cheryl Rowe, female    DOB: 1961/04/10, 56 y.o.   MRN: [id number]  HPI This patient was seen today for chronic pain. Takes for back pain. Surgery scheduled for March 13th.   The medication list was reviewed and updated.   -Compliance with medication: yes  - Number patient states they take daily: 5 - 6  A day  -when was the last dose patient took? today  The patient was advised the importance of maintaining medication and not using illegal substances with these.  Refills needed: yes  The patient was educated that we can provide 3 monthly scripts for their medication, it is their responsibility to follow the instructions.  Side effects or complications from medications: none  Patient is aware that pain medications are meant to minimize the severity of the pain to allow their pain levels to improve to allow for better function. They are aware of that pain medications cannot totally remove their pain.  Due for UDT ( at least once per year) : last one 07/2016  Has some concerns about upcoming surgery.         Review of Systems Currently relates mid back pain radiates into left leg. Denies nausea vomiting sweats chills fever denies headache chest tightness pressure pain    Objective:   Physical Exam Blood pressure elevated on 2 separate occurrences. Blood pressure best reading is 134/92 Lungs clear no crackles heart is regular pulse normal extremities no edema skin warm dry kyphosis noted in the back with subjected pain in the mid back and lower back radiation down the legs       Assessment & Plan:  Blood pressure significantly elevated patient has upcoming surgery I recommend starting lisinopril 5 mg, start off with half tablet daily, low-salt diet, follow-up if progressive troubles, send my chart message to [place] regarding blood pressures in one week  Mid back pain sciatica has back surgery coming up may use oxycodone every 4 hours as needed  maximum 6 per day once surgery is finishing pain doing better Back on medication to 4 per day follow-up in 6-8 weeks  Patient doing well from a mental health standpoint recommend reducing clonazepam to a half tablet 3 times daily if doing well with this try to cut back to half tablet twice a day

## 2016-10-29 ENCOUNTER — Telehealth: Payer: Self-pay | Admitting: Family Medicine

## 2016-10-29 MED ORDER — AMOXICILLIN-POT CLAVULANATE 875-125 MG PO TABS: 1.0000 | ORAL_TABLET | Freq: Two times a day (BID) | ORAL | 0 refills | Status: AC

## 2016-10-29 NOTE — Telephone Encounter (Signed)
Notified patient Augmentin 875 one twice a day for the next 10 days if dramatically better over the next 7 days could stop the medicine, rest as much is possible the next few days, if high fevers difficulty breathing or other worrisome symptoms must get checked out. Patient verbalized understanding. Med sent to pharmacy.

## 2016-10-29 NOTE — Telephone Encounter (Signed)
Augmentin 875 one twice a day for the next 10 days if dramatically better over the next 7 days could stop the medicine, rest as much is possible the next few days, if high fevers difficulty breathing or other worrisome symptoms must get checked out

## 2016-10-29 NOTE — Telephone Encounter (Signed)
Husband Cheryl Rowe was here on Monday and she was here on Tuesday.  Now she has what Cheryl Rowe has. Throat is raw. Sinuses draining down her throat. Congestion and some cough. Has upcoming surgery so wants to get an antibiotic started.  Can we call in meds to [facility] in [place]?

## 2016-10-29 NOTE — Telephone Encounter (Signed)
Patient has sore throat, drainage, congestion and cough. No other symptoms.

## 2016-10-30 ENCOUNTER — Ambulatory Visit: Payer: BLUE CROSS/BLUE SHIELD | Admitting: Family Medicine

## 2016-11-02 ENCOUNTER — Ambulatory Visit: Payer: BLUE CROSS/BLUE SHIELD | Admitting: Family Medicine

## 2016-11-03 ENCOUNTER — Inpatient Hospital Stay (HOSPITAL_COMMUNITY)
Admission: RE | Admit: 2016-11-03 | Discharge: 2016-11-03 | Disposition: A | Discharge status: Home / Self Care | Payer: Self-pay | Source: Ambulatory Visit

## 2016-11-03 NOTE — Pre-Procedure Instructions (Signed)
    Cheryl Rowe  11/03/2016      [place], [place] [address] Phone: [phone] Fax: [phone]  [place], [place] - [address] [place] [address] Phone: [phone] Fax: [phone]    Your procedure is scheduled on Tues. Mar. 13 @ 730 AM.  Report to [facility] Admitting @ 530 A.M.  Call this number if you have problems the morning of surgery:  [phone]   Remember:  Do not eat food or drink liquids after midnight.  Take these medicines the morning of surgery with A SIP OF WATER amoxicillin (augmentin), clonazepam (klonopin), lidocaine (lidoderm) patch),  Omeprazole (prilosec), oxycodone-acetaminophen (percocet) if needed.  Stop taking any herbal medications or vitamins. No Goody's, Bc's, Ibuprofen, Advil, Aleve, Motrin, or fish oil.   Do not wear jewelry, make-up or nail polish.  Do not wear lotions, powders, or perfumes, or deoderant.  Do not shave 48 hours prior to surgery.    Do not bring valuables to the hospital.  [facility] is not responsible for any belongings or valuables.  Contacts, dentures or bridgework may not be worn into surgery.  Leave your suitcase in the car.  After surgery it may be brought to your room.  For patients admitted to the hospital, discharge time will be determined by your treatment team.  Patients discharged the day of surgery will not be allowed to drive home.    Special instructions:  See attached  Please read over the following fact sheets that you were given. Pain Booklet, Coughing and Deep Breathing, MRSA Information and Surgical Site Infection Prevention

## 2016-11-03 NOTE — Progress Notes (Signed)
PCP:  Cardiologist:  EKG:  Stress Test:  ECHO:  Cardiac Cath:  Chest x-ray:

## 2016-11-04 ENCOUNTER — Encounter: Payer: Self-pay | Admitting: Family Medicine

## 2016-11-05 ENCOUNTER — Telehealth: Payer: Self-pay | Admitting: Family Medicine

## 2016-11-05 NOTE — Telephone Encounter (Signed)
Spoke with patient and informed her of the information that [facility] is now requesting. Patient stated that she does not feel comfortable with having her information released so she will not be getting her pain meds filled there anymore. [facility]

## 2016-11-05 NOTE — Telephone Encounter (Signed)
Nurse's-this request from [facility] in [place] is a new twist on pain management. #1 call [facility] find out if there require meant for office notes etc. is a [employer]? Or is this a [place] state law? #2 find out when the patient got her last prescription filled the exact date confirm the strength and the amount and document. #3 in order to send requested information it will be necessary for the patient to sign a release for [place] to send there. I'm not opposed to sending the last note and sending it for pain contract exists if that is what a long requires and the patient signs a release

## 2016-11-05 NOTE — Telephone Encounter (Signed)
Tried to call. Store does not open until 9. Will call the store back at [place].

## 2016-11-05 NOTE — Telephone Encounter (Signed)
Spoke with pharmacist at [facility] and was told that it is not [employer], but is a [employer] in accordance with the CDC that an office note with upcoming pain management appointment's listed be provided once per year to prove Doctor, and patient relationship. Will notify patient that information such as office visit, proof of upcoming appointments and dx codes must be sent into pharmacy but a release from our office giving [place] permission must be signed before we can send information.

## 2016-11-09 ENCOUNTER — Encounter (HOSPITAL_COMMUNITY): Payer: Self-pay

## 2016-11-09 ENCOUNTER — Encounter (HOSPITAL_COMMUNITY)
Admission: RE | Admit: 2016-11-09 | Discharge: 2016-11-09 | Disposition: A | Discharge status: Home / Self Care | Payer: BLUE CROSS/BLUE SHIELD | Source: Ambulatory Visit | Attending: Neurological Surgery | Admitting: Neurological Surgery

## 2016-11-09 HISTORY — DX: Presence of spectacles and contact lenses: Z97.3

## 2016-11-09 HISTORY — DX: Family history of other specified conditions: Z84.89

## 2016-11-09 HISTORY — DX: Gastro-esophageal reflux disease without esophagitis: K21.9

## 2016-11-09 HISTORY — DX: Spondylolisthesis, lumbosacral region: M43.17

## 2016-11-09 LAB — CBC
HCT: 43.7 % (ref 36.0–46.0)
Hemoglobin: 14.2 g/dL (ref 12.0–15.0)
MCH: 29.7 pg (ref 26.0–34.0)
MCHC: 32.5 g/dL (ref 30.0–36.0)
MCV: 91.4 fL (ref 78.0–100.0)
Platelets: 297 10*3/uL (ref 150–400)
RBC: 4.78 MIL/uL (ref 3.87–5.11)
RDW: 13.2 % (ref 11.5–15.5)
WBC: 8.2 10*3/uL (ref 4.0–10.5)

## 2016-11-09 LAB — BASIC METABOLIC PANEL
Anion gap: 8 (ref 5–15)
BUN: 12 mg/dL (ref 6–20)
CO2: 26 mmol/L (ref 22–32)
Calcium: 10.3 mg/dL (ref 8.9–10.3)
Chloride: 107 mmol/L (ref 101–111)
Creatinine, Ser: 0.69 mg/dL (ref 0.44–1.00)
GFR calc Af Amer: >60 mL/min (ref >60)
GFR calc non Af Amer: >60 mL/min (ref >60)
Glucose, Bld: 91 mg/dL (ref 65–99)
Potassium: 4.2 mmol/L (ref 3.5–5.1)
Sodium: 141 mmol/L (ref 135–145)

## 2016-11-09 LAB — SURGICAL PCR SCREEN
MRSA, PCR: NEGATIVE
Staphylococcus aureus: NEGATIVE

## 2016-11-09 LAB — TYPE AND SCREEN
ABO/RH(D): O POS
Antibody Screen: NEGATIVE

## 2016-11-09 LAB — ABO/RH: ABO/RH(D): O POS

## 2016-11-09 NOTE — Progress Notes (Signed)
Pt denies SOB, chest pain, and being under the care of a cardiologist. Pt stated that a stress test was performed in 2006  " everything was okay, it was when I had my Gall bladder surgery." Pt denies having an echo and cardiac cath. Pt denies having an EKG and chest x ray within the last year. Pt denies recent labs.

## 2016-11-09 NOTE — Pre-Procedure Instructions (Signed)
Cheryl Rowe  11/09/2016      [place], [place] [address] Phone: [phone] Fax: [phone]  Cheryl Rowe, [place] - [address] [address] [place] [address] Phone: [phone] Fax: [phone]    Your procedure is scheduled on Tuesday, November 10, 2016  Report to [facility] Admitting at 5:30 A.M.  Call this number if you have problems the morning of surgery:  [phone]   Remember:  Do not eat food or drink liquids after midnight.  Take these medicines the morning of surgery with A SIP OF WATER :omeprazole (PRILOSEC),  If needed: clonazePAM (KLONOPIN) for anxiety,  oxyCODONE ( Percocet)  for pain Stop taking Aspirin, vitamins, fish oil and herbal medications. Do not take any NSAIDs ie: Ibuprofen, Advil, Naproxen, BC and Goody Powder or any medication containing Aspirin such as Excedrin Migraine; stop now.  Do not wear jewelry, make-up or nail polish.  Do not wear lotions, powders, or perfumes, or deoderant.  Do not shave 48 hours prior to surgery.  Do not bring valuables to the hospital.  [facility] is not responsible for any belongings or valuables.  Contacts, dentures or bridgework may not be worn into surgery.  Leave your suitcase in the car.  After surgery it may be brought to your room. For patients admitted to the hospital, discharge time will be determined by your treatment team. Patients discharged the day of surgery will not be allowed to drive home.   Special instructions:  :  - Preparing for Surgery  Before surgery, you can play an important role.  Because skin is not sterile, your skin needs to be as free of germs as possible.  You can reduce the number of germs on you skin by washing with CHG (chlorahexidine gluconate) soap before surgery.  CHG is an antiseptic cleaner which kills germs and bonds with the skin to  continue killing germs even after washing.  Please DO NOT use if you have an allergy to CHG or antibacterial soaps.  If your skin becomes reddened/irritated stop using the CHG and inform your nurse when you arrive at Short Stay.  Do not shave (including legs and underarms) for at least 48 hours prior to the first CHG shower.  You may shave your face.  Please follow these instructions carefully:   1.  Shower with CHG Soap the night before surgery and the morning of Surgery.  2.  If you choose to wash your hair, wash your hair first as usual with your normal shampoo.  3.  After you shampoo, rinse your hair and body thoroughly to remove the Shampoo.  4.  Use CHG as you would any other liquid soap.  You can apply chg directly  to the skin and wash gently with scrungie or a clean washcloth.  5.  Apply the CHG Soap to your body ONLY FROM THE NECK DOWN.  Do not use on open wounds or open sores.  Avoid contact with your eyes, ears, mouth and genitals (private parts).  Wash genitals (private parts) with your normal soap.  6.  Wash thoroughly, paying special attention to the area where your surgery will be performed.  7.  Thoroughly rinse your body with warm water from the neck down.  8.  DO NOT shower/wash with your normal soap after using and rinsing off the CHG Soap.  9.  Cheryl Rowe  yourself dry with a clean towel.            10.  Wear clean pajamas.            11.  Place clean sheets on your bed the night of your first shower and do not sleep with pets.  Day of Surgery  Do not apply any lotions/deodorants the morning of surgery.  Please wear clean clothes to the hospital/surgery center. Please read over the following fact sheets that you were given. Pain Booklet, Coughing and Deep Breathing, Blood Transfusion Information, MRSA Information and Surgical Site Infection Prevention

## 2016-11-09 NOTE — Anesthesia Preprocedure Evaluation (Addendum)
Anesthesia Evaluation    Reviewed: Allergy & Precautions, Patient's Chart, lab work & pertinent test results  Airway Mallampati: II       Dental  (+) Teeth Intact   Pulmonary neg pulmonary ROS,    breath sounds clear to auscultation       Cardiovascular hypertension, negative cardio ROS   Rhythm:Regular Rate:Normal     Neuro/Psych  Headaches, PSYCHIATRIC DISORDERS Depression negative neurological ROS     GI/Hepatic negative GI ROS, Neg liver ROS, GERD  Controlled,  Endo/Other  negative endocrine ROS  Renal/GU negative Renal ROS     Musculoskeletal negative musculoskeletal ROS (+)   Abdominal   Peds  Hematology negative hematology ROS (+)   Anesthesia Other Findings Day of surgery medications reviewed with the patient.  Reproductive/Obstetrics                           Anesthesia Physical Anesthesia Plan  ASA: II  Anesthesia Plan: General   Post-op Pain Management:    Induction: Intravenous  Airway Management Planned: Oral ETT  Additional Equipment:   Intra-op Plan:   Post-operative Plan: Extubation in OR  Informed Consent: I have reviewed the patients History and Physical, chart, labs and discussed the procedure including the risks, benefits and alternatives for the proposed anesthesia with the patient or authorized representative who has indicated his/her understanding and acceptance.   Dental advisory given  Plan Discussed with: CRNA  Anesthesia Plan Comments:        Anesthesia Quick Evaluation

## 2016-11-10 ENCOUNTER — Inpatient Hospital Stay (HOSPITAL_COMMUNITY): Payer: BLUE CROSS/BLUE SHIELD | Admitting: Certified Registered Nurse Anesthetist

## 2016-11-10 ENCOUNTER — Encounter (HOSPITAL_COMMUNITY)
Admission: RE | Disposition: A | Discharge status: Home / Self Care | Payer: Self-pay | Source: Ambulatory Visit | Attending: Neurological Surgery

## 2016-11-10 ENCOUNTER — Inpatient Hospital Stay (HOSPITAL_COMMUNITY)
Admission: RE | Admit: 2016-11-10 | Discharge: 2016-11-12 | DRG: 455 | Disposition: A | Discharge status: Home / Self Care | Payer: BLUE CROSS/BLUE SHIELD | Source: Ambulatory Visit | Attending: Neurological Surgery | Admitting: Neurological Surgery

## 2016-11-10 ENCOUNTER — Inpatient Hospital Stay (HOSPITAL_COMMUNITY): Payer: BLUE CROSS/BLUE SHIELD

## 2016-11-10 DIAGNOSIS — Z79899 Other long term (current) drug therapy: Secondary | ICD-10-CM

## 2016-11-10 DIAGNOSIS — Z8249 Family history of ischemic heart disease and other diseases of the circulatory system: Secondary | ICD-10-CM

## 2016-11-10 DIAGNOSIS — F909 Attention-deficit hyperactivity disorder, unspecified type: Secondary | ICD-10-CM | POA: Diagnosis present

## 2016-11-10 DIAGNOSIS — K219 Gastro-esophageal reflux disease without esophagitis: Secondary | ICD-10-CM | POA: Diagnosis present

## 2016-11-10 DIAGNOSIS — M4317 Spondylolisthesis, lumbosacral region: Principal | ICD-10-CM | POA: Diagnosis present

## 2016-11-10 DIAGNOSIS — M4120 Other idiopathic scoliosis, site unspecified: Secondary | ICD-10-CM | POA: Diagnosis present

## 2016-11-10 DIAGNOSIS — F329 Major depressive disorder, single episode, unspecified: Secondary | ICD-10-CM | POA: Diagnosis present

## 2016-11-10 DIAGNOSIS — I10 Essential (primary) hypertension: Secondary | ICD-10-CM | POA: Diagnosis present

## 2016-11-10 DIAGNOSIS — Z888 Allergy status to other drugs, medicaments and biological substances status: Secondary | ICD-10-CM | POA: Diagnosis not present

## 2016-11-10 DIAGNOSIS — Z419 Encounter for procedure for purposes other than remedying health state, unspecified: Secondary | ICD-10-CM

## 2016-11-10 DIAGNOSIS — E785 Hyperlipidemia, unspecified: Secondary | ICD-10-CM | POA: Diagnosis present

## 2016-11-10 DIAGNOSIS — M549 Dorsalgia, unspecified: Secondary | ICD-10-CM | POA: Diagnosis present

## 2016-11-10 SURGERY — POSTERIOR LUMBAR FUSION 2 LEVEL
Anesthesia: General | Site: Back

## 2016-11-10 MED ORDER — VENLAFAXINE HCL ER 75 MG PO TB24: 225.0000 mg | ORAL_TABLET | Freq: Every day | ORAL | Status: DC

## 2016-11-10 MED ORDER — ACETAMINOPHEN 10 MG/ML IV SOLN
INTRAVENOUS | Status: AC
  Filled 2016-11-10: qty 100

## 2016-11-10 MED ORDER — THROMBIN 5000 UNITS EX SOLR
CUTANEOUS | Status: AC
  Filled 2016-11-10: qty 5000

## 2016-11-10 MED ORDER — ACETAMINOPHEN 650 MG RE SUPP: 650.0000 mg | RECTAL | Status: DC | PRN

## 2016-11-10 MED ORDER — ONDANSETRON HCL 4 MG PO TABS: 4.0000 mg | ORAL_TABLET | Freq: Four times a day (QID) | ORAL | Status: DC | PRN

## 2016-11-10 MED ORDER — PHENYLEPHRINE HCL 10 MG/ML IJ SOLN: INTRAMUSCULAR | Status: DC | PRN

## 2016-11-10 MED ORDER — LIDOCAINE 2% (20 MG/ML) 5 ML SYRINGE
INTRAMUSCULAR | Status: DC | PRN
  Administered 2016-11-10: 80 mg via INTRAVENOUS

## 2016-11-10 MED ORDER — ONDANSETRON HCL 4 MG/2ML IJ SOLN: 4.0000 mg | Freq: Four times a day (QID) | INTRAMUSCULAR | Status: DC | PRN

## 2016-11-10 MED ORDER — METHOCARBAMOL 500 MG PO TABS
ORAL_TABLET | ORAL | Status: AC
  Filled 2016-11-10: qty 1

## 2016-11-10 MED ORDER — OXYCODONE HCL 5 MG PO TABS
5.0000 mg | ORAL_TABLET | ORAL | Status: DC | PRN
  Administered 2016-11-10: 5 mg via ORAL
  Administered 2016-11-10 – 2016-11-12 (×6): 10 mg via ORAL
  Filled 2016-11-10 (×7): qty 2

## 2016-11-10 MED ORDER — MEPERIDINE HCL 25 MG/ML IJ SOLN: 6.2500 mg | INTRAMUSCULAR | Status: DC | PRN

## 2016-11-10 MED ORDER — PROPOFOL 10 MG/ML IV BOLUS
INTRAVENOUS | Status: AC
  Filled 2016-11-10: qty 40

## 2016-11-10 MED ORDER — ALUM & MAG HYDROXIDE-SIMETH 200-200-20 MG/5ML PO SUSP: 30.0000 mL | Freq: Four times a day (QID) | ORAL | Status: DC | PRN

## 2016-11-10 MED ORDER — BUPIVACAINE HCL (PF) 0.5 % IJ SOLN
INTRAMUSCULAR | Status: AC
  Filled 2016-11-10: qty 30

## 2016-11-10 MED ORDER — HYDROMORPHONE HCL 1 MG/ML IJ SOLN
INTRAMUSCULAR | Status: AC
  Filled 2016-11-10: qty 1

## 2016-11-10 MED ORDER — LISINOPRIL 2.5 MG PO TABS
2.5000 mg | ORAL_TABLET | Freq: Every day | ORAL | Status: DC
  Administered 2016-11-11 – 2016-11-12 (×2): 2.5 mg via ORAL
  Filled 2016-11-10 (×3): qty 1

## 2016-11-10 MED ORDER — PHENOL 1.4 % MT LIQD: 1.0000 | OROMUCOSAL | Status: DC | PRN

## 2016-11-10 MED ORDER — MIDAZOLAM HCL 2 MG/2ML IJ SOLN
INTRAMUSCULAR | Status: AC
  Filled 2016-11-10: qty 4

## 2016-11-10 MED ORDER — HYDROMORPHONE HCL 1 MG/ML IJ SOLN
INTRAMUSCULAR | Status: DC | PRN
  Administered 2016-11-10 (×2): .25 mg via INTRAVENOUS
  Administered 2016-11-10: 0.5 mg via INTRAVENOUS

## 2016-11-10 MED ORDER — PROPOFOL 10 MG/ML IV BOLUS
INTRAVENOUS | Status: DC | PRN
  Administered 2016-11-10: 170 mg via INTRAVENOUS

## 2016-11-10 MED ORDER — SODIUM CHLORIDE 0.9 % IJ SOLN
INTRAMUSCULAR | Status: AC
  Filled 2016-11-10: qty 10

## 2016-11-10 MED ORDER — PHENYLEPHRINE 40 MCG/ML (10ML) SYRINGE FOR IV PUSH (FOR BLOOD PRESSURE SUPPORT)
PREFILLED_SYRINGE | INTRAVENOUS | Status: AC
  Filled 2016-11-10: qty 10

## 2016-11-10 MED ORDER — BENEFIBER PO POWD: 1.0000 | Freq: Every day | ORAL | Status: DC

## 2016-11-10 MED ORDER — LIDOCAINE-EPINEPHRINE 2 %-1:100000 IJ SOLN
INTRAMUSCULAR | Status: DC | PRN
  Administered 2016-11-10: 5 mL

## 2016-11-10 MED ORDER — DEXAMETHASONE SODIUM PHOSPHATE 10 MG/ML IJ SOLN
INTRAMUSCULAR | Status: DC | PRN
  Administered 2016-11-10: 10 mg via INTRAVENOUS

## 2016-11-10 MED ORDER — ONDANSETRON HCL 4 MG/2ML IJ SOLN
INTRAMUSCULAR | Status: DC | PRN
  Administered 2016-11-10 (×2): 4 mg via INTRAVENOUS

## 2016-11-10 MED ORDER — SENNA 8.6 MG PO TABS
1.0000 | ORAL_TABLET | Freq: Two times a day (BID) | ORAL | Status: DC
  Administered 2016-11-10 – 2016-11-12 (×5): 8.6 mg via ORAL
  Filled 2016-11-10 (×5): qty 1

## 2016-11-10 MED ORDER — CLONAZEPAM 0.5 MG PO TABS
0.5000 mg | ORAL_TABLET | Freq: Two times a day (BID) | ORAL | Status: DC | PRN
  Administered 2016-11-11 (×2): 0.5 mg via ORAL
  Filled 2016-11-10 (×2): qty 1

## 2016-11-10 MED ORDER — PANTOPRAZOLE SODIUM 40 MG PO TBEC
40.0000 mg | DELAYED_RELEASE_TABLET | Freq: Every day | ORAL | Status: DC
  Administered 2016-11-11 – 2016-11-12 (×2): 40 mg via ORAL
  Filled 2016-11-10 (×2): qty 1

## 2016-11-10 MED ORDER — DOCUSATE SODIUM 100 MG PO CAPS
100.0000 mg | ORAL_CAPSULE | Freq: Two times a day (BID) | ORAL | Status: DC
  Administered 2016-11-10 – 2016-11-12 (×5): 100 mg via ORAL
  Filled 2016-11-10 (×5): qty 1

## 2016-11-10 MED ORDER — ROCURONIUM BROMIDE 50 MG/5ML IV SOSY
PREFILLED_SYRINGE | INTRAVENOUS | Status: AC
  Filled 2016-11-10: qty 5

## 2016-11-10 MED ORDER — SODIUM CHLORIDE 0.9% FLUSH
3.0000 mL | Freq: Two times a day (BID) | INTRAVENOUS | Status: DC
  Administered 2016-11-10 – 2016-11-11 (×3): 3 mL via INTRAVENOUS

## 2016-11-10 MED ORDER — FENTANYL CITRATE (PF) 100 MCG/2ML IJ SOLN
INTRAMUSCULAR | Status: DC | PRN
  Administered 2016-11-10 (×8): 50 ug via INTRAVENOUS

## 2016-11-10 MED ORDER — POLYETHYLENE GLYCOL 3350 17 G PO PACK: 17.0000 g | PACK | Freq: Every day | ORAL | Status: DC | PRN

## 2016-11-10 MED ORDER — CEFAZOLIN SODIUM 1 G IJ SOLR
INTRAMUSCULAR | Status: AC
  Filled 2016-11-10: qty 20

## 2016-11-10 MED ORDER — OXYCODONE HCL 5 MG PO TABS
ORAL_TABLET | ORAL | Status: AC
  Filled 2016-11-10: qty 1

## 2016-11-10 MED ORDER — 0.9 % SODIUM CHLORIDE (POUR BTL) OPTIME
TOPICAL | Status: DC | PRN
  Administered 2016-11-10: 1000 mL

## 2016-11-10 MED ORDER — HYDROMORPHONE HCL 1 MG/ML IJ SOLN
INTRAMUSCULAR | Status: AC
Start: 2016-11-10 — End: 2016-11-10
  Filled 2016-11-10: qty 1

## 2016-11-10 MED ORDER — FENTANYL CITRATE (PF) 100 MCG/2ML IJ SOLN
INTRAMUSCULAR | Status: AC
  Filled 2016-11-10: qty 4

## 2016-11-10 MED ORDER — ONDANSETRON HCL 4 MG/2ML IJ SOLN
INTRAMUSCULAR | Status: AC
  Filled 2016-11-10: qty 2

## 2016-11-10 MED ORDER — CEFAZOLIN SODIUM-DEXTROSE 2-4 GM/100ML-% IV SOLN
2.0000 g | Freq: Three times a day (TID) | INTRAVENOUS | Status: AC
  Administered 2016-11-10 – 2016-11-11 (×2): 2 g via INTRAVENOUS
  Filled 2016-11-10 (×2): qty 100

## 2016-11-10 MED ORDER — SUGAMMADEX SODIUM 200 MG/2ML IV SOLN
INTRAVENOUS | Status: DC | PRN
  Administered 2016-11-10 (×2): 200 mg via INTRAVENOUS

## 2016-11-10 MED ORDER — ROCURONIUM BROMIDE 10 MG/ML (PF) SYRINGE
PREFILLED_SYRINGE | INTRAVENOUS | Status: DC | PRN
  Administered 2016-11-10: 50 mg via INTRAVENOUS
  Administered 2016-11-10: 30 mg via INTRAVENOUS
  Administered 2016-11-10 (×2): 20 mg via INTRAVENOUS

## 2016-11-10 MED ORDER — HYDROMORPHONE HCL 1 MG/ML IJ SOLN
1.0000 mg | INTRAMUSCULAR | Status: DC | PRN
  Administered 2016-11-10 – 2016-11-11 (×3): 1 mg via INTRAVENOUS
  Filled 2016-11-10 (×3): qty 1

## 2016-11-10 MED ORDER — OXYCODONE-ACETAMINOPHEN 5-325 MG PO TABS
1.0000 | ORAL_TABLET | ORAL | Status: DC | PRN
Start: 2016-11-10 — End: 2016-11-12
  Administered 2016-11-10 – 2016-11-12 (×4): 1 via ORAL
  Filled 2016-11-10 (×4): qty 1

## 2016-11-10 MED ORDER — SODIUM CHLORIDE 0.9 % IV SOLN: INTRAVENOUS | Status: DC

## 2016-11-10 MED ORDER — SODIUM CHLORIDE 0.9% FLUSH: 3.0000 mL | INTRAVENOUS | Status: DC | PRN

## 2016-11-10 MED ORDER — AMOXICILLIN-POT CLAVULANATE 875-125 MG PO TABS: 1.0000 | ORAL_TABLET | Freq: Two times a day (BID) | ORAL | Status: DC

## 2016-11-10 MED ORDER — DEXAMETHASONE 4 MG PO TABS
2.0000 mg | ORAL_TABLET | Freq: Two times a day (BID) | ORAL | Status: DC
  Administered 2016-11-10 – 2016-11-12 (×4): 2 mg via ORAL
  Filled 2016-11-10 (×4): qty 1

## 2016-11-10 MED ORDER — PSYLLIUM 95 % PO PACK
1.0000 | PACK | Freq: Every day | ORAL | Status: DC
  Administered 2016-11-10 – 2016-11-11 (×2): 1 via ORAL
  Filled 2016-11-10 (×2): qty 1

## 2016-11-10 MED ORDER — MIDAZOLAM HCL 2 MG/2ML IJ SOLN
INTRAMUSCULAR | Status: DC | PRN
  Administered 2016-11-10: 2 mg via INTRAVENOUS

## 2016-11-10 MED ORDER — DEXAMETHASONE SODIUM PHOSPHATE 10 MG/ML IJ SOLN
INTRAMUSCULAR | Status: AC
  Filled 2016-11-10: qty 1

## 2016-11-10 MED ORDER — THROMBIN 20000 UNITS EX SOLR
CUTANEOUS | Status: AC
  Filled 2016-11-10: qty 20000

## 2016-11-10 MED ORDER — METHOCARBAMOL 1000 MG/10ML IJ SOLN
500.0000 mg | Freq: Four times a day (QID) | INTRAVENOUS | Status: DC | PRN
  Filled 2016-11-10: qty 5

## 2016-11-10 MED ORDER — HYDROMORPHONE HCL 1 MG/ML IJ SOLN
0.2500 mg | INTRAMUSCULAR | Status: DC | PRN
  Administered 2016-11-10 (×3): 0.5 mg via INTRAVENOUS

## 2016-11-10 MED ORDER — SODIUM CHLORIDE 0.9 % IV SOLN
INTRAVENOUS | Status: DC | PRN
  Administered 2016-11-10: 11:00:00 via INTRAVENOUS

## 2016-11-10 MED ORDER — PHENYLEPHRINE HCL 10 MG/ML IJ SOLN
INTRAMUSCULAR | Status: AC
  Filled 2016-11-10: qty 1

## 2016-11-10 MED ORDER — THROMBIN 20000 UNITS EX SOLR
CUTANEOUS | Status: DC | PRN
  Administered 2016-11-10: 09:00:00 via TOPICAL

## 2016-11-10 MED ORDER — BUPIVACAINE HCL (PF) 0.5 % IJ SOLN
INTRAMUSCULAR | Status: DC | PRN
  Administered 2016-11-10: 20 mL
  Administered 2016-11-10: 5 mL

## 2016-11-10 MED ORDER — SUGAMMADEX SODIUM 200 MG/2ML IV SOLN
INTRAVENOUS | Status: AC
  Filled 2016-11-10: qty 2

## 2016-11-10 MED ORDER — DEXAMETHASONE SODIUM PHOSPHATE 4 MG/ML IJ SOLN: 2.0000 mg | Freq: Two times a day (BID) | INTRAMUSCULAR | Status: DC

## 2016-11-10 MED ORDER — PHENYLEPHRINE HCL 10 MG/ML IJ SOLN
INTRAVENOUS | Status: DC | PRN
  Administered 2016-11-10: 20 ug/min via INTRAVENOUS
  Administered 2016-11-10: 12:00:00 via INTRAVENOUS

## 2016-11-10 MED ORDER — LIDOCAINE-EPINEPHRINE 2 %-1:100000 IJ SOLN
INTRAMUSCULAR | Status: AC
  Filled 2016-11-10: qty 1

## 2016-11-10 MED ORDER — SODIUM CHLORIDE 0.9 % IV SOLN
250.0000 mL | INTRAVENOUS | Status: DC
Start: 2016-11-10 — End: 2016-11-12

## 2016-11-10 MED ORDER — EPHEDRINE SULFATE-NACL 50-0.9 MG/10ML-% IV SOSY
PREFILLED_SYRINGE | INTRAVENOUS | Status: DC | PRN
  Administered 2016-11-10 (×2): 5 mg via INTRAVENOUS

## 2016-11-10 MED ORDER — LIDOCAINE 2% (20 MG/ML) 5 ML SYRINGE
INTRAMUSCULAR | Status: AC
  Filled 2016-11-10: qty 5

## 2016-11-10 MED ORDER — BISACODYL 10 MG RE SUPP: 10.0000 mg | Freq: Every day | RECTAL | Status: DC | PRN

## 2016-11-10 MED ORDER — OXYCODONE HCL 5 MG PO TABS
5.0000 mg | ORAL_TABLET | ORAL | Status: DC | PRN
  Administered 2016-11-10 – 2016-11-12 (×4): 5 mg via ORAL
  Filled 2016-11-10 (×4): qty 1

## 2016-11-10 MED ORDER — ACETAMINOPHEN 325 MG PO TABS: 650.0000 mg | ORAL_TABLET | ORAL | Status: DC | PRN

## 2016-11-10 MED ORDER — TRAZODONE HCL 100 MG PO TABS
100.0000 mg | ORAL_TABLET | Freq: Every day | ORAL | Status: DC
  Administered 2016-11-10 – 2016-11-11 (×2): 100 mg via ORAL
  Filled 2016-11-10 (×2): qty 1

## 2016-11-10 MED ORDER — FLEET ENEMA 7-19 GM/118ML RE ENEM: 1.0000 | ENEMA | Freq: Once | RECTAL | Status: DC | PRN

## 2016-11-10 MED ORDER — CEFAZOLIN SODIUM-DEXTROSE 2-4 GM/100ML-% IV SOLN
2.0000 g | INTRAVENOUS | Status: DC
  Filled 2016-11-10: qty 100

## 2016-11-10 MED ORDER — PROMETHAZINE HCL 25 MG/ML IJ SOLN: 6.2500 mg | INTRAMUSCULAR | Status: DC | PRN

## 2016-11-10 MED ORDER — SODIUM CHLORIDE 0.9 % IR SOLN
Status: DC | PRN
  Administered 2016-11-10: 09:00:00

## 2016-11-10 MED ORDER — METHOCARBAMOL 500 MG PO TABS
500.0000 mg | ORAL_TABLET | Freq: Four times a day (QID) | ORAL | Status: DC | PRN
  Administered 2016-11-10 – 2016-11-12 (×6): 500 mg via ORAL
  Filled 2016-11-10 (×6): qty 1

## 2016-11-10 MED ORDER — PHENYLEPHRINE 40 MCG/ML (10ML) SYRINGE FOR IV PUSH (FOR BLOOD PRESSURE SUPPORT)
PREFILLED_SYRINGE | INTRAVENOUS | Status: DC | PRN
  Administered 2016-11-10: 40 ug via INTRAVENOUS
  Administered 2016-11-10: 120 ug via INTRAVENOUS
  Administered 2016-11-10: 40 ug via INTRAVENOUS
  Administered 2016-11-10 (×2): 120 ug via INTRAVENOUS
  Administered 2016-11-10: 80 ug via INTRAVENOUS

## 2016-11-10 MED ORDER — CEFAZOLIN SODIUM 1 G IJ SOLR
INTRAMUSCULAR | Status: DC | PRN
  Administered 2016-11-10: 2 g via INTRAMUSCULAR

## 2016-11-10 MED ORDER — THROMBIN 5000 UNITS EX SOLR
OROMUCOSAL | Status: DC | PRN
  Administered 2016-11-10 (×2): via TOPICAL

## 2016-11-10 MED ORDER — EPHEDRINE 5 MG/ML INJ
INTRAVENOUS | Status: AC
  Filled 2016-11-10: qty 10

## 2016-11-10 MED ORDER — LACTATED RINGERS IV SOLN
INTRAVENOUS | Status: DC | PRN
  Administered 2016-11-10 (×3): via INTRAVENOUS

## 2016-11-10 MED ORDER — CEFAZOLIN SODIUM-DEXTROSE 2-3 GM-% IV SOLR
INTRAVENOUS | Status: DC | PRN
  Administered 2016-11-10 (×2): 2 g via INTRAVENOUS

## 2016-11-10 MED ORDER — VENLAFAXINE HCL ER 75 MG PO CP24
225.0000 mg | ORAL_CAPSULE | Freq: Every day | ORAL | Status: DC
  Administered 2016-11-10 – 2016-11-11 (×2): 225 mg via ORAL
  Filled 2016-11-10 (×2): qty 3

## 2016-11-10 MED ORDER — ACETAMINOPHEN 10 MG/ML IV SOLN
INTRAVENOUS | Status: DC | PRN
  Administered 2016-11-10: 1000 mg via INTRAVENOUS

## 2016-11-10 MED ORDER — OXYCODONE-ACETAMINOPHEN 10-325 MG PO TABS: 1.0000 | ORAL_TABLET | ORAL | Status: DC | PRN

## 2016-11-10 MED ORDER — MENTHOL 3 MG MT LOZG: 1.0000 | LOZENGE | OROMUCOSAL | Status: DC | PRN

## 2016-11-10 MED FILL — Sodium Chloride IV Soln 0.9%: INTRAVENOUS | Qty: 1000 | Status: AC

## 2016-11-10 MED FILL — Heparin Sodium (Porcine) Inj 1000 Unit/ML: INTRAMUSCULAR | Qty: 30 | Status: AC

## 2016-11-10 SURGICAL SUPPLY — 77 items
ADH SKN CLS APL DERMABOND .7 (GAUZE/BANDAGES/DRESSINGS) ×1
APL SRG 60D 8 XTD TIP BNDBL (TIP)
BAG DECANTER FOR FLEXI CONT (MISCELLANEOUS) ×3 IMPLANT
BASKET BONE COLLECTION (BASKET) ×2 IMPLANT
BLADE CLIPPER SURG (BLADE) IMPLANT
BONE CANC CHIPS 40CC CAN1/2 (Bone Implant) ×3 IMPLANT
BUR MATCHSTICK NEURO 3.0 LAGG (BURR) ×3 IMPLANT
CAGE COROENT PLIF 10X28-8 LUMB (Cage) ×4 IMPLANT
CAGE MAS PLIF 9X9X23-8 LUMBAR (Cage) ×4 IMPLANT
CANISTER SUCT 3000ML PPV (MISCELLANEOUS) ×3 IMPLANT
CARTRIDGE OIL MAESTRO DRILL (MISCELLANEOUS) ×1 IMPLANT
CHIPS CANC BONE 40CC CAN1/2 (Bone Implant) ×1 IMPLANT
CONT SPEC 4OZ CLIKSEAL STRL BL (MISCELLANEOUS) ×3 IMPLANT
COVER BACK TABLE 60X90IN (DRAPES) ×3 IMPLANT
DECANTER SPIKE VIAL GLASS SM (MISCELLANEOUS) ×5 IMPLANT
DERMABOND ADVANCED (GAUZE/BANDAGES/DRESSINGS) ×2
DERMABOND ADVANCED .7 DNX12 (GAUZE/BANDAGES/DRESSINGS) ×1 IMPLANT
DEVICE DISSECT PLASMABLAD 3.0S (MISCELLANEOUS) ×1 IMPLANT
DIFFUSER DRILL AIR PNEUMATIC (MISCELLANEOUS) ×1 IMPLANT
DRAPE C-ARM 42X72 X-RAY (DRAPES) ×6 IMPLANT
DRAPE HALF SHEET 40X57 (DRAPES) IMPLANT
DRAPE LAPAROTOMY 100X72X124 (DRAPES) ×3 IMPLANT
DRAPE POUCH INSTRU U-SHP 10X18 (DRAPES) ×3 IMPLANT
DRSG OPSITE POSTOP 4X8 (GAUZE/BANDAGES/DRESSINGS) ×2 IMPLANT
DURAPREP 26ML APPLICATOR (WOUND CARE) ×3 IMPLANT
DURASEAL APPLICATOR TIP (TIP) IMPLANT
DURASEAL SPINE SEALANT 3ML (MISCELLANEOUS) IMPLANT
ELECT REM PT RETURN 9FT ADLT (ELECTROSURGICAL) ×3
ELECTRODE REM PT RTRN 9FT ADLT (ELECTROSURGICAL) ×1 IMPLANT
GAUZE SPONGE 4X4 12PLY STRL (GAUZE/BANDAGES/DRESSINGS) ×1 IMPLANT
GAUZE SPONGE 4X4 16PLY XRAY LF (GAUZE/BANDAGES/DRESSINGS) IMPLANT
GLOVE BIO SURGEON STRL SZ8 (GLOVE) ×2 IMPLANT
GLOVE BIOGEL PI IND STRL 6.5 (GLOVE) IMPLANT
GLOVE BIOGEL PI IND STRL 8.5 (GLOVE) ×2 IMPLANT
GLOVE BIOGEL PI INDICATOR 6.5 (GLOVE) ×2
GLOVE BIOGEL PI INDICATOR 8.5 (GLOVE) ×6
GLOVE ECLIPSE 8.5 STRL (GLOVE) ×6 IMPLANT
GLOVE SURG SS PI 6.5 STRL IVOR (GLOVE) ×6 IMPLANT
GOWN STRL REUS W/ TWL LRG LVL3 (GOWN DISPOSABLE) IMPLANT
GOWN STRL REUS W/ TWL XL LVL3 (GOWN DISPOSABLE) IMPLANT
GOWN STRL REUS W/TWL 2XL LVL3 (GOWN DISPOSABLE) ×6 IMPLANT
GOWN STRL REUS W/TWL LRG LVL3 (GOWN DISPOSABLE) ×3
GOWN STRL REUS W/TWL XL LVL3 (GOWN DISPOSABLE) ×3
GRAFT BNE CHIP CANC 1-8 40 (Bone Implant) IMPLANT
HEMOSTAT POWDER KIT SURGIFOAM (HEMOSTASIS) ×4 IMPLANT
KIT BASIN OR (CUSTOM PROCEDURE TRAY) ×3 IMPLANT
KIT INFUSE SMALL (Orthopedic Implant) ×2 IMPLANT
KIT ROOM TURNOVER OR (KITS) ×3 IMPLANT
MILL MEDIUM DISP (BLADE) ×2 IMPLANT
MODULE POWER NUVASIVE (MISCELLANEOUS) IMPLANT
NDL SPNL 18GX3.5 QUINCKE PK (NEEDLE) IMPLANT
NEEDLE HYPO 22GX1.5 SAFETY (NEEDLE) ×3 IMPLANT
NEEDLE SPNL 18GX3.5 QUINCKE PK (NEEDLE) IMPLANT
NS IRRIG 1000ML POUR BTL (IV SOLUTION) ×3 IMPLANT
OIL CARTRIDGE MAESTRO DRILL (MISCELLANEOUS)
PACK LAMINECTOMY NEURO (CUSTOM PROCEDURE TRAY) ×3 IMPLANT
PAD ARMBOARD 7.5X6 YLW CONV (MISCELLANEOUS) ×9 IMPLANT
PATTIES SURGICAL .5 X1 (DISPOSABLE) ×3 IMPLANT
PLASMABLADE 3.0S (MISCELLANEOUS) ×3
POWER MODULE NUVASIVE (MISCELLANEOUS) ×3
ROD RELINE LOROTIC TI 5.5X55MM (Rod) ×4 IMPLANT
SCREW LOCK RELINE 5.5 TULIP (Screw) ×12 IMPLANT
SCREW RELINE-O 6.5X55 POLY (Screw) ×2 IMPLANT
SCREW RELINE-O POLY 6.5X50MM (Screw) ×10 IMPLANT
SPONGE LAP 4X18 X RAY DECT (DISPOSABLE) IMPLANT
SPONGE SURGIFOAM ABS GEL 100 (HEMOSTASIS) ×3 IMPLANT
SUT PROLENE 6 0 BV (SUTURE) IMPLANT
SUT VIC AB 1 CT1 18XBRD ANBCTR (SUTURE) ×1 IMPLANT
SUT VIC AB 1 CT1 8-18 (SUTURE) ×6
SUT VIC AB 2-0 CP2 18 (SUTURE) ×3 IMPLANT
SUT VIC AB 3-0 SH 8-18 (SUTURE) ×7 IMPLANT
SYR 3ML LL SCALE MARK (SYRINGE) ×12 IMPLANT
SYR 5ML LL (SYRINGE) ×2 IMPLANT
TOWEL GREEN STERILE (TOWEL DISPOSABLE) ×3 IMPLANT
TOWEL GREEN STERILE FF (TOWEL DISPOSABLE) ×2 IMPLANT
TRAY FOLEY W/METER SILVER 16FR (SET/KITS/TRAYS/PACK) ×3 IMPLANT
WATER STERILE IRR 1000ML POUR (IV SOLUTION) ×3 IMPLANT

## 2016-11-10 NOTE — Anesthesia Postprocedure Evaluation (Signed)
Anesthesia Post Note  Patient: Cheryl Rowe  Procedure(s) Performed: Procedure(s) (LRB): Lumbar four-five, Lumbar five-Sacral one Posterior lumbar interbody fusion (N/A)  Patient location during evaluation: PACU Anesthesia Type: General Level of consciousness: awake Pain management: pain level controlled Vital Signs Assessment: post-procedure vital signs reviewed and stable Respiratory status: spontaneous breathing Cardiovascular status: stable Postop Assessment: no signs of nausea or vomiting Anesthetic complications: no       Last Vitals:  Vitals:   11/10/16 1355 11/10/16 1400  BP:  94/76  Pulse: (!) 119 (!) 116  Resp: 13 13  Temp:      Last Pain:  Vitals:   11/10/16 1400  TempSrc:   PainSc: 8                  [employer]

## 2016-11-10 NOTE — Anesthesia Procedure Notes (Signed)
Procedure Name: Intubation Date/Time: 11/10/2016 7:45 AM Performed by: Mervyn Gay Pre-anesthesia Checklist: Patient identified, Patient being monitored, Timeout performed, Emergency Drugs available and Suction available Patient Re-evaluated:Patient Re-evaluated prior to inductionOxygen Delivery Method: Circle System Utilized Preoxygenation: Pre-oxygenation with 100% oxygen Intubation Type: IV induction Ventilation: Mask ventilation without difficulty Laryngoscope Size: Miller and 3 Grade View: Grade I Tube type: Oral Tube size: 7.5 mm Number of attempts: 1 Airway Equipment and Method: Stylet Placement Confirmation: ETT inserted through vocal cords under direct vision,  positive ETCO2 and breath sounds checked- equal and bilateral Secured at: 22 cm Tube secured with: Tape Dental Injury: Teeth and Oropharynx as per pre-operative assessment

## 2016-11-10 NOTE — H&P (Addendum)
Cheryl Rowe is an 56 y.o. female.   Chief Complaint: Back and bilateral leg pain, spondylolisthesis L4-L5, congenital scoliosis HPI: Patient is a 56 year old individual who's had significant history of idiopathic scoliosis since childhood. She has developed substantial episodes of back pain and leg pain is failed all efforts at conservative management. She was evaluated for scoliosis surgery was felt that it was not be in her best interest because she has had persistence of the pain in the recent past with refractory treatment to epidural steroid injections I have advised that local decompression at L4-5 and L5-S1 should be undertaken. She is now being admitted for that procedure.  Past Medical History:  Diagnosis Date  . ADHD (attention deficit hyperactivity disorder)   . Depression   . Family history of adverse reaction to anesthesia    sister has PONV  . GERD (gastroesophageal reflux disease)   . Hyperlipidemia   . Hypertension   . Migraine   . Scoliosis    Teenage  . Spondylolisthesis of lumbosacral region   . Wears glasses     Past Surgical History:  Procedure Laterality Date  . CERVICAL ABLATION    . CHOLECYSTECTOMY    . TONSILLECTOMY      Family History  Problem Relation Age of Onset  . Hypertension Father   . Arthritis Mother    Social History:  reports that she has never smoked. She has never used smokeless tobacco. She reports that she does not drink alcohol or use drugs.  Allergies:  Allergies  Allergen Reactions  . Pravastatin     Legs ache  . Cymbalta [Duloxetine Hcl] Other (See Comments)    Aggressive     Medications Prior to Admission  Medication Sig Dispense Refill  . aspirin-acetaminophen-caffeine (EXCEDRIN MIGRAINE) 250-250-65 MG tablet Take 2 tablets by mouth every 6 (six) hours as needed for headache.    . Calcium-Magnesium-Zinc (CAL-MAG-ZINC PO) Take 1 tablet by mouth 3 (three) times daily.    . clonazePAM (KLONOPIN) 0.5 MG tablet Take 1 tablet  (0.5 mg total) by mouth 2 (two) times daily as needed. for anxiety (Patient taking differently: Take 0.5 mg by mouth 2 (two) times daily. for anxiety) 60 tablet 4  . lisinopril (PRINIVIL,ZESTRIL) 5 MG tablet Take 1 tablet (5 mg total) by mouth daily. (Patient taking differently: Take 2.5 mg by mouth daily. ) 90 tablet 1  . Multiple Vitamin (MULTIVITAMIN WITH MINERALS) TABS tablet Take 1 tablet by mouth daily.    Marland Kitchen omeprazole (PRILOSEC) 40 MG capsule Take 1 capsule (40 mg total) by mouth daily. 90 capsule 0  . oxyCODONE-acetaminophen (PERCOCET) 10-325 MG tablet One q4 hours prn pain max 6 a day (Patient taking differently: Take 1 tablet by mouth every 4 (four) hours as needed for pain. Maximum of 6 tablets daily) 180 tablet 0  . traZODone (DESYREL) 100 MG tablet Take 1 tablet (100 mg total) by mouth at bedtime. 90 tablet 3  . Venlafaxine HCl 75 MG TB24 TAKE THREE TABLETS BY MOUTH AT NIGHT (Patient taking differently: Take 225 mg by mouth at bedtime. ) 270 tablet 2  . Wheat Dextrin (BENEFIBER) POWD Take 1 scoop by mouth at bedtime.    Marland Kitchen amoxicillin-clavulanate (AUGMENTIN) 875-125 MG tablet Take 1 tablet by mouth 2 (two) times daily. X 10 days 20 tablet 0  . lidocaine (LIDODERM) 5 % PLACE ONE PATCH ONTO THE SKIN DAILY-REMOVE AND DISCARD PATCH WITHIN 12 HOURS AS DIRECTED BY MD (Patient taking differently: PLACE 1 PATCH ONTO SKIN  DAILY AS NEEDED FOR PAIN (REMOVE & DISCARD PATCH WITH 12 HRS)) 30 patch 2    Results for orders placed or performed during the hospital encounter of 11/09/16 (from the past 48 hour(s))  Surgical pcr screen     Status: None   Collection Time: 11/09/16  2:15 PM  Result Value Ref Range   MRSA, PCR NEGATIVE NEGATIVE   Staphylococcus aureus NEGATIVE NEGATIVE    Comment:        The Xpert SA Assay (FDA approved for NASAL specimens in patients over 17 years of age), is one component of a comprehensive surveillance program.  Test performance has been validated by [facility] for  patients greater than or equal to 70 year old. It is not intended to diagnose infection nor to guide or monitor treatment.   Type and screen     Status: None   Collection Time: 11/09/16  2:15 PM  Result Value Ref Range   ABO/RH(D) O POS    Antibody Screen NEG    Sample Expiration 92/44/6286   Basic metabolic panel     Status: None   Collection Time: 11/09/16  2:15 PM  Result Value Ref Range   Sodium 141 135 - 145 mmol/L   Potassium 4.2 3.5 - 5.1 mmol/L   Chloride 107 101 - 111 mmol/L   CO2 26 22 - 32 mmol/L   Glucose, Bld 91 65 - 99 mg/dL   BUN 12 6 - 20 mg/dL   Creatinine, Ser 0.69 0.44 - 1.00 mg/dL   Calcium 10.3 8.9 - 10.3 mg/dL   GFR calc non Af Amer >60 >60 mL/min   GFR calc Af Amer >60 >60 mL/min    Comment: (NOTE) The eGFR has been calculated using the CKD EPI equation. This calculation has not been validated in all clinical situations. eGFR's persistently <60 mL/min signify possible Chronic Kidney Disease.    Anion gap 8 5 - 15  CBC     Status: None   Collection Time: 11/09/16  2:15 PM  Result Value Ref Range   WBC 8.2 4.0 - 10.5 K/uL   RBC 4.78 3.87 - 5.11 MIL/uL   Hemoglobin 14.2 12.0 - 15.0 g/dL   HCT 43.7 36.0 - 46.0 %   MCV 91.4 78.0 - 100.0 fL   MCH 29.7 26.0 - 34.0 pg   MCHC 32.5 30.0 - 36.0 g/dL   RDW 13.2 11.5 - 15.5 %   Platelets 297 150 - 400 K/uL  ABO/Rh     Status: None   Collection Time: 11/09/16  2:31 PM  Result Value Ref Range   ABO/RH(D) O POS    No results found.  Review of Systems  HENT: Negative.   Eyes: Negative.   Respiratory: Negative.   Cardiovascular: Negative.   Gastrointestinal: Negative.   Genitourinary: Negative.   Musculoskeletal: Positive for back pain.  Skin: Negative.   Neurological: Positive for weakness.       Bilateral leg pain, history of scoliosis, idiopathic  Endo/Heme/Allergies: Negative.   Psychiatric/Behavioral: Negative.     Blood pressure 126/69, pulse 76, temperature 97.9 F (36.6 C), temperature  source Oral, resp. rate 18, SpO2 100 %. Physical Exam  Constitutional: She appears well-developed and well-nourished.  HENT:  Head: Atraumatic.  Eyes: Conjunctivae and EOM are normal. Pupils are equal, round, and reactive to light.  Neck: Normal range of motion.  Cardiovascular: Normal rate.   Respiratory: Effort normal and breath sounds normal.  GI: Soft. Bowel sounds are normal.  Musculoskeletal:  Positive straight leg raising at 45. Easily visualizable scoliosis with right rib hump. Flexion lowered reduces curvature but rib hump remains prominent. Extension accentuates pain.  Neurological:  Alert and oriented cranial nerve examination is within limits of normal upper extremity strength and reflexes are normal lower extremity reveals mild weakness in tibialis anterior 4+ to 5. Tone and bulk are intact in lower extremities. Deep tendon reflexes are 1+ in patellae and absent in the Achilles bilaterally. Gait is moderately antalgic on the left.  Skin: Skin is warm and dry.  Psychiatric: She has a normal mood and affect. Her behavior is normal. Judgment and thought content normal.     Assessment/Plan Spondylolisthesis L4-5 L5-S1 with L5 radiculopathy, idiopathic scoliosis,  Decompression and fusion L4 to sacrum.  Earleen Newport, MD 11/10/2016, 7:32 AM

## 2016-11-10 NOTE — Progress Notes (Signed)
Patient ID: Cheryl Rowe, female   DOB: 06/12/1961, 56 y.o.   MRN: [id number] Signs are stable Motor function appears good Incision is clean and dry We'll mobilize slowly Stable

## 2016-11-10 NOTE — Op Note (Signed)
Date of surgery: 11/10/2016 Preoperative diagnosis: Lumbar spondylolisthesis L5-S1, congenital scoliosis, lumbar radiculopathy, lumbar stenosis. Postoperative diagnosis same, Procedure: Laminectomy and decompression L4-5 and L5-S1 with posterior lumbar interbody arthrodesis, decompression of L4-L5 and S1 nerve roots with more work than require for simple interbody technique. Posterior lumbar interbody arthrodesis L4-5 and L5-S1 with local autograft and allograft peek spacers, segmental fixation L4-S1 with posterior lateral arthrodesis using local autograft and allograft. Surgeon: Kristeen Miss First assistant: Erline Levine M.D. General endotracheal: Anesthesia Indications: The patient is a 56 year old individual's had significant back and bilateral lower extremity pain. She is had a congenital scoliosis since childhood and this is gotten somewhat worse with lateral listhesis of L4 and L5 and a grade 1 lysis at the L5-S1 level. Is advised regarding the need for surgical decompression and stabilization. She is failed all manner of conservative management including numerous injections physical therapy.  Procedure: The patient was brought to the operating supine on a stretcher. After the smooth induction of general endotracheal anesthesia, she was turned prone. The back was prepped with alcohol and DuraPrep and draped in a sterile fashion. Midline incision was created and carried down to the lumbar dorsal fascia. The lower spinous processes were identified and the dissection was taken subperiosteally to expose over the transverse processes of L4-5 and L5-S1. Then large laminotomies were created to remove the inferior marginal lamina of L4 out to and including the entirety of the facet joint at L4-5. Common dural tube was exposed on either side. The L4 nerve root was exposed superiorly this was decompressed using a 2 and 3 mm Kerrison punch. The L4 nerve root was protected in its lateral decompression. L5 was  then removed with relative ease as this was a list static segment and this identified the path the L5 nerve root and the S1 nerve roots. These were each decompressed carefully using a 2 and 3 mm Kerrison punch and a high-speed drill. Once the decompression was completed common dural tube was then carefully mobilized and the disc spaces were isolated. Total discectomies were then performed at L4-5 and L5-S1 using a series of curettes and rongeurs to remove the entirety of the disc area the endplates were rongeured smooth using a combination of curettes and disc shavers. All the available endplate material was removed. Interspace was then sized and was felt at L4-5 that a 9 mm tall 8 lordotic 23 mm spacer would fit best. This was fit with a combination of autograft and infuse. A strip of infuse was then placed into the base of the disc space and 9 mL of bone graft was placed into the interspace along with the 2 spacers. Attention was then turned to L5-S1 worse similar decompression was performed. Here an 8 mm tall 8 lordotic spacer when he 3 mm in length was used this was filled similarly with proximally 9 mm of bone graft. Then the lateral gutters were well decompressed and decorticated particularly down to the region of the ala of the sacrum. This was packed with the remainder of autograft allograft and to strips of infuse. Pedicle entry sites were then chosen at L4-L5 and the sacrum. 6.5 x 50 mm screws were used in L4 6.5 x 50 mm screws were used and L5 and on the left side 6.5 x 55 mm screw was used in the sacrum and 6.5 x 50 mm screw was used on the right side in the sacrum. The rod fitting was then continued by creating a bit of the rotation so  as to improve the patient's overall alignment. This was done carefully and minimally. The rod screw construct was then torqued into position final radiographs were obtained identifying good alignment of the lumbar vertebrae and the patient's back overall. Care was taken  to make sure that the L4 the L5 and S1 nerve roots were well decompressed when this was verified hemostasis was achieved the lumbar dorsal fascia was closed with #1 Vicryl in interrupted fashion 2-0 Vicryl was used in the subcutaneous anus tissues and 3-0 Vicryl was used to close subcuticular skin. Dermabond was placed on the skin.  Blood loss was estimated 500 mL, 120 mL of Cell Saver blood was returned to the patient.

## 2016-11-10 NOTE — Transfer of Care (Signed)
Immediate Anesthesia Transfer of Care Note  Patient: Cheryl Rowe  Procedure(s) Performed: Procedure(s): Lumbar four-five, Lumbar five-Sacral one Posterior lumbar interbody fusion (N/A)  Patient Location: PACU  Anesthesia Type:General  Level of Consciousness: sedated and patient cooperative  Airway & Oxygen Therapy: Patient Spontanous Breathing and Patient connected to face mask oxygen  Post-op Assessment: Report given to RN and Post -op Vital signs reviewed and stable  Post vital signs: Reviewed and stable  Last Vitals:  Vitals:   11/10/16 0545  BP: 126/69  Pulse: 76  Resp: 18  Temp: 36.6 C    Last Pain:  Vitals:   11/10/16 0638  TempSrc:   PainSc: 6          Complications: No apparent anesthesia complications

## 2016-11-11 ENCOUNTER — Encounter (HOSPITAL_COMMUNITY): Payer: Self-pay | Admitting: *Deleted

## 2016-11-11 LAB — BASIC METABOLIC PANEL
Anion gap: 4 — ABNORMAL LOW (ref 5–15)
BUN: 9 mg/dL (ref 6–20)
CO2: 29 mmol/L (ref 22–32)
Calcium: 9.2 mg/dL (ref 8.9–10.3)
Chloride: 106 mmol/L (ref 101–111)
Creatinine, Ser: 0.69 mg/dL (ref 0.44–1.00)
GFR calc Af Amer: >60 mL/min (ref >60)
GFR calc non Af Amer: >60 mL/min (ref >60)
Glucose, Bld: 139 mg/dL — ABNORMAL HIGH (ref 65–99)
Potassium: 4.4 mmol/L (ref 3.5–5.1)
Sodium: 139 mmol/L (ref 135–145)

## 2016-11-11 LAB — CBC
HCT: 31.6 % — ABNORMAL LOW (ref 36.0–46.0)
Hemoglobin: 10.1 g/dL — ABNORMAL LOW (ref 12.0–15.0)
MCH: 29.5 pg (ref 26.0–34.0)
MCHC: 32 g/dL (ref 30.0–36.0)
MCV: 92.4 fL (ref 78.0–100.0)
Platelets: 237 10*3/uL (ref 150–400)
RBC: 3.42 MIL/uL — ABNORMAL LOW (ref 3.87–5.11)
RDW: 13.7 % (ref 11.5–15.5)
WBC: 9.6 10*3/uL (ref 4.0–10.5)

## 2016-11-11 MED FILL — Thrombin For Soln 5000 Unit: CUTANEOUS | Qty: 5000 | Status: AC

## 2016-11-11 NOTE — Progress Notes (Signed)
Patient ID: Cheryl Rowe, female   DOB: [birth year], 56 y.o.   MRN: [id number] Vital signs are stable Motor function appears intact Patient is ambulating well We'll need further help with physical therapy for now Pain under reasonable control

## 2016-11-11 NOTE — Evaluation (Signed)
Occupational Therapy Evaluation Patient Details Name: Cheryl Rowe MRN: [id number] DOB: 11/07/1960 Today's Date: 11/11/2016    History of Present Illness 56 yo female s/p L5-s1 with brace   Past Medical History:  Diagnosis Date  . ADHD (attention deficit hyperactivity disorder)   . Depression   . Family history of adverse reaction to anesthesia    sister has PONV  . GERD (gastroesophageal reflux disease)   . Hyperlipidemia   . Hypertension   . Migraine   . Scoliosis    Teenage  . Spondylolisthesis of lumbosacral region   . Wears glasses       Clinical Impression   Patient evaluated by Occupational Therapy with no further acute OT needs identified. All education has been completed and the patient has no further questions. See below for any follow-up Occupational Therapy or equipment needs. OT to sign off. Thank you for referral.  Pt will stay with parents initially and have spouse (A) in the PM    Follow Up Recommendations  No OT follow up    Equipment Recommendations  None recommended by OT    Recommendations for Other Services       Precautions / Restrictions Precautions Precautions: Back Precaution Comments: handout provided and reviewed in detail Restrictions Weight Bearing Restrictions: No      Mobility Bed Mobility Overal bed mobility: Independent             General bed mobility comments: (A) with position of ice pack  Transfers Overall transfer level: Modified independent                    Balance                                            ADL Overall ADL's : Modified independent                                       General ADL Comments: pt able to cross bil LE. pt required encouragement to eat this session. pt explained dieting prior to surgery and doesnt eat til later afternoon. pt encouraged to do small meal every few hours with pain medication   Back handout provided and reviewed adls in  detail. Pt educated on: clothing between brace, never sleep in brace, set an alarm at night for medication, avoid sitting for long periods of time, correct bed positioning for sleeping, correct sequence for bed mobility, avoiding lifting more than 5 pounds and never wash directly over incision. All education is complete and patient indicates understanding.    Vision Baseline Vision/History: Wears glasses Wears Glasses: At all times       Perception     Praxis      Pertinent Vitals/Pain Pain Assessment: 0-10 Pain Score: 6  Pain Descriptors / Indicators: Operative site guarding Pain Intervention(s): Monitored during session;Premedicated before session;Repositioned     Hand Dominance Right   Extremity/Trunk Assessment Upper Extremity Assessment Upper Extremity Assessment: Overall WFL for tasks assessed   Lower Extremity Assessment Lower Extremity Assessment: Defer to PT evaluation   Cervical / Trunk Assessment Cervical / Trunk Assessment: Other exceptions (s/p surg)   Communication Communication Communication: No difficulties   Cognition Arousal/Alertness: Awake/alert Behavior During Therapy: WFL for tasks assessed/performed Overall Cognitive Status: Within Functional Limits  for tasks assessed                     General Comments  incision dry and intact    Exercises       Shoulder Instructions      Home Living Family/patient expects to be discharged to:: Private residence Living Arrangements: Spouse/significant other Available Help at Discharge: Family Type of Home: House Home Access: Stairs to enter Technical brewer of Steps: 1 Entrance Stairs-Rails: None Home Layout: One level     Bathroom Shower/Tub: Teacher, early years/pre: Standard     Home Equipment: None          Prior Functioning/Environment Level of Independence: Independent                 OT Problem List:        OT Treatment/Interventions:      OT  Goals(Current goals can be found in the care plan section) Acute Rehab OT Goals Potential to Achieve Goals: Good  OT Frequency:     Barriers to D/C:            Co-evaluation              End of Session Nurse Communication: Mobility status;Precautions  Activity Tolerance: Patient tolerated treatment well Patient left: in bed;with call bell/phone within reach  OT Visit Diagnosis: Unsteadiness on feet (R26.81)                ADL either performed or assessed with clinical judgement  Time: 0375-4360 OT Time Calculation (min): 11 min Charges:  OT General Charges $OT Visit: 1 Procedure OT Evaluation $OT Eval Moderate Complexity: 1 Procedure G-Codes:      Jeri Modena   OTR/L Pager: [phone] Office: [phone] .   Parke Poisson B 11/11/2016, 9:52 AM

## 2016-11-11 NOTE — Evaluation (Signed)
Physical Therapy Evaluation Patient Details Name: Cheryl Rowe MRN: [id number] DOB: [birth year] Today's Date: 11/11/2016   History of Present Illness  56 yo female s/p L5-S1 with brace   Clinical Impression  Patient evaluated by Physical Therapy with no further acute PT needs identified. All education has been completed and the patient has no further questions. At the time of PT eval pt was able to perform transfers and ambulation with modified independence to independence. She was able to complete stair training at a supervision level. See below for any follow-up Physical Therapy or equipment needs. PT is signing off. Thank you for this referral.     Follow Up Recommendations Outpatient PT;Supervision - Intermittent    Equipment Recommendations  None recommended by PT    Recommendations for Other Services       Precautions / Restrictions Precautions Precautions: Back Precaution Comments: handout provided and reviewed in detail Restrictions Weight Bearing Restrictions: No      Mobility  Bed Mobility Overal bed mobility: Independent             General bed mobility comments: VC's for log roll with good demonstration  Transfers Overall transfer level: Modified independent Equipment used: None             General transfer comment: No assist required.  Ambulation/Gait Ambulation/Gait assistance: Modified independent (Device/Increase time) Ambulation Distance (Feet): 400 Feet Assistive device: None Gait Pattern/deviations: Step-through pattern;Decreased stride length;Trunk flexed Gait velocity: Decreased Gait velocity interpretation: Below normal speed for age/gender General Gait Details: Pt was able to ambulate well with no assistance. Slow but steady.   Stairs Stairs: Yes Stairs assistance: Supervision Stair Management: One rail Right;Step to pattern;Forwards Number of Stairs: 10 General stair comments: VC's for sequencing and general safety. No assist  required.  Wheelchair Mobility    Modified Rankin (Stroke Patients Only)       Balance Overall balance assessment: Needs assistance Sitting-balance support: Feet supported;No upper extremity supported Sitting balance-Leahy Scale: Good     Standing balance support: No upper extremity supported;During functional activity Standing balance-Leahy Scale: Fair                               Pertinent Vitals/Pain Pain Assessment: Faces Faces Pain Scale: Hurts even more Pain Location: Incision site Pain Descriptors / Indicators: Operative site guarding Pain Intervention(s): Monitored during session    Home Living Family/patient expects to be discharged to:: Private residence Living Arrangements: Spouse/significant other Available Help at Discharge: Family Type of Home: House Home Access: Stairs to enter Entrance Stairs-Rails: None Technical brewer of Steps: 1 Home Layout: One level Home Equipment: None Additional Comments: Going to her parents house initially and then home with husband after a couple days where she will have to negotiate a flight of stairs    Prior Function Level of Independence: Independent               Hand Dominance   Dominant Hand: Right    Extremity/Trunk Assessment   Upper Extremity Assessment Upper Extremity Assessment: Overall WFL for tasks assessed    Lower Extremity Assessment Lower Extremity Assessment: Generalized weakness (Consistent with pre-op diagnosis)    Cervical / Trunk Assessment Cervical / Trunk Assessment: Other exceptions (s/p surg)  Communication   Communication: No difficulties  Cognition Arousal/Alertness: Awake/alert Behavior During Therapy: WFL for tasks assessed/performed Overall Cognitive Status: Within Functional Limits for tasks assessed  General Comments      Exercises     Assessment/Plan    PT Assessment Patent does not need any further PT services   PT Problem List         PT Treatment Interventions      PT Goals (Current goals can be found in the Care Plan section)  Acute Rehab PT Goals PT Goal Formulation: All assessment and education complete, DC therapy    Frequency     Barriers to discharge        Co-evaluation               End of Session Equipment Utilized During Treatment: Gait belt;Back brace Activity Tolerance: Patient tolerated treatment well Patient left: in chair;with call bell/phone within reach Nurse Communication: Mobility status PT Visit Diagnosis: Unsteadiness on feet (R26.81);Pain         Time: 0805-0825 PT Time Calculation (min) (ACUTE ONLY): 20 min   Charges:   PT Evaluation $PT Eval Moderate Complexity: 1 Procedure     PT G Codes:         Thelma Comp 11/11/2016, 1:35 PM   Rolinda Roan, PT, DPT Acute Rehabilitation Services Pager: [phone]

## 2016-11-12 ENCOUNTER — Encounter (HOSPITAL_COMMUNITY): Payer: Self-pay | Admitting: *Deleted

## 2016-11-12 MED ORDER — DEXAMETHASONE 1 MG PO TABS: ORAL_TABLET | ORAL | 0 refills | Status: DC

## 2016-11-12 MED ORDER — DIAZEPAM 5 MG PO TABS: 5.0000 mg | ORAL_TABLET | Freq: Four times a day (QID) | ORAL | 0 refills | Status: DC | PRN

## 2016-11-12 MED ORDER — OXYCODONE-ACETAMINOPHEN 5-325 MG PO TABS: 1.0000 | ORAL_TABLET | ORAL | 0 refills | Status: DC | PRN

## 2016-11-12 NOTE — Discharge Summary (Signed)
Physician Discharge Summary  Patient ID: Cheryl Rowe MRN: [id number] DOB/AGE: [birth year] 55 y.o.  Admit date: 11/10/2016 Discharge date: 11/12/2016  Admission Diagnoses:Idiopathic scoliosis, spondylolisthesis L4-5 and L5-S1 with lumbar radiculopathy  Discharge Diagnoses: Idiopathic scoliosis. Spondylolisthesis L4-5 and L5-S1 with lumbar radiculopathy. Active Problems:   Spondylolisthesis at L5-S1 level   Discharged Condition: good  Hospital Course: Cheryl Rowe is admitted to undergo surgical decompression tolerated surgery well.  Consults: None  Significant Diagnostic Studies: None  Treatments: surgery: Laminectomy decompression L4-5 and L5-S1 posterior lumbar interbody arthrodesis with peek spacers local autograft and allograft pedicle screw fixation L4 to sacrum.  Discharge Exam: Blood pressure 119/89, pulse 84, temperature 98.4 F (36.9 C), resp. rate 18, height 5\' 5"  (1.651 m), weight 81 kg (178 lb 9.2 oz), SpO2 98 %. Incision is clean and dry motor function is intact station and gait are intact.  Disposition: 01-Home or Self Care  Discharge Instructions    Call MD for:  redness, tenderness, or signs of infection (pain, swelling, redness, odor or green/yellow discharge around incision site)    Complete by:  As directed    Call MD for:  severe uncontrolled pain    Complete by:  As directed    Call MD for:  temperature >100.4    Complete by:  As directed    Diet - low sodium heart healthy    Complete by:  As directed    Discharge instructions    Complete by:  As directed    Okay to shower. Do not apply salves or appointments to incision. No heavy lifting with the upper extremities greater than 15 pounds. May resume driving when not requiring pain medication and patient feels comfortable with doing so.   Incentive spirometry RT    Complete by:  As directed    Increase activity slowly    Complete by:  As directed      Allergies as of 11/12/2016      Reactions   Pravastatin     Legs ache   Cymbalta [duloxetine Hcl] Other (See Comments)   Aggressive      Medication List    TAKE these medications   amoxicillin-clavulanate 875-125 MG tablet Commonly known as:  AUGMENTIN Take 1 tablet by mouth 2 (two) times daily. X 10 days   aspirin-acetaminophen-caffeine 867-672-09 MG tablet Commonly known as:  EXCEDRIN MIGRAINE Take 2 tablets by mouth every 6 (six) hours as needed for headache.   BENEFIBER Powd Take 1 scoop by mouth at bedtime.   CAL-MAG-ZINC PO Take 1 tablet by mouth 3 (three) times daily.   clonazePAM 0.5 MG tablet Commonly known as:  KLONOPIN Take 1 tablet (0.5 mg total) by mouth 2 (two) times daily as needed. for anxiety What changed:  when to take this  additional instructions   dexamethasone 1 MG tablet Commonly known as:  DECADRON 2 tablets twice daily for 2 days, one tablet twice daily for 2 days, one tablet daily for 2 days.   diazepam 5 MG tablet Commonly known as:  VALIUM Take 1 tablet (5 mg total) by mouth every 6 (six) hours as needed for muscle spasms.   lidocaine 5 % Commonly known as:  LIDODERM PLACE ONE PATCH ONTO THE SKIN DAILY-REMOVE AND DISCARD PATCH WITHIN 12 HOURS AS DIRECTED BY MD What changed:  See the new instructions.   lisinopril 5 MG tablet Commonly known as:  PRINIVIL,ZESTRIL Take 1 tablet (5 mg total) by mouth daily. What changed:  how much to take  multivitamin with minerals Tabs tablet Take 1 tablet by mouth daily.   omeprazole 40 MG capsule Commonly known as:  PRILOSEC Take 1 capsule (40 mg total) by mouth daily.   oxyCODONE-acetaminophen 10-325 MG tablet Commonly known as:  PERCOCET One q4 hours prn pain max 6 a day What changed:  how much to take  how to take this  when to take this  reasons to take this  additional instructions   oxyCODONE-acetaminophen 5-325 MG tablet Commonly known as:  PERCOCET/ROXICET Take 1-2 tablets by mouth every 4 (four) hours as needed for severe  pain. What changed:  You were already taking a medication with the same name, and this prescription was added. Make sure you understand how and when to take each.   traZODone 100 MG tablet Commonly known as:  DESYREL Take 1 tablet (100 mg total) by mouth at bedtime.   Venlafaxine HCl 75 MG Tb24 TAKE THREE TABLETS BY MOUTH AT NIGHT What changed:  how much to take  how to take this  when to take this  additional instructions        Signed: Earleen Newport 11/12/2016, 9:34 AM

## 2016-11-12 NOTE — Progress Notes (Signed)
Patient is discharged from room 3C05 at this time. Alert and in stable condition. IV site d/c'd and instructions read to patient with understanding verbalized. Left unit via wheelchair with parents and all belongings at side.

## 2016-12-01 ENCOUNTER — Encounter: Payer: Self-pay | Admitting: Family Medicine

## 2016-12-24 ENCOUNTER — Encounter: Payer: Self-pay | Admitting: Family Medicine

## 2016-12-24 ENCOUNTER — Ambulatory Visit (INDEPENDENT_AMBULATORY_CARE_PROVIDER_SITE_OTHER): Payer: BLUE CROSS/BLUE SHIELD | Admitting: Family Medicine

## 2016-12-24 VITALS — BP 110/70 | Ht 66.0 in | Wt 168.2 lb

## 2016-12-24 DIAGNOSIS — G8929 Other chronic pain: Secondary | ICD-10-CM

## 2016-12-24 DIAGNOSIS — M5440 Lumbago with sciatica, unspecified side: Secondary | ICD-10-CM

## 2016-12-24 DIAGNOSIS — R432 Parageusia: Secondary | ICD-10-CM | POA: Diagnosis not present

## 2016-12-24 DIAGNOSIS — F324 Major depressive disorder, single episode, in partial remission: Secondary | ICD-10-CM | POA: Diagnosis not present

## 2016-12-24 MED ORDER — OXYCODONE-ACETAMINOPHEN 10-325 MG PO TABS: 1.0000 | ORAL_TABLET | ORAL | 0 refills | Status: DC | PRN

## 2016-12-24 MED ORDER — AMLODIPINE BESYLATE 2.5 MG PO TABS: 2.5000 mg | ORAL_TABLET | Freq: Every day | ORAL | 5 refills | Status: DC

## 2016-12-24 NOTE — Progress Notes (Signed)
   Subjective:    Patient ID: [name], female    DOB: [birth year], 56 y.o.   MRN: [id number]  HPI This patient was seen today for chronic pain.  The medication list was reviewed and updated.   -Compliance with medication: yes  - Number patient states they take daily: 6 daily   -when was the last dose patient took: Today   The patient was advised the importance of maintaining medication and not using illegal substances with these.  Refills needed: yes  The patient was educated that we can provide 3 monthly scripts for their medication, it is their responsibility to follow the instructions.  Side effects or complications from medications: none  Patient is aware that pain medications are meant to minimize the severity of the pain to allow their pain levels to improve to allow for better function. They are aware of that pain medications cannot totally remove their pain.  Due for UDT ( at least once per year) : utd  Patient states that since she started the lisinopril, she can't taste her food.    She states she is taken her pain medicine as directed she denies any problems with it. Does not cause drowsiness. She does use nerve medicine intermittently. No headaches nausea or vomiting. She does relate a lot of back pain radiates into the hips and the lower back. The back specialist told her there is nothing they can do about this.   Review of Systems  Constitutional: Negative for activity change and appetite change.  Gastrointestinal: Negative for abdominal pain and vomiting.  Musculoskeletal: Positive for arthralgias and back pain.  Neurological: Negative for weakness.  Psychiatric/Behavioral: Negative for confusion.       Objective:   Physical Exam  Constitutional: She appears well-nourished. No distress.  HENT:  Head: Normocephalic.  Cardiovascular: Normal rate, regular rhythm and normal heart sounds.   No murmur heard. Pulmonary/Chest: Effort normal and breath  sounds normal.  Musculoskeletal: She exhibits no edema.  Lymphadenopathy:    She has no cervical adenopathy.  Neurological: She is alert.  Psychiatric: Her behavior is normal.  Vitals reviewed.         Assessment & Plan:  It is possible that her lack of sense of taste could be related to lisinopril. Stop lisinopril start amlodipine 2.5 mg daily  Chronic pain 3 prescriptions written patient stable on current dosing patient was warned for her clonazepam to use clonazepam during the day not as a sleeping pill  Patient suffers with severe anxiety issues has been on clonazepam for long-standing time this is at the lowest possible dose.  Depression stable currently  Follow-up 3 months she will be seen Dr. Ellene Route she states she may have to get additional surgeries

## 2017-01-07 ENCOUNTER — Other Ambulatory Visit: Payer: Self-pay | Admitting: Family Medicine

## 2017-01-26 ENCOUNTER — Other Ambulatory Visit: Payer: Self-pay | Admitting: *Deleted

## 2017-01-26 ENCOUNTER — Telehealth: Payer: Self-pay | Admitting: Family Medicine

## 2017-01-26 MED ORDER — LISINOPRIL 2.5 MG PO TABS: 2.5000 mg | ORAL_TABLET | Freq: Every day | ORAL | 5 refills | Status: DC

## 2017-01-26 NOTE — Telephone Encounter (Signed)
Pt called stating that the amlopdipine is making her tired. Pt states that she wants to go back on the lisinopril. Please advise.

## 2017-01-26 NOTE — Telephone Encounter (Signed)
On a previous phone message when the patient was on 5 mg of lisinopril she had some blood pressure readings that were on the low when therefore I would recommend starting off at 2.5 and if her blood pressures don't look good over the course of next few weeks she needs to notify us and we would go back to 5 mg

## 2017-01-26 NOTE — Telephone Encounter (Signed)
Discussed with pt. Pt verbalized understanding. Med sent to pharm. Amlodipine canceled.

## 2017-01-26 NOTE — Telephone Encounter (Signed)
Stop amlodipine because of fatigue issues, start lisinopril 2.5 mg one every morning, #30, 5 refills

## 2017-01-26 NOTE — Telephone Encounter (Signed)
Discussed with pt. Pt states she was on lisinopril 5mg . She wanted to clarify the dose. She is ok if you wanted her to go to a lower dose.

## 2017-03-23 ENCOUNTER — Ambulatory Visit (INDEPENDENT_AMBULATORY_CARE_PROVIDER_SITE_OTHER): Payer: BLUE CROSS/BLUE SHIELD | Admitting: Family Medicine

## 2017-03-23 ENCOUNTER — Ambulatory Visit (HOSPITAL_COMMUNITY)
Admission: RE | Admit: 2017-03-23 | Discharge: 2017-03-23 | Disposition: A | Discharge status: Home / Self Care | Payer: BLUE CROSS/BLUE SHIELD | Source: Ambulatory Visit | Attending: Family Medicine | Admitting: Family Medicine

## 2017-03-23 ENCOUNTER — Encounter: Payer: Self-pay | Admitting: Family Medicine

## 2017-03-23 VITALS — BP 128/90 | Ht 66.0 in | Wt 163.0 lb

## 2017-03-23 DIAGNOSIS — M5440 Lumbago with sciatica, unspecified side: Secondary | ICD-10-CM | POA: Diagnosis not present

## 2017-03-23 DIAGNOSIS — Z9889 Other specified postprocedural states: Secondary | ICD-10-CM | POA: Diagnosis not present

## 2017-03-23 DIAGNOSIS — M25551 Pain in right hip: Secondary | ICD-10-CM | POA: Insufficient documentation

## 2017-03-23 DIAGNOSIS — E784 Other hyperlipidemia: Secondary | ICD-10-CM | POA: Diagnosis not present

## 2017-03-23 DIAGNOSIS — G8929 Other chronic pain: Secondary | ICD-10-CM

## 2017-03-23 DIAGNOSIS — Z79899 Other long term (current) drug therapy: Secondary | ICD-10-CM

## 2017-03-23 DIAGNOSIS — D509 Iron deficiency anemia, unspecified: Secondary | ICD-10-CM | POA: Diagnosis not present

## 2017-03-23 DIAGNOSIS — E7849 Other hyperlipidemia: Secondary | ICD-10-CM

## 2017-03-23 MED ORDER — OXYCODONE-ACETAMINOPHEN 10-325 MG PO TABS: 1.0000 | ORAL_TABLET | ORAL | 0 refills | Status: DC | PRN

## 2017-03-23 NOTE — Progress Notes (Signed)
   Subjective:    Patient ID: Cheryl Rowe, female    DOB: December 25, 1960, 56 y.o.   MRN: [id number]  HPI This patient was seen today for chronic pain  The medication list was reviewed and updated.   -Compliance with medication: Yes, takes as prescribed  - Number patient states they take daily: 6 daily  -when was the last dose patient took? One this morning  The patient was advised the importance of maintaining medication and not using illegal substances with these.  Refills needed: Yes  The patient was educated that we can provide 3 monthly scripts for their medication, it is their responsibility to follow the instructions.  Side effects or complications from medications: None  Patient is aware that pain medications are meant to minimize the severity of the pain to allow their pain levels to improve to allow for better function. They are aware of that pain medications cannot totally remove their pain.  Due for UDT ( at least once per year) : 07/2017        Review of Systems  Constitutional: Negative for activity change and appetite change.  Gastrointestinal: Negative for abdominal pain and vomiting.  Musculoskeletal: Positive for arthralgias and back pain.  Neurological: Negative for weakness.  Psychiatric/Behavioral: Negative for confusion.       Objective:   Physical Exam  Constitutional: She appears well-nourished. No distress.  HENT:  Head: Normocephalic.  Cardiovascular: Normal rate, regular rhythm and normal heart sounds.   No murmur heard. Pulmonary/Chest: Effort normal and breath sounds normal.  Musculoskeletal: She exhibits no edema.  Lymphadenopathy:    She has no cervical adenopathy.  Neurological: She is alert.  Psychiatric: Her behavior is normal.  Vitals reviewed.         Assessment & Plan:  Severe right hip pain I believe this is more likely musculoskeletal pain from her back we will do a hip x-ray patient may well need physical therapy and I  hope she doesn't have to have more surgery  Pain medicine she denies abusing it she is taking up to 6 a day her husband monitors her medicine prescription was given to the patient we will see the patient back in 4 weeks hopefully be able to reduce the pain medicine at that time  She does have history of some anemia fatigue tiredness and hyperlipidemia we will go ahead and do some lab work

## 2017-03-24 ENCOUNTER — Telehealth: Payer: Self-pay | Admitting: Family Medicine

## 2017-03-24 LAB — CBC WITH DIFFERENTIAL/PLATELET
Basophils Absolute: 0.1 10*3/uL (ref 0.0–0.2)
Basos: 1 %
EOS (ABSOLUTE): 0.2 10*3/uL (ref 0.0–0.4)
Eos: 3 %
Hematocrit: 41.7 % (ref 34.0–46.6)
Hemoglobin: 13.3 g/dL (ref 11.1–15.9)
Immature Grans (Abs): 0 10*3/uL (ref 0.0–0.1)
Immature Granulocytes: 0 %
Lymphocytes Absolute: 2.6 10*3/uL (ref 0.7–3.1)
Lymphs: 42 %
MCH: 28.1 pg (ref 26.6–33.0)
MCHC: 31.9 g/dL (ref 31.5–35.7)
MCV: 88 fL (ref 79–97)
Monocytes Absolute: 0.4 10*3/uL (ref 0.1–0.9)
Monocytes: 7 %
Neutrophils Absolute: 3 10*3/uL (ref 1.4–7.0)
Neutrophils: 47 %
Platelets: 319 10*3/uL (ref 150–379)
RBC: 4.74 x10E6/uL (ref 3.77–5.28)
RDW: 15 % (ref 12.3–15.4)
WBC: 6.2 10*3/uL (ref 3.4–10.8)

## 2017-03-24 LAB — FERRITIN: Ferritin: 52 ng/mL (ref 15–150)

## 2017-03-24 LAB — LIPID PANEL
Chol/HDL Ratio: 3.8 ratio (ref 0.0–4.4)
Cholesterol, Total: 198 mg/dL (ref 100–199)
HDL: 52 mg/dL (ref >39)
LDL Calculated: 121 mg/dL — ABNORMAL HIGH (ref 0–99)
Triglycerides: 123 mg/dL (ref 0–149)
VLDL Cholesterol Cal: 25 mg/dL (ref 5–40)

## 2017-03-24 NOTE — Telephone Encounter (Signed)
Spoke with patient and informed her per Dr.Scott Luking- Noted, let us know if you get worse. Patient verbalized understanding.

## 2017-03-24 NOTE — Telephone Encounter (Signed)
So noted-if it gets worse please notify us

## 2017-03-24 NOTE — Telephone Encounter (Signed)
Pt called stating that she wants to hold off on the physical therapy for one month. Pt is wanting to try it on her own at this time.

## 2017-04-07 ENCOUNTER — Other Ambulatory Visit: Payer: Self-pay | Admitting: Family Medicine

## 2017-04-15 ENCOUNTER — Other Ambulatory Visit: Payer: Self-pay | Admitting: Family Medicine

## 2017-04-20 ENCOUNTER — Other Ambulatory Visit: Payer: Self-pay | Admitting: *Deleted

## 2017-04-20 ENCOUNTER — Telehealth: Payer: Self-pay | Admitting: Family Medicine

## 2017-04-20 MED ORDER — CLONAZEPAM 0.5 MG PO TABS: 0.5000 mg | ORAL_TABLET | Freq: Two times a day (BID) | ORAL | 0 refills | Status: DC | PRN

## 2017-04-20 NOTE — Telephone Encounter (Signed)
1 refill 

## 2017-04-20 NOTE — Telephone Encounter (Signed)
Pt is needing refills on clonazePAM (KLONOPIN) 0.5 MG tablet   Pharmacy sent over a request on 04/15/2017 as well.     [place]

## 2017-04-20 NOTE — Telephone Encounter (Signed)
rx faxed to pharm. Pt notified.  

## 2017-04-22 ENCOUNTER — Ambulatory Visit (INDEPENDENT_AMBULATORY_CARE_PROVIDER_SITE_OTHER): Payer: BLUE CROSS/BLUE SHIELD | Admitting: Family Medicine

## 2017-04-22 ENCOUNTER — Encounter: Payer: Self-pay | Admitting: Family Medicine

## 2017-04-22 VITALS — BP 138/88 | Ht 66.0 in | Wt 164.0 lb

## 2017-04-22 DIAGNOSIS — Z79891 Long term (current) use of opiate analgesic: Secondary | ICD-10-CM

## 2017-04-22 MED ORDER — OXYCODONE-ACETAMINOPHEN 10-325 MG PO TABS: 1.0000 | ORAL_TABLET | ORAL | 0 refills | Status: DC | PRN

## 2017-04-22 MED ORDER — CLONAZEPAM 0.5 MG PO TABS: 0.5000 mg | ORAL_TABLET | Freq: Two times a day (BID) | ORAL | 1 refills | Status: DC | PRN

## 2017-04-22 NOTE — Progress Notes (Signed)
Subjective:    Patient ID: Cheryl Rowe, female    DOB: 01/26/1961, 56 y.o.   MRN: [id number]  HPI  This patient was seen today for chronic pain  The medication list was reviewed and updated.   -Compliance with medication: Yes  - Number patient states they take daily: five daily  -when was the last dose patient took? This am around 8 am.  The patient was advised the importance of maintaining medication and not using illegal substances with these.  Refills needed: Yes  The patient was educated that we can provide 3 monthly scripts for their medication, it is their responsibility to follow the instructions.  Side effects or complications from medications: None  Patient is aware that pain medications are meant to minimize the severity of the pain to allow their pain levels to improve to allow for better function. They are aware of that pain medications cannot totally remove their pain.  Due for UDT ( at least once per year) : Due today 04/22/2017  Pain medicine does allow her to function but she denies abusing it she has intentions of tapering it down in getting off the pain medicine she has been warned not to use pain medicine with a nerve medicine same time   Review of Systems  Constitutional: Negative for activity change and appetite change.  HENT: Negative for congestion.   Respiratory: Negative for cough.   Cardiovascular: Negative for chest pain.  Gastrointestinal: Negative for abdominal pain and vomiting.  Skin: Negative for color change.  Neurological: Negative for weakness.  Psychiatric/Behavioral: Negative for confusion.       Objective:   Physical Exam  Constitutional: She appears well-nourished. No distress.  HENT:  Head: Normocephalic.  Right Ear: External ear normal.  Left Ear: External ear normal.  Eyes: Right eye exhibits no discharge. Left eye exhibits no discharge.  Neck: No tracheal deviation present.  Cardiovascular: Normal rate, regular rhythm  and normal heart sounds.   No murmur heard. Pulmonary/Chest: Effort normal and breath sounds normal. No respiratory distress. She has no wheezes. She has no rales.  Musculoskeletal: She exhibits no edema.  Lymphadenopathy:    She has no cervical adenopathy.  Neurological: She is alert.  Psychiatric: Her behavior is normal.  Vitals reviewed.         Assessment & Plan:  The patient was seen today as part of a comprehensive visit regarding pain control. Patient's compliance with the medication as well as discussion regarding effectiveness was completed. Prescriptions were written. Patient was advised to follow-up in 3 months. The patient was assessed for any signs of severe side effects. The patient was advised to take the medicine as directed and to report to [place] if any side effect issues.  This patient is motivated to get off of her pain medicine she was taking 6 per day she got down to 5 she will start dropping it one half tablet every 1-2 weeks with the intentions of an eventually getting off the medicine she was written 2 prescriptions today 145 per day 1 for 4 per day she will follow-up in early November she will keep [place] updated on her progress and she was warned not to abruptly stop the medicine but just gradually taper down one half tablet per week if she has any trouble she is to let us now  The patient is intending to start [employer] classes in January she thinks she may need medication such as Vyvanse we will discuss that later at  her next visit

## 2017-04-22 NOTE — Patient Instructions (Signed)

## 2017-04-29 LAB — TOXASSURE SELECT 13 (MW), URINE

## 2017-05-04 ENCOUNTER — Encounter: Payer: Self-pay | Admitting: Family Medicine

## 2017-05-19 ENCOUNTER — Telehealth: Payer: Self-pay | Admitting: Family Medicine

## 2017-05-19 DIAGNOSIS — E210 Primary hyperparathyroidism: Secondary | ICD-10-CM

## 2017-05-19 DIAGNOSIS — L659 Nonscarring hair loss, unspecified: Secondary | ICD-10-CM

## 2017-05-19 NOTE — Telephone Encounter (Signed)
Patient said that she has noticed over the last few months that she is losing more hair than normal. She is wanting to know if Dr. Nicki Reaper can order a test to check for vitamin deficiencies?

## 2017-05-20 NOTE — Telephone Encounter (Signed)
Spoke with patient and informed her per Dr.Scott Luking-Dr.Scott Luking is  not familiar with vitamin deficiencies and hair loss? But typically what is tested is TSH, serum iron, ferritin, metabolic 7. Patient verbalized understanding and stated that she would like to have those labs drawn. Orders place for the labs listed above.

## 2017-05-20 NOTE — Telephone Encounter (Signed)
Not familiar with vitamin deficiencies and hair loss? But typically what is tested is TSH, serum iron, ferritin, metabolic 7-(if patient has specific vitamin deficiencies she is worried about let me know)

## 2017-06-04 ENCOUNTER — Ambulatory Visit (INDEPENDENT_AMBULATORY_CARE_PROVIDER_SITE_OTHER): Payer: BLUE CROSS/BLUE SHIELD | Admitting: Family Medicine

## 2017-06-04 ENCOUNTER — Encounter: Payer: Self-pay | Admitting: Family Medicine

## 2017-06-04 ENCOUNTER — Ambulatory Visit: Payer: BLUE CROSS/BLUE SHIELD | Admitting: Family Medicine

## 2017-06-04 VITALS — BP 132/82 | Ht 66.0 in | Wt 162.8 lb

## 2017-06-04 DIAGNOSIS — F988 Other specified behavioral and emotional disorders with onset usually occurring in childhood and adolescence: Secondary | ICD-10-CM | POA: Diagnosis not present

## 2017-06-04 DIAGNOSIS — J069 Acute upper respiratory infection, unspecified: Secondary | ICD-10-CM

## 2017-06-04 MED ORDER — AMPHETAMINE-DEXTROAMPHET ER 20 MG PO CP24: 20.0000 mg | ORAL_CAPSULE | Freq: Every day | ORAL | 0 refills | Status: DC

## 2017-06-04 NOTE — Progress Notes (Signed)
   Subjective:    Patient ID: Cheryl Rowe, female    DOB: 05-Nov-1960, 56 y.o.   MRN: [id number]  HPI Patient would like to discuss starting back on a stimulant for her adult ADHD. Patient feels she is having focusing problems a lot of absentmindedness or is no harmful forgetfulness. No dementia like symptoms. She has a history of ADD. Adderall was helpful for her. There was a time where she took too much but now her husband is involved with her medications  She does have chronic pain and discomfort she is gradually decreasing the amount of pain medicine she is using  She is reducing the amount and nerve medicine she is using as well Review of Systems Also has a mild upper respiratory illness runny nose cough no wheezing or difficulty breathing   difficult time focusing denies chest pain shortness breath fevers chills vomiting diarrhea Objective:   Physical Exam Lungs clear no crackles respiratory rate normal heart is regular HEENT benign       Assessment & Plan:  Adult ADD-Adderall prescribed follow-up in 3-4 weeks check weight blood pressure and effectiveness  Chronic pain gradually reduce pain medicine half tablet per week if possible hopefully will be on less medicine recheck in 3 weeks  Viral URI should gradually get better.

## 2017-06-04 NOTE — Patient Instructions (Signed)
Follow-up in viral URI is worse Recheck 3-4 weeks

## 2017-06-20 ENCOUNTER — Encounter: Payer: Self-pay | Admitting: Family Medicine

## 2017-06-21 NOTE — Telephone Encounter (Signed)
She may have a prescription for Adderall 25 mg XR -#30-it will be necessary to find out when she got the 20 mg filled then she is eligible to go up on the dose when her next prescription is due-she may cancel her follow-up visit but I recommend that she reschedule a visit in approximately 4 weeks to see how the new doses doing then if doing well at that point I can issue 3 prescriptions

## 2017-06-30 ENCOUNTER — Ambulatory Visit (INDEPENDENT_AMBULATORY_CARE_PROVIDER_SITE_OTHER): Payer: BLUE CROSS/BLUE SHIELD | Admitting: Family Medicine

## 2017-06-30 ENCOUNTER — Encounter: Payer: Self-pay | Admitting: Family Medicine

## 2017-06-30 VITALS — BP 128/80 | Ht 66.0 in | Wt 162.2 lb

## 2017-06-30 DIAGNOSIS — F988 Other specified behavioral and emotional disorders with onset usually occurring in childhood and adolescence: Secondary | ICD-10-CM | POA: Diagnosis not present

## 2017-06-30 DIAGNOSIS — M4317 Spondylolisthesis, lumbosacral region: Secondary | ICD-10-CM

## 2017-06-30 MED ORDER — LISINOPRIL 2.5 MG PO TABS: 2.5000 mg | ORAL_TABLET | Freq: Every day | ORAL | 5 refills | Status: DC

## 2017-06-30 MED ORDER — TRAZODONE HCL 100 MG PO TABS
100.0000 mg | ORAL_TABLET | Freq: Every day | ORAL | 3 refills | Status: DC
Start: 2017-06-30 — End: 2018-07-02

## 2017-06-30 MED ORDER — OXYCODONE-ACETAMINOPHEN 10-325 MG PO TABS: 1.0000 | ORAL_TABLET | Freq: Three times a day (TID) | ORAL | 0 refills | Status: DC | PRN

## 2017-06-30 MED ORDER — CLONAZEPAM 0.5 MG PO TABS: 0.5000 mg | ORAL_TABLET | Freq: Two times a day (BID) | ORAL | 4 refills | Status: DC | PRN

## 2017-06-30 MED ORDER — AMPHETAMINE-DEXTROAMPHET ER 25 MG PO CP24: 25.0000 mg | ORAL_CAPSULE | ORAL | 0 refills | Status: DC

## 2017-06-30 MED ORDER — OMEPRAZOLE 40 MG PO CPDR: 40.0000 mg | DELAYED_RELEASE_CAPSULE | Freq: Every day | ORAL | 3 refills | Status: DC

## 2017-06-30 MED ORDER — VENLAFAXINE HCL ER 75 MG PO TB24: ORAL_TABLET | ORAL | 2 refills | Status: DC

## 2017-06-30 NOTE — Progress Notes (Signed)
   Subjective:    Patient ID: Cheryl Rowe, female    DOB: 01-15-1961, 56 y.o.   MRN: [id number]  HPI Patient arrives for a follow up on ADHD. Patient recently in [place] my chart message I respond to it but for some reason it was not forwarded to her from the nurse relates that she was very sad about that thinking that I had ignored her.  I told documentation and I apologized to her  She does state that the newer dose seems to be doing fair but she feels like she could use a stronger dose Has been cutting back on her pain medicine She is trying to stay active Denies being depressed currently and states her medications overall are doing well for that Her pharmacy in [place] gave her Narcan prescription per their policy the patient was within that by this but we talked about it and she has decided to keep the Narcan nasal spray  Review of Systems  Constitutional: Negative for activity change and appetite change.  HENT: Negative for congestion.   Respiratory: Negative for cough.   Cardiovascular: Negative for chest pain.  Gastrointestinal: Negative for abdominal pain and vomiting.  Skin: Negative for color change.  Neurological: Negative for weakness.  Psychiatric/Behavioral: Negative for confusion.       Objective:   Physical Exam  Constitutional: She appears well-nourished. No distress.  HENT:  Head: Normocephalic.  Right Ear: External ear normal.  Left Ear: External ear normal.  Eyes: Right eye exhibits no discharge. Left eye exhibits no discharge.  Neck: No tracheal deviation present.  Cardiovascular: Normal rate, regular rhythm and normal heart sounds.   No murmur heard. Pulmonary/Chest: Effort normal and breath sounds normal. No respiratory distress. She has no wheezes. She has no rales.  Musculoskeletal: She exhibits no edema.  Lymphadenopathy:    She has no cervical adenopathy.  Neurological: She is alert.  Psychiatric: Her behavior is normal.  Vitals  reviewed.         Assessment & Plan:  ADHD-increase the dose 25 mg monitor closely recheck within 4 weeks patient states it does help with the 20 mg did not help enough her husband gives her medicine to her so therefore she does not overuse her medicine  She was encouraged to taper down on nerve medication  Narcan spray was given to her by [employer] I told her it would be wise for her to keep this just in case there was ever any accidental overdose  Chronic pain with her back she is actually doing better with this she is down to 3-1/2 tablets a day I recommend next step is go to 3 tablets prescription was written she will follow-up in 4 weeks may be at that time can go down to 2-1/2 tablets daily  Recheck patient in 4 weeks

## 2017-07-08 ENCOUNTER — Encounter: Payer: Self-pay | Admitting: Family Medicine

## 2017-07-12 ENCOUNTER — Ambulatory Visit: Payer: BLUE CROSS/BLUE SHIELD | Admitting: Family Medicine

## 2017-07-13 ENCOUNTER — Encounter: Payer: Self-pay | Admitting: Family Medicine

## 2017-07-13 NOTE — Telephone Encounter (Signed)
Please go ahead and do a prescription for 30 mg of her Adderall XR 1 daily patient may come on Wednesday to pick this up

## 2017-07-14 ENCOUNTER — Telehealth: Payer: Self-pay | Admitting: Family Medicine

## 2017-07-14 MED ORDER — AMPHETAMINE-DEXTROAMPHET ER 30 MG PO CP24: 30.0000 mg | ORAL_CAPSULE | Freq: Every day | ORAL | 0 refills | Status: DC

## 2017-07-14 NOTE — Telephone Encounter (Signed)
Please see the written note on my chart message sent to the nurse pool-I am sure the patient will be calling about this if we do not respond thank you

## 2017-07-14 NOTE — Addendum Note (Signed)
Addended by: Dairl Ponder on: 07/14/2017 04:07 PM   Modules accepted: Orders

## 2017-07-14 NOTE — Telephone Encounter (Signed)
Prescription up front for pick up. Patient notified via my chart.

## 2017-07-28 ENCOUNTER — Ambulatory Visit (INDEPENDENT_AMBULATORY_CARE_PROVIDER_SITE_OTHER): Payer: BLUE CROSS/BLUE SHIELD | Admitting: Family Medicine

## 2017-07-28 ENCOUNTER — Encounter: Payer: Self-pay | Admitting: Family Medicine

## 2017-07-28 VITALS — BP 114/76 | Ht 66.0 in | Wt 150.0 lb

## 2017-07-28 DIAGNOSIS — F988 Other specified behavioral and emotional disorders with onset usually occurring in childhood and adolescence: Secondary | ICD-10-CM | POA: Diagnosis not present

## 2017-07-28 MED ORDER — AMPHETAMINE-DEXTROAMPHET ER 20 MG PO CP24: ORAL_CAPSULE | ORAL | 0 refills | Status: DC

## 2017-07-28 MED ORDER — CLONAZEPAM 0.5 MG PO TABS: ORAL_TABLET | ORAL | 2 refills | Status: DC

## 2017-07-28 NOTE — Progress Notes (Signed)
   Subjective:    Patient ID: Cheryl Rowe, female    DOB: [birth year], 56 y.o.   MRN: [id number]  HPI  Patient is here today to follow up on her pain management. She states she had her Oxycodone 10-325 decreased at last visit to 2 daily instead of the three she had been taking. She states she is feeling pretty good with the current dose.No problems. Would like to discuss taking her Adderall BID, instead of the once daily. Has questions regarding the flu shot.  The patient states that she feels like her Adderall wears off by lunch time she states it does help her focus and be able to concentrate on things she is interested in taking a higher dose possibly a twice daily dose with  Review of Systems  Constitutional: Negative for activity change, appetite change and fatigue.  HENT: Negative for congestion.   Respiratory: Negative for cough.   Cardiovascular: Negative for chest pain.  Gastrointestinal: Negative for abdominal pain.  Skin: Negative for color change.  Neurological: Negative for headaches.  Psychiatric/Behavioral: Negative for behavioral problems.       Objective:   Physical Exam  Constitutional: She appears well-nourished. No distress.  HENT:  Head: Normocephalic.  Right Ear: External ear normal.  Left Ear: External ear normal.  Eyes: Right eye exhibits no discharge. Left eye exhibits no discharge.  Neck: No tracheal deviation present.  Cardiovascular: Normal rate, regular rhythm and normal heart sounds.  No murmur heard. Pulmonary/Chest: Effort normal and breath sounds normal. No respiratory distress. She has no wheezes. She has no rales.  Musculoskeletal: She exhibits no edema.  Lymphadenopathy:    She has no cervical adenopathy.  Neurological: She is alert.  Psychiatric: Her behavior is normal.  Vitals reviewed.         Assessment & Plan:  ADD-patient feels medication not doing enough we will increase her medicine currently she is doing 30 mg XR daily we  will go to 20 mg XR in the morning 20 mg XR at 1 PM she will give [place] feedback in a couple weeks' time how that is doing if it is not doing enough we will increase it to 25 mg XR 2 per day that would be the maximum we would do  Chronic pain she is gradually reducing the amount of pain medicine she is using she is currently taking a half a tablet 3 times a day she will try to go down to 2 per day  Patient needed up-to-date on her nerve medication she states does not cause drowsiness and she is try to get away from her pain medicine  If the patient has progressive troubles with ADD or other similar issues mental health referral is the next step

## 2017-08-04 ENCOUNTER — Telehealth: Payer: Self-pay | Admitting: Family Medicine

## 2017-08-04 NOTE — Telephone Encounter (Signed)
Last seen 07/28/17. Pt wants to increase pain med this month because her back and hip are hurting from going out shopping. She has enough til Sunday but is worried about the snow coming and she doesn't want to be trapped in the house and not able to get pain med.

## 2017-08-04 NOTE — Telephone Encounter (Signed)
Pt is requesting a refill on  oxyCODONE-acetaminophen (PERCOCET) 10-325 MG tablet Pt is also wanting to know if she can get the quantity increased to 3 a day just for this month. Please advise.

## 2017-08-04 NOTE — Telephone Encounter (Signed)
Nurses please call the pharmacy that she got this last filled that.  Find out how many tablets she got in on what date.  I tried to check the drug registry and it would not let me into [place]  drug registry

## 2017-08-05 MED ORDER — OXYCODONE-ACETAMINOPHEN 10-325 MG PO TABS: 1.0000 | ORAL_TABLET | Freq: Three times a day (TID) | ORAL | 0 refills | Status: DC | PRN

## 2017-08-05 NOTE — Telephone Encounter (Signed)
Per Pharmacist at the [facility] she states the pt came in on 04/04/2017 got # 180,05/04/2017 # 150,06/04/2017 #120, on 07/05/2017 # 90.This was for the Oxycodone 10/325

## 2017-08-05 NOTE — Telephone Encounter (Signed)
Prescription upfront for pick up. Patient notified. 

## 2017-08-05 NOTE — Telephone Encounter (Signed)
She may have one prescription for oxycodone 10 mg/325 mg, #90, one 3 times a day when necessary pain she may have this filled for today's date

## 2017-08-25 ENCOUNTER — Telehealth: Payer: Self-pay | Admitting: Family Medicine

## 2017-08-25 ENCOUNTER — Encounter: Payer: Self-pay | Admitting: Family Medicine

## 2017-08-25 NOTE — Telephone Encounter (Signed)
Pt is requesting refills on amphetamine-dextroamphetamine (ADDERALL XR) 20 MG 24 hr capsule oxyCODONE-acetaminophen (PERCOCET) 10-325 MG tablet  Pt is aware that Dr Nicki Reaper is out today.

## 2017-08-26 ENCOUNTER — Other Ambulatory Visit: Payer: Self-pay | Admitting: *Deleted

## 2017-08-26 MED ORDER — OXYCODONE-ACETAMINOPHEN 10-325 MG PO TABS: ORAL_TABLET | ORAL | 0 refills | Status: DC

## 2017-08-26 MED ORDER — AMPHETAMINE-DEXTROAMPHET ER 25 MG PO CP24: ORAL_CAPSULE | ORAL | 0 refills | Status: DC

## 2017-08-26 MED ORDER — AMPHETAMINE-DEXTROAMPHET ER 20 MG PO CP24: ORAL_CAPSULE | ORAL | 0 refills | Status: DC

## 2017-08-26 NOTE — Telephone Encounter (Signed)
This was handled via my chart messaging please see this for further details

## 2017-08-26 NOTE — Telephone Encounter (Signed)
Nurses-please go ahead with Adderall prescription 25 mg XR 2 daily.  Patient prefers brand name only.  #60.  Also may have oxycodone 10 mg/325 #75, 1/2 tablet every 4 hours as needed pain maximum 5 1/2  tablets/day

## 2017-08-28 LAB — BASIC METABOLIC PANEL
BUN/Creatinine Ratio: 24 — ABNORMAL HIGH (ref 9–23)
BUN: 17 mg/dL (ref 6–24)
CO2: 28 mmol/L (ref 20–29)
Calcium: 10.9 mg/dL — ABNORMAL HIGH (ref 8.7–10.2)
Chloride: 102 mmol/L (ref 96–106)
Creatinine, Ser: 0.71 mg/dL (ref 0.57–1.00)
GFR calc Af Amer: 111 mL/min/{1.73_m2} (ref >59)
GFR calc non Af Amer: 96 mL/min/{1.73_m2} (ref >59)
Glucose: 74 mg/dL (ref 65–99)
Potassium: 4.4 mmol/L (ref 3.5–5.2)
Sodium: 143 mmol/L (ref 134–144)

## 2017-08-28 LAB — TSH: TSH: 0.249 u[IU]/mL — ABNORMAL LOW (ref 0.450–4.500)

## 2017-08-28 LAB — IRON: Iron: 66 ug/dL (ref 27–159)

## 2017-08-28 LAB — FERRITIN: Ferritin: 66 ng/mL (ref 15–150)

## 2017-08-30 NOTE — Addendum Note (Signed)
Addended by: Karle Barr on: 08/30/2017 11:43 AM   Modules accepted: Orders

## 2017-09-27 ENCOUNTER — Ambulatory Visit (INDEPENDENT_AMBULATORY_CARE_PROVIDER_SITE_OTHER): Payer: BLUE CROSS/BLUE SHIELD | Admitting: Family Medicine

## 2017-09-27 ENCOUNTER — Encounter: Payer: Self-pay | Admitting: Family Medicine

## 2017-09-27 VITALS — BP 124/72 | Ht 66.0 in | Wt 148.0 lb

## 2017-09-27 DIAGNOSIS — G8929 Other chronic pain: Secondary | ICD-10-CM | POA: Diagnosis not present

## 2017-09-27 DIAGNOSIS — F988 Other specified behavioral and emotional disorders with onset usually occurring in childhood and adolescence: Secondary | ICD-10-CM | POA: Diagnosis not present

## 2017-09-27 DIAGNOSIS — M5440 Lumbago with sciatica, unspecified side: Secondary | ICD-10-CM | POA: Diagnosis not present

## 2017-09-27 MED ORDER — OXYCODONE-ACETAMINOPHEN 10-325 MG PO TABS: ORAL_TABLET | ORAL | 0 refills | Status: DC

## 2017-09-27 MED ORDER — AMPHETAMINE-DEXTROAMPHET ER 25 MG PO CP24: ORAL_CAPSULE | ORAL | 0 refills | Status: DC

## 2017-09-27 NOTE — Progress Notes (Signed)
Subjective:    Patient ID: Cheryl Rowe, female    DOB: October 19, 1960, 57 y.o.   MRN: [id number]  HPI This patient was seen today for chronic pain  Takes for upper back pain.   The medication list was reviewed and updated.   -Compliance with medication: yes  - Number patient states they take daily: four and a half a day.   -when was the last dose patient took? This morning  The patient was advised the importance of maintaining medication and not using illegal substances with these.  Here for refills and follow up  The patient was educated that we can provide 3 monthly scripts for their medication, it is their responsibility to follow the instructions.  Side effects or complications from medications: none  Patient is aware that pain medications are meant to minimize the severity of the pain to allow their pain levels to improve to allow for better function. They are aware of that pain medications cannot totally remove their pain.  Due for UDT ( at least once per year) : last one 03/2017  Patient had an ongoing pain in the back from where she had surgery.  Causing significant pain in the midthoracic area.  With rotation and bending.  She is considering the possibility of more surgery but she hopes she does not have to do this.  She states she is having use 3 pain pills per day.  She denies it causing drowsiness she denies being addicted  She does have ADD her medicine does help her focus she would like to continue the medication she denies abusing it  Mild anxiety she has been trying a little down the amount of nerve medication she has been using      Review of Systems  Constitutional: Negative for activity change and appetite change.  HENT: Negative for congestion.   Respiratory: Negative for cough.   Cardiovascular: Negative for chest pain.  Gastrointestinal: Negative for abdominal pain and vomiting.  Skin: Negative for color change.  Neurological: Negative for weakness.    Psychiatric/Behavioral: Negative for confusion.       Objective:   Physical Exam  Constitutional: She appears well-nourished. No distress.  HENT:  Head: Normocephalic.  Right Ear: External ear normal.  Left Ear: External ear normal.  Eyes: Right eye exhibits no discharge. Left eye exhibits no discharge.  Neck: No tracheal deviation present.  Cardiovascular: Normal rate, regular rhythm and normal heart sounds.  No murmur heard. Pulmonary/Chest: Effort normal and breath sounds normal. No respiratory distress. She has no wheezes. She has no rales.  Musculoskeletal: She exhibits no edema.  Lymphadenopathy:    She has no cervical adenopathy.  Neurological: She is alert.  Psychiatric: Her behavior is normal.  Vitals reviewed.         Assessment & Plan:  The patient was seen today as part of a comprehensive visit regarding pain control. Patient's compliance with the medication as well as discussion regarding effectiveness was completed. Prescriptions were written. Patient was advised to follow-up in 3 months. The patient was assessed for any signs of severe side effects. The patient was advised to take the medicine as directed and to report to [place] if any side effect issues.  Drug registry was checked 3 prescriptions given Currently using 3 pills/day  Patient was encouraged to stick with her ADD medicine this is helping her.  Continue current measures  Nerve medication she should use infrequently and try to taper off of this if possible  Depression stable follow-up 3 months

## 2017-09-28 LAB — PTH, INTACT AND CALCIUM
Calcium: 10.7 mg/dL — ABNORMAL HIGH (ref 8.7–10.2)
PTH: 47 pg/mL (ref 15–65)

## 2017-09-28 LAB — TSH: TSH: 1.08 u[IU]/mL (ref 0.450–4.500)

## 2017-09-28 LAB — T3: T3, Total: 155 ng/dL (ref 71–180)

## 2017-09-28 LAB — T4, FREE: Free T4: 1.07 ng/dL (ref 0.82–1.77)

## 2017-09-30 ENCOUNTER — Encounter: Payer: Self-pay | Admitting: Family Medicine

## 2017-09-30 ENCOUNTER — Telehealth: Payer: Self-pay | Admitting: Family Medicine

## 2017-09-30 DIAGNOSIS — Z1211 Encounter for screening for malignant neoplasm of colon: Secondary | ICD-10-CM

## 2017-09-30 NOTE — Telephone Encounter (Signed)
Patient sent a message through MyChart requesting to be set up for a colonscopy at Dr. Olevia Perches office.  Please advise.   Preferred Date Range: From 09/30/2017 To 02/28/2018    Preferred Times:   Sunday, Monday, Tuesday, Wednesday, Thursday, Friday, Saturday   11:00 AM - 9:00 PM    Reason: To address the following health maintenance concerns.  Colonoscopy    Comments:  Dr. Nicki Reaper,    I would like to get set up for a colonoscopy. Anytime between now and the first week of July would be fine. I would like my appt: to be with Dr. Nelda Severe sure of the spelling).     Thanks a Cheryl Rowe

## 2017-09-30 NOTE — Telephone Encounter (Signed)
Please advise 

## 2017-09-30 NOTE — Telephone Encounter (Signed)
Referral ordered in EPIC. 

## 2017-09-30 NOTE — Telephone Encounter (Signed)
Please give referral 

## 2017-10-06 ENCOUNTER — Encounter: Payer: Self-pay | Admitting: Family Medicine

## 2017-10-07 NOTE — Addendum Note (Signed)
Addended by: Karle Barr on: 10/07/2017 09:33 AM   Modules accepted: Orders

## 2017-10-11 ENCOUNTER — Encounter (INDEPENDENT_AMBULATORY_CARE_PROVIDER_SITE_OTHER): Payer: Self-pay | Admitting: *Deleted

## 2017-10-11 ENCOUNTER — Encounter: Payer: Self-pay | Admitting: Family Medicine

## 2017-10-13 ENCOUNTER — Telehealth: Payer: Self-pay | Admitting: Family Medicine

## 2017-10-13 NOTE — Telephone Encounter (Signed)
Patient is changing pharmacies again.  She is now going to [facility] on [address].  She said that she wanted to let the nurse know so that we can update her pharmacy.  Also, she will need a new Rx for klonopin 0.5 mg tab, but she is not out yet.  She would just like to give the nurse a heads up that she will need this in the near future.

## 2017-10-14 ENCOUNTER — Other Ambulatory Visit: Payer: Self-pay | Admitting: *Deleted

## 2017-10-14 MED ORDER — CLONAZEPAM 0.5 MG PO TABS: ORAL_TABLET | ORAL | 1 refills | Status: DC

## 2017-10-14 NOTE — Telephone Encounter (Signed)
Discussed with pt that script sent to new pharm and due to be filled march 6th but can fill march 5th. Pt verbalized understanding.

## 2017-10-14 NOTE — Telephone Encounter (Signed)
Next prescription for Klonopin is due on 6 March she may have a new prescription sent to her new pharmacy for 60 tablets 1 refill she may have this filled on March 5-nurses-if you want to print the prescription and I will be happy to sign

## 2017-10-30 ENCOUNTER — Encounter: Payer: Self-pay | Admitting: Family Medicine

## 2017-11-02 NOTE — Telephone Encounter (Signed)
Please relayed to the patient that I read her message.  I reviewed her chart.  I recommend hepatitis C antibody and HIV antibody as screening tests which is recommended for all adults middle age.  This is a one-time screening test.  I also recommend lipid profile because of hyperlipidemia.  She can do this lab work before her follow-up visit in April.

## 2017-11-03 ENCOUNTER — Encounter: Payer: Self-pay | Admitting: "Endocrinology

## 2017-11-03 ENCOUNTER — Ambulatory Visit (INDEPENDENT_AMBULATORY_CARE_PROVIDER_SITE_OTHER): Payer: BLUE CROSS/BLUE SHIELD | Admitting: "Endocrinology

## 2017-11-03 NOTE — Progress Notes (Signed)
Consult Note       11/03/2017, 6:07 PM  Cheryl Rowe is a 57 y.o.-year-old female, referred by her PCP, Dr. Wolfgang Phoenix for evaluation for hypercalcemia.  Past Medical History:  Diagnosis Date  . ADHD (attention deficit hyperactivity disorder)   . Depression   . Family history of adverse reaction to anesthesia    sister has PONV  . GERD (gastroesophageal reflux disease)   . Hyperlipidemia   . Hypertension   . Migraine   . Scoliosis    Teenage  . Spondylolisthesis of lumbosacral region   . Wears glasses     Past Surgical History:  Procedure Laterality Date  . CERVICAL ABLATION    . CHOLECYSTECTOMY    . TONSILLECTOMY      Social History   Tobacco Use  . Smoking status: Never Smoker  . Smokeless tobacco: Never Used  Substance Use Topics  . Alcohol use: No  . Drug use: No    Outpatient Encounter Medications as of 11/03/2017  Medication Sig  . amphetamine-dextroamphetamine (ADDERALL XR) 25 MG 24 hr capsule Take 2 tablets daily  . clonazePAM (KLONOPIN) 0.5 MG tablet One po BID  . lisinopril (ZESTRIL) 2.5 MG tablet Take 1 tablet (2.5 mg total) by mouth daily.  Marland Kitchen omeprazole (PRILOSEC) 40 MG capsule Take 1 capsule (40 mg total) by mouth daily.  . traZODone (DESYREL) 100 MG tablet Take 1 tablet (100 mg total) by mouth at bedtime.  . Venlafaxine HCl 75 MG TB24 TAKE THREE TABLETS BY MOUTH AT NIGHT  . Calcium-Magnesium-Zinc (CAL-MAG-ZINC PO) Take 1 tablet by mouth 3 (three) times daily.  . Multiple Vitamin (MULTIVITAMIN WITH MINERALS) TABS tablet Take 1 tablet by mouth daily.  Marland Kitchen oxyCODONE-acetaminophen (PERCOCET) 10-325 MG tablet One tid prn pain  . Wheat Dextrin (BENEFIBER) POWD Take 1 scoop by mouth at bedtime.   No facility-administered encounter medications on file as of 11/03/2017.     Allergies  Allergen Reactions  . Pravastatin     Legs ache  . Cymbalta [Duloxetine Hcl] Other (See Comments)    Aggressive       HPI  Cheryl Rowe was diagnosed with hypercalcemia in December 2019 when she was found to have calcium level of 10.9.  In November 2019 repeat labs show mildly elevated calcium of 10.7 associated with high normal PTH of 47.  I reviewed patient's pertinent labs:  Lab Results  Component Value Date   PTH 47 09/27/2017   PTH Comment 09/27/2017   CALCIUM 10.7 (H) 09/27/2017   CALCIUM 10.9 (H) 08/27/2017   CALCIUM 9.2 11/11/2016   CALCIUM 10.3 11/09/2016   CALCIUM 10.2 04/22/2016   CALCIUM 10.2 04/23/2015   CALCIUM 8.5 (L) 04/13/2015   CALCIUM 8.6 (L) 04/12/2015   CALCIUM 9.8 04/11/2015   CALCIUM 9.1 03/22/2014   No prior history of fragility fractures or falls. No history of  kidney stones.  She did not have any prior bone density measurement.  She is 10 years postmenopausal.  Reports some unquantified height loss, patient has history of spondylolisthesis at L5-S1 level.  No history of CKD. Last BUN/Cr: Lab Results  Component Value Date   BUN 17  08/27/2017   CREATININE 0.71 08/27/2017    she is not on HCTZ or other thiazide therapy.  Patient takes multiple supplements of vitamins and elements including calcium/magnesium/zinc and vitamin D.  Did so for several years.  In 2016, she was found to have mild hypocalcemia of 8.5-8.6.   she does not have a family history of hypercalcemia, pituitary tumors, thyroid cancer, or osteoporosis.   I reviewed her chart and she also has a history of multinodular goiter status post biopsy of right-sided thyroid nodule.  FNA results are not available to review, she did this procedure in [place].    ROS:  Constitutional: + Recent intentional weight loss of 13 pounds since November 2018, no fatigue, no subjective hyperthermia, no subjective hypothermia Eyes: no blurry vision, no xerophthalmia ENT: no sore throat, no nodules palpated in throat, no dysphagia/odynophagia, no hoarseness Cardiovascular: no Chest Pain, no Shortness of  Breath, no palpitations, no leg swelling Respiratory: no cough, no SOB Gastrointestinal: no Nausea/Vomiting/Diarhhea Musculoskeletal: no muscle/joint aches Skin: no rashes Neurological: no tremors, no numbness, no tingling, no dizziness Psychiatric: no depression, no anxiety  PE: BP 136/82   Pulse 90   Ht 5\' 6"  (1.676 m)   Wt 137 lb (62.1 kg)   BMI 22.11 kg/m  Wt Readings from Last 3 Encounters:  11/03/17 137 lb (62.1 kg)  09/27/17 148 lb (67.1 kg)  07/28/17 150 lb 0.6 oz (68.1 kg)   Constitutional: + appropriate weight for height, not in acute distress, normal state of mind Eyes: PERRLA, EOMI, no exophthalmos ENT: moist mucous membranes, + enlarged nodular thyromegaly, no cervical lymphadenopathy Cardiovascular: normal precordial activity, Regular Rate and Rhythm, no Murmur/Rubs/Gallops Respiratory:  adequate breathing efforts, no gross chest deformity, Clear to auscultation bilaterally Gastrointestinal: abdomen soft, Non -tender, No distension, Bowel Sounds present Musculoskeletal: no gross deformities, strength intact in all four extremities Skin: moist, warm, no rashes Neurological: no tremor with outstretched hands, Deep tendon reflexes normal in all four extremities.   CMP ( most recent) CMP     Component Value Date/Time   NA 143 08/27/2017 1412   K 4.4 08/27/2017 1412   CL 102 08/27/2017 1412   CO2 28 08/27/2017 1412   GLUCOSE 74 08/27/2017 1412   GLUCOSE 139 (H) 11/11/2016 0332   BUN 17 08/27/2017 1412   CREATININE 0.71 08/27/2017 1412   CALCIUM 10.7 (H) 09/27/2017 1404   PROT 7.3 04/23/2015 1207   ALBUMIN 4.8 04/23/2015 1207   AST 26 04/23/2015 1207   ALT 21 04/23/2015 1207   ALKPHOS 99 04/23/2015 1207   BILITOT <0.2 04/23/2015 1207   GFRNONAA 96 08/27/2017 1412   GFRAA 111 08/27/2017 1412     Lipid Panel ( most recent) Lipid Panel     Component Value Date/Time   CHOL 198 03/23/2017 1514   TRIG 123 03/23/2017 1514   HDL 52 03/23/2017 1514    CHOLHDL 3.8 03/23/2017 1514   CHOLHDL 4.0 03/15/2014 1259   VLDL 40 03/15/2014 1259   LDLCALC 121 (H) 03/23/2017 1514      Lab Results  Component Value Date   TSH 1.080 09/27/2017   TSH 0.249 (L) 08/27/2017   TSH 1.050 04/22/2016   FREET4 1.07 09/27/2017   FREET4 0.79 (L) 04/22/2016      Assessment: 1. Hypercalcemia  2.  Multinodular goiter  Plan: Patient has had several instances of elevated calcium, with the highest level being at 10.9 mg/dL. A corresponding intact PTH level was also high normal at 47.  -  Patient also had vitamin D deficiency currently on vitamin D supplements.  Her vitamin D level is not known.  - No apparent complications from hypercalcemia/hyperparathyroidism, however patient never had a bone density measurement.    No history of  nephrolithiasis, fragility fractures. No abdominal pain, no major mood disorders, no bone pain. -At this time the cause of her mild hypercalcemia is not determined also it is possible that her supplements with calcium might have contributed.  It is possible that she may have early mild primary hyperparathyroidism. -I advised her to stop all calcium-containing supplements at this time. - I discussed with the patient about the physiology of calcium and parathyroid hormone, and possible  effects of  increased PTH/ Calcium , including kidney stones, cardiac dysrhythmias, osteoporosis, abdominal pain, etc.   - The work up so far is not sufficient to reach a conclusion for definitive therapy.  she  needs more studies after she stopped her current calcium containing supplements to confirm and classify the parathyroid dysfunction she may have. I will proceed to obtain  repeat intact PTH/calcium, serum magnesium, serum phosphorus, and vitamin D panel. -If he continues to have hypercalcemia associated with high or high normal PTH,  it will be essential to obtain 24-hour urine calcium/creatinine to rule out the rare but important cause of mild  elevation in calcium and PTH- FHH ( Familial Hypocalciuric Hypercalcemia), which may not require any active intervention.  -If she is confirmed to have primary hyperparathyroidism, she will be considered for surgical treatment.  -Patient is 10 years postmenopausal, wishes to have her bone density done at her OB/GYN doctor office.  I will request for her next DEXA scan to include the distal  33% of  radius for evaluation of cortical bone, which is predominantly affected by hyperparathyroidism.   -Regarding her multinodular goiter :  - she is status post fine-needle aspiration of right-sided thyroid lobe nodule, we will request for her official cytology report.  She did have normal thyroid function test in January 2019.   - Return in about 8 weeks (around 12/29/2017), or Request for Thyroid biopsy from [facility] , for follow up with pre-visit labs.   Glade Lloyd, MD [facility] [address] [place], [place] [address] Phone: [phone]  Fax: [phone]    This note was partially dictated with voice recognition software. Similar sounding words can be transcribed inadequately or may not  be corrected upon review.  11/03/2017, 6:07 PM

## 2017-11-20 ENCOUNTER — Encounter: Payer: Self-pay | Admitting: Family Medicine

## 2017-11-22 NOTE — Telephone Encounter (Signed)
Pt did sign a pain contact on 04/22/17

## 2017-11-24 ENCOUNTER — Telehealth: Payer: Self-pay | Admitting: Family Medicine

## 2017-11-24 NOTE — Telephone Encounter (Signed)
Nurses-I have reviewed over the patient's request.  #1 she may have a prescription for her pain medicine to be 4 tablets/day.  Percocet 10 mg / 325 mg 1 taken 4 times daily as needed for pain.  She may have a prescription for 60 tablets.  I would like to see this patient on the week of April 8.  Also please advise the patient that I would like for her to cut down on her clonazepam to half of what she normally takes.  When she follows up in a couple weeks we will be discussing that she will need to stop this medicine.  The combination of her nerve medicine with her pain medicine increases the risk of accidental overdose which is unacceptable for her health-please document that she understands this

## 2017-11-24 NOTE — Telephone Encounter (Signed)
Nurses-please see phone message

## 2017-11-25 ENCOUNTER — Other Ambulatory Visit: Payer: Self-pay

## 2017-11-25 MED ORDER — OXYCODONE-ACETAMINOPHEN 10-325 MG PO TABS: ORAL_TABLET | ORAL | 0 refills | Status: DC

## 2017-11-25 NOTE — Telephone Encounter (Signed)
Spoke with patient, script printed,signed and waiting for pick up. Pt verbalized understanding of tapering off clonazepam. Understood increased risk of accidental overdose. Is making appointment for week of April 8.

## 2017-12-06 ENCOUNTER — Encounter: Payer: Self-pay | Admitting: Family Medicine

## 2017-12-06 ENCOUNTER — Other Ambulatory Visit: Payer: Self-pay | Admitting: "Endocrinology

## 2017-12-06 ENCOUNTER — Ambulatory Visit (INDEPENDENT_AMBULATORY_CARE_PROVIDER_SITE_OTHER): Payer: BLUE CROSS/BLUE SHIELD | Admitting: Family Medicine

## 2017-12-06 VITALS — BP 112/72 | Ht 66.0 in | Wt 139.0 lb

## 2017-12-06 DIAGNOSIS — F988 Other specified behavioral and emotional disorders with onset usually occurring in childhood and adolescence: Secondary | ICD-10-CM | POA: Diagnosis not present

## 2017-12-06 DIAGNOSIS — G8929 Other chronic pain: Secondary | ICD-10-CM | POA: Diagnosis not present

## 2017-12-06 DIAGNOSIS — M5440 Lumbago with sciatica, unspecified side: Secondary | ICD-10-CM

## 2017-12-06 DIAGNOSIS — M4120 Other idiopathic scoliosis, site unspecified: Secondary | ICD-10-CM | POA: Diagnosis not present

## 2017-12-06 MED ORDER — AMPHETAMINE-DEXTROAMPHET ER 25 MG PO CP24: ORAL_CAPSULE | ORAL | 0 refills | Status: DC

## 2017-12-06 MED ORDER — OXYCODONE-ACETAMINOPHEN 10-325 MG PO TABS: ORAL_TABLET | ORAL | 0 refills | Status: DC

## 2017-12-06 MED ORDER — VENLAFAXINE HCL ER 75 MG PO TB24: ORAL_TABLET | ORAL | 2 refills | Status: DC

## 2017-12-06 MED ORDER — CLONAZEPAM 0.5 MG PO TABS: ORAL_TABLET | ORAL | 5 refills | Status: DC

## 2017-12-06 NOTE — Progress Notes (Signed)
   Subjective:    Patient ID: Cheryl Rowe, female    DOB: 09-08-1960, 57 y.o.   MRN: [id number]  HPI This patient was seen today for chronic pain  The medication list was reviewed and updated.   -Compliance with medication: yes  - Number patient states they take daily: 4 a day  -when was the last dose patient took? today  The patient was advised the importance of maintaining medication and not using illegal substances with these.  Here for refills and follow up  The patient was educated that we can provide 3 monthly scripts for their medication, it is their responsibility to follow the instructions.  Side effects or complications from medications: none  Patient is aware that pain medications are meant to minimize the severity of the pain to allow their pain levels to improve to allow for better function. They are aware of that pain medications cannot totally remove their pain.  Due for UDT ( at least once per year) : 8/18        Review of Systems  Constitutional: Negative for activity change and appetite change.  HENT: Negative for congestion.   Respiratory: Negative for cough.   Cardiovascular: Negative for chest pain.  Gastrointestinal: Negative for abdominal pain and vomiting.  Skin: Negative for color change.  Neurological: Negative for weakness.  Psychiatric/Behavioral: Negative for confusion.       Objective:   Physical Exam  Constitutional: She appears well-nourished. No distress.  HENT:  Head: Normocephalic.  Right Ear: External ear normal.  Left Ear: External ear normal.  Eyes: Right eye exhibits no discharge. Left eye exhibits no discharge.  Neck: No tracheal deviation present.  Cardiovascular: Normal rate, regular rhythm and normal heart sounds.  No murmur heard. Pulmonary/Chest: Effort normal and breath sounds normal. No respiratory distress. She has no wheezes. She has no rales.  Musculoskeletal: She exhibits no edema.  Lymphadenopathy:    She  has no cervical adenopathy.  Neurological: She is alert.  Psychiatric: Her behavior is normal.  Vitals reviewed.         Assessment & Plan:  Chronic pain and discomfort Taking pain medicine maximum 4 times a day Drug registry checked Does not abuse medications 3 prescriptions given  Severe anxiety issues patient uses clonazepam very rarely patient was told never to use this near bedtime or before sleeping-patient has depression but is under good control with Effexor  She has severe back pain her neurosurgeon in [place] is telling her he could do surgery but he is not sure it will help her he will be having her see a orthopedic specialist in [place]  ADD tolerating medication currently.  States she is keeping her weight near 138 pounds Denies abusing the medicine we will recheck her in 3 months if her weight is gone down we will need to back down on this medication

## 2017-12-07 LAB — PARATHYROID HORMONE, INTACT (NO CA): PTH: 69 pg/mL — ABNORMAL HIGH (ref 15–65)

## 2017-12-07 LAB — VITAMIN D 25 HYDROXY (VIT D DEFICIENCY, FRACTURES): Vit D, 25-Hydroxy: 53.9 ng/mL (ref 30.0–100.0)

## 2017-12-07 LAB — TSH: TSH: 1.79 u[IU]/mL (ref 0.450–4.500)

## 2017-12-07 LAB — MAGNESIUM: Magnesium: 2.4 mg/dL — ABNORMAL HIGH (ref 1.6–2.3)

## 2017-12-07 LAB — T4, FREE: Free T4: 1.33 ng/dL (ref 0.82–1.77)

## 2017-12-07 LAB — PHOSPHORUS: Phosphorus: 3.4 mg/dL (ref 2.5–4.5)

## 2017-12-08 ENCOUNTER — Encounter (INDEPENDENT_AMBULATORY_CARE_PROVIDER_SITE_OTHER): Payer: Self-pay | Admitting: *Deleted

## 2017-12-15 ENCOUNTER — Encounter: Payer: Self-pay | Admitting: "Endocrinology

## 2017-12-27 ENCOUNTER — Ambulatory Visit: Payer: BLUE CROSS/BLUE SHIELD | Admitting: Family Medicine

## 2017-12-28 ENCOUNTER — Other Ambulatory Visit: Payer: Self-pay | Admitting: Family Medicine

## 2017-12-29 ENCOUNTER — Ambulatory Visit (INDEPENDENT_AMBULATORY_CARE_PROVIDER_SITE_OTHER): Payer: BLUE CROSS/BLUE SHIELD | Admitting: "Endocrinology

## 2017-12-29 ENCOUNTER — Encounter: Payer: Self-pay | Admitting: "Endocrinology

## 2017-12-29 DIAGNOSIS — E210 Primary hyperparathyroidism: Secondary | ICD-10-CM

## 2017-12-29 DIAGNOSIS — M810 Age-related osteoporosis without current pathological fracture: Secondary | ICD-10-CM | POA: Diagnosis not present

## 2017-12-29 MED ORDER — ALENDRONATE SODIUM 70 MG PO TABS: 70.0000 mg | ORAL_TABLET | ORAL | 6 refills | Status: DC

## 2017-12-29 NOTE — Progress Notes (Signed)
Endocrinology follow-up note       12/29/2017, 5:47 PM  Cheryl Rowe is a 57 y.o.-year-old female, referred by her PCP, Dr. Wolfgang Phoenix for evaluation for hypercalcemia.  Past Medical History:  Diagnosis Date  . ADHD (attention deficit hyperactivity disorder)   . Depression   . Family history of adverse reaction to anesthesia    sister has PONV  . GERD (gastroesophageal reflux disease)   . Hyperlipidemia   . Hypertension   . Migraine   . Scoliosis    Teenage  . Spondylolisthesis of lumbosacral region   . Wears glasses     Past Surgical History:  Procedure Laterality Date  . CERVICAL ABLATION    . CHOLECYSTECTOMY    . TONSILLECTOMY      Social History   Tobacco Use  . Smoking status: Never Smoker  . Smokeless tobacco: Never Used  Substance Use Topics  . Alcohol use: No  . Drug use: No    Outpatient Encounter Medications as of 12/29/2017  Medication Sig  . alendronate (FOSAMAX) 70 MG tablet Take 1 tablet (70 mg total) by mouth once a week. Take with a full glass of water on an empty stomach.  Marland Kitchen amphetamine-dextroamphetamine (ADDERALL XR) 25 MG 24 hr capsule Take 2 tablets daily  . clonazePAM (KLONOPIN) 0.5 MG tablet Max 1/2 bid prn  . lisinopril (PRINIVIL,ZESTRIL) 2.5 MG tablet TAKE 1 TABLET BY MOUTH EVERY DAY CANCEL AMLODIPINE  . Multiple Vitamin (MULTIVITAMIN WITH MINERALS) TABS tablet Take 1 tablet by mouth daily.  Marland Kitchen omeprazole (PRILOSEC) 40 MG capsule Take 1 capsule (40 mg total) by mouth daily.  Marland Kitchen oxyCODONE-acetaminophen (PERCOCET) 10-325 MG tablet One 4 times daily PRN pain  . traZODone (DESYREL) 100 MG tablet Take 1 tablet (100 mg total) by mouth at bedtime.  . Venlafaxine HCl 75 MG TB24 TAKE THREE TABLETS BY MOUTH AT NIGHT  . Wheat Dextrin (BENEFIBER) POWD Take 1 scoop by mouth at bedtime.  . [DISCONTINUED] Calcium-Magnesium-Zinc (CAL-MAG-ZINC PO) Take 1 tablet by mouth 3 (three) times daily.   No  facility-administered encounter medications on file as of 12/29/2017.     Allergies  Allergen Reactions  . Pravastatin     Legs ache  . Cymbalta [Duloxetine Hcl] Other (See Comments)    Aggressive      HPI  Cheryl Rowe was diagnosed with hypercalcemia in December 2019 when she was found to have calcium level of 10.9.  In November 2019 repeat labs showed mildly elevated calcium of 10.7 associated with high normal PTH of 47. -Her most recent labs show elevated PTH of 69.  Lab Results  Component Value Date   PTH 69 (H) 12/06/2017   PTH 47 09/27/2017   PTH Comment 09/27/2017   CALCIUM 10.7 (H) 09/27/2017   CALCIUM 10.9 (H) 08/27/2017   CALCIUM 9.2 11/11/2016   CALCIUM 10.3 11/09/2016   CALCIUM 10.2 04/22/2016   CALCIUM 10.2 04/23/2015   CALCIUM 8.5 (L) 04/13/2015   CALCIUM 8.6 (L) 04/12/2015   CALCIUM 9.8 04/11/2015   CALCIUM 9.1 03/22/2014   No prior history of fragility fractures or falls. No history of  kidney stones.   -She underwent bone density on December 15, 2017 which showed osteoporosis in spine, and hips with a T score of -2.3 and -3.1 respectively.   She is 10 years postmenopausal.  Reports some unquantified height loss, patient has history of spondylolisthesis at L5-S1 level.  No history of CKD. Last BUN/Cr: Lab Results  Component Value Date   BUN 17 08/27/2017   CREATININE 0.71 08/27/2017    she is not on HCTZ or other thiazide therapy.  Patient takes multiple supplements of vitamins and elements including calcium/magnesium/zinc and vitamin D.  Did so for several years.  In 2016, she was found to have mild hypocalcemia of 8.5-8.6.   she does not have a family history of hypercalcemia, pituitary tumors, thyroid cancer, or osteoporosis.   I reviewed her chart and she also has a history of multinodular goiter status post biopsy of right-sided thyroid nodule.  FNA results are not available to review, she did this procedure in [place].     ROS:  Constitutional: + Gained 5 pounds since last visit.   Eyes: no blurry vision, no xerophthalmia ENT: no sore throat, no nodules palpated in throat, no dysphagia/odynophagia, no hoarseness Cardiovascular: no Chest Pain, no Shortness of Breath, no palpitations, no leg swelling Respiratory: no cough, no SOB Gastrointestinal: no Nausea/Vomiting/Diarhhea Musculoskeletal: no muscle/joint aches, + height loss. Skin: no rashes Neurological: no tremors, no numbness, no tingling, no dizziness Psychiatric: no depression, no anxiety  PE: BP 127/81   Pulse 60   Ht 5\' 6"  (1.676 m)   Wt 144 lb (65.3 kg)   BMI 23.24 kg/m  Wt Readings from Last 3 Encounters:  12/29/17 144 lb (65.3 kg)  12/06/17 139 lb (63 kg)  11/03/17 137 lb (62.1 kg)   Constitutional: + appropriate weight for height, not in acute distress, normal state of mind Eyes: PERRLA, EOMI, no exophthalmos ENT: moist mucous membranes, + enlarged nodular thyromegaly, no cervical lymphadenopathy Musculoskeletal: no gross deformities, strength intact in all four extremities Skin: moist, warm, no rashes Neurological: no tremor with outstretched hands    CMP     Component Value Date/Time   NA 143 08/27/2017 1412   K 4.4 08/27/2017 1412   CL 102 08/27/2017 1412   CO2 28 08/27/2017 1412   GLUCOSE 74 08/27/2017 1412   GLUCOSE 139 (H) 11/11/2016 0332   BUN 17 08/27/2017 1412   CREATININE 0.71 08/27/2017 1412   CALCIUM 10.7 (H) 09/27/2017 1404   PROT 7.3 04/23/2015 1207   ALBUMIN 4.8 04/23/2015 1207   AST 26 04/23/2015 1207   ALT 21 04/23/2015 1207   ALKPHOS 99 04/23/2015 1207   BILITOT <0.2 04/23/2015 1207   GFRNONAA 96 08/27/2017 1412   GFRAA 111 08/27/2017 1412     Lipid Panel ( most recent) Lipid Panel     Component Value Date/Time   CHOL 198 03/23/2017 1514   TRIG 123 03/23/2017 1514   HDL 52 03/23/2017 1514   CHOLHDL 3.8 03/23/2017 1514   CHOLHDL 4.0 03/15/2014 1259   VLDL 40 03/15/2014 1259   LDLCALC  121 (H) 03/23/2017 1514      Lab Results  Component Value Date   TSH 1.790 12/06/2017   TSH 1.080 09/27/2017   TSH 0.249 (L) 08/27/2017   TSH 1.050 04/22/2016   FREET4 1.33 12/06/2017   FREET4 1.07 09/27/2017   FREET4 0.79 (L) 04/22/2016    Recent Results (from the past 2160 hour(s))  T4, free     Status: None   Collection Time: 12/06/17  3:34 PM  Result Value  Ref Range   Free T4 1.33 0.82 - 1.77 ng/dL  TSH     Status: None   Collection Time: 12/06/17  3:34 PM  Result Value Ref Range   TSH 1.790 0.450 - 4.500 uIU/mL  VITAMIN D 25 Hydroxy (Vit-D Deficiency, Fractures)     Status: None   Collection Time: 12/06/17  3:34 PM  Result Value Ref Range   Vit D, 25-Hydroxy 53.9 30.0 - 100.0 ng/mL    Comment: Vitamin D deficiency has been defined by the [place] practice guideline as a level of serum 25-OH vitamin D less than 20 ng/mL (1,2). The Endocrine Society went on to further define vitamin D insufficiency as a level between 21 and 29 ng/mL (2). 1. IOM (Institute of Medicine). 2010. Dietary reference    intakes for calcium and D. [place]: The    [employer]. 2. Holick MF, [place], Bischoff-Ferrari HA, et al.    Evaluation, treatment, and prevention of vitamin D    deficiency: an Endocrine Society clinical practice    guideline. JCEM. 2011 Jul; 96(7):1911-30.   Phosphorus     Status: None   Collection Time: 12/06/17  3:34 PM  Result Value Ref Range   Phosphorus 3.4 2.5 - 4.5 mg/dL  Magnesium     Status: Abnormal   Collection Time: 12/06/17  3:34 PM  Result Value Ref Range   Magnesium 2.4 (H) 1.6 - 2.3 mg/dL  Parathyroid hormone, intact (no Ca)     Status: Abnormal   Collection Time: 12/06/17  3:34 PM  Result Value Ref Range   PTH 69 (H) 15 - 65 pg/mL     Assessment: 1. 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  Osteoporosis 3.  Multinodular goiter  Plan: Patient has had several instances of elevated  calcium, with the highest level being at 10.9 mg/dL, her recent PTH was high at 69.  -She does not have vitamin D deficiency, recent vitamin D level is 53.9.   -She has osteoporosis of the hips and osteopenia of the spine.     No history of  nephrolithiasis, fragility fractures. No abdominal pain, no major mood disorders, no bone pain.  -This is likely  mild early primary hyperparathyroidism as the etiology of hypercalcemia.    Plan: 1-I advised her to stop all calcium-containing supplements at this time, including suspending vitamin D supplement 2.  She will need 24-hour urine calcium measurement to rule out  [place] ( Familial Hypocalciuric Hypercalcemia), which may not require any active intervention. 3.  I discussed and initiated treatment for osteoporosis with alendronate 70 mg p.o. weekly.  Side effects and precautions discussed with her.  Her DEXA scan did not include at this time one third of the forearm. 4.  If her 24-hour urine studies indicate significant hypercalciuria, she will be considered for surgical treatment-which is briefly discussed with her. -Regarding her multinodular goiter :  - she is status post fine-needle aspiration of right-sided thyroid lobe nodule, we will request for her official cytology report.  She did have normal thyroid function test in January 2019.   - Return in about 1 month (around 01/26/2018) for 24 Hours Urine Calcium.   Glade Lloyd, MD [facility] [address] [place], [place] [address] Phone: [phone]  Fax: [phone]    This note was partially dictated with voice recognition software. Similar sounding words can be transcribed inadequately or may not  be corrected upon review.  12/29/2017, 5:47 PM

## 2018-02-01 ENCOUNTER — Ambulatory Visit: Payer: BLUE CROSS/BLUE SHIELD | Admitting: "Endocrinology

## 2018-02-01 ENCOUNTER — Encounter: Payer: Self-pay | Admitting: Family Medicine

## 2018-02-01 ENCOUNTER — Ambulatory Visit (INDEPENDENT_AMBULATORY_CARE_PROVIDER_SITE_OTHER): Payer: BLUE CROSS/BLUE SHIELD | Admitting: Family Medicine

## 2018-02-01 VITALS — BP 128/82 | Temp 97.8°F | Ht 66.0 in | Wt 145.4 lb

## 2018-02-01 DIAGNOSIS — J019 Acute sinusitis, unspecified: Secondary | ICD-10-CM | POA: Diagnosis not present

## 2018-02-01 MED ORDER — AMOXICILLIN-POT CLAVULANATE 875-125 MG PO TABS: 1.0000 | ORAL_TABLET | Freq: Two times a day (BID) | ORAL | 0 refills | Status: DC

## 2018-02-01 NOTE — Progress Notes (Signed)
   Subjective:    Patient ID: Cheryl Rowe, female    DOB: Apr 07, 1961, 57 y.o.   MRN: [id number]   Cough   This is a new problem. The current episode started in the past 7 days. Associated symptoms include nasal congestion, rhinorrhea and a sore throat. Pertinent negatives include no chest pain, ear pain, fever, shortness of breath or wheezing. Associated symptoms comments: diarrhea.   Denies wheezing or difficulty breathing she wants to make sure it is not gotten into her lungs   Review of Systems  Constitutional: Negative for activity change and fever.  HENT: Positive for congestion, rhinorrhea and sore throat. Negative for ear pain.   Eyes: Negative for discharge.  Respiratory: Positive for cough. Negative for shortness of breath and wheezing.   Cardiovascular: Negative for chest pain.       Objective:   Physical Exam  Constitutional: She appears well-developed.  HENT:  Head: Normocephalic.  Nose: Nose normal.  Mouth/Throat: Oropharynx is clear and moist. No oropharyngeal exudate.  Neck: Neck supple.  Cardiovascular: Normal rate and normal heart sounds.  No murmur heard. Pulmonary/Chest: Effort normal and breath sounds normal. She has no wheezes.  Lymphadenopathy:    She has no cervical adenopathy.  Skin: Skin is warm and dry.  Nursing note and vitals reviewed.         Assessment & Plan:  Viral syndrome also mild viral bronchitis I believe that this is more of a viral upper respiratory illness along with some mild sinus symptoms and bronchitis symptoms I do not recommend antibiotic at this point but because of the nature of this it could get worse I printed the prescription of antibiotics to get filled if progressive symptoms over the next 4 5 days otherwise no need to get the medicine filled

## 2018-02-02 ENCOUNTER — Other Ambulatory Visit (INDEPENDENT_AMBULATORY_CARE_PROVIDER_SITE_OTHER): Payer: Self-pay | Admitting: *Deleted

## 2018-02-02 DIAGNOSIS — Z1211 Encounter for screening for malignant neoplasm of colon: Secondary | ICD-10-CM

## 2018-02-03 ENCOUNTER — Telehealth (INDEPENDENT_AMBULATORY_CARE_PROVIDER_SITE_OTHER): Payer: Self-pay | Admitting: *Deleted

## 2018-02-03 ENCOUNTER — Encounter (INDEPENDENT_AMBULATORY_CARE_PROVIDER_SITE_OTHER): Payer: Self-pay | Admitting: *Deleted

## 2018-02-03 DIAGNOSIS — Z1211 Encounter for screening for malignant neoplasm of colon: Secondary | ICD-10-CM | POA: Insufficient documentation

## 2018-02-03 NOTE — Telephone Encounter (Signed)
Patient needs trilyte 

## 2018-02-07 MED ORDER — PEG 3350-KCL-NA BICARB-NACL 420 G PO SOLR: 4000.0000 mL | Freq: Once | ORAL | 0 refills | Status: AC

## 2018-02-20 ENCOUNTER — Other Ambulatory Visit: Payer: Self-pay | Admitting: "Endocrinology

## 2018-02-22 LAB — CALCIUM, URINE, 24 HOUR
Calcium, 24H Urine: 77.7 mg/24 hr — ABNORMAL LOW (ref 100.0–300.0)
Calcium, Urine: 3.7 mg/dL

## 2018-02-22 LAB — CREATININE, URINE, 24 HOUR
Creatinine, 24H Ur: 932 mg/24 hr (ref 800–1800)
Creatinine, Urine: 44.4 mg/dL

## 2018-02-24 ENCOUNTER — Telehealth (INDEPENDENT_AMBULATORY_CARE_PROVIDER_SITE_OTHER): Payer: Self-pay | Admitting: *Deleted

## 2018-02-24 NOTE — Telephone Encounter (Signed)
Referring MD/PCP: scott luking   Procedure: tcs w propofol  Reason/Indication:  screening  Has patient had this procedure before?  no  If so, when, by whom and where?    Is there a family history of colon cancer?  no  Who?  What age when diagnosed?    Is patient diabetic?   no      Does patient have prosthetic heart valve or mechanical valve?  no  Do you have a pacemaker?  no  Has patient ever had endocarditis? no  Has patient had joint replacement within last 12 months?  no  Is patient constipated or do they take laxatives? no  Does patient have a history of alcohol/drug use?  no  Is patient on blood thinner such as Coumadin, Plavix and/or Aspirin? no  Medications: venlafaxine 225 mg at bedtime, percocet 10 mg qid 1 tab every 6 hrs, omeprazole 40 mg daily, lisinopril 25 mg daily, adderall 25 mg bid, clonzepam 25 mg daily  Allergies: cymbalta, pravastatin   Medication Adjustment per Dr Lindi Adie, NP:   Procedure date & time: 03/25/18 at 930

## 2018-02-24 NOTE — Telephone Encounter (Signed)
agree

## 2018-02-28 ENCOUNTER — Telehealth: Payer: Self-pay | Admitting: Family Medicine

## 2018-02-28 NOTE — Telephone Encounter (Signed)
Patient needing refill on percocet 10/325 until seen on 7/17 almost out and wont see specialist until next week.She states will pick up prescription next week. It was last filled 12/06/2017

## 2018-03-01 ENCOUNTER — Other Ambulatory Visit: Payer: Self-pay | Admitting: Family Medicine

## 2018-03-01 ENCOUNTER — Encounter: Payer: Self-pay | Admitting: "Endocrinology

## 2018-03-01 ENCOUNTER — Ambulatory Visit (INDEPENDENT_AMBULATORY_CARE_PROVIDER_SITE_OTHER): Payer: BLUE CROSS/BLUE SHIELD | Admitting: "Endocrinology

## 2018-03-01 DIAGNOSIS — E210 Primary hyperparathyroidism: Secondary | ICD-10-CM

## 2018-03-01 DIAGNOSIS — M810 Age-related osteoporosis without current pathological fracture: Secondary | ICD-10-CM | POA: Diagnosis not present

## 2018-03-01 NOTE — Progress Notes (Signed)
Endocrinology follow-up note       03/01/2018, 2:30 PM  Cheryl Rowe is a 57 y.o.-year-old female, referred by her PCP, Dr. Wolfgang Phoenix for evaluation for hypercalcemia.  Past Medical History:  Diagnosis Date  . ADHD (attention deficit hyperactivity disorder)   . Depression   . Family history of adverse reaction to anesthesia    sister has PONV  . GERD (gastroesophageal reflux disease)   . Hyperlipidemia   . Hypertension   . Migraine   . Scoliosis    Teenage  . Spondylolisthesis of lumbosacral region   . Wears glasses     Past Surgical History:  Procedure Laterality Date  . CERVICAL ABLATION    . CHOLECYSTECTOMY    . TONSILLECTOMY      Social History   Tobacco Use  . Smoking status: Never Smoker  . Smokeless tobacco: Never Used  Substance Use Topics  . Alcohol use: No  . Drug use: No    Outpatient Encounter Medications as of 03/01/2018  Medication Sig  . alendronate (FOSAMAX) 70 MG tablet Take 1 tablet (70 mg total) by mouth once a week. Take with a full glass of water on an empty stomach.  Marland Kitchen amphetamine-dextroamphetamine (ADDERALL XR) 25 MG 24 hr capsule Take 2 tablets daily  . clonazePAM (KLONOPIN) 0.5 MG tablet Max 1/2 bid prn  . lisinopril (PRINIVIL,ZESTRIL) 2.5 MG tablet TAKE 1 TABLET BY MOUTH EVERY DAY CANCEL AMLODIPINE  . Multiple Vitamin (MULTIVITAMIN WITH MINERALS) TABS tablet Take 1 tablet by mouth daily.  Marland Kitchen omeprazole (PRILOSEC) 40 MG capsule Take 1 capsule (40 mg total) by mouth daily.  Marland Kitchen oxyCODONE-acetaminophen (PERCOCET) 10-325 MG tablet One 4 times daily PRN pain  . traZODone (DESYREL) 100 MG tablet Take 1 tablet (100 mg total) by mouth at bedtime.  . Venlafaxine HCl 75 MG TB24 TAKE THREE TABLETS BY MOUTH AT NIGHT  . Wheat Dextrin (BENEFIBER) POWD Take 1 scoop by mouth at bedtime.  . [DISCONTINUED] amoxicillin-clavulanate (AUGMENTIN) 875-125 MG tablet Take 1 tablet by mouth 2 (two) times daily.  .  [DISCONTINUED] lisinopril (PRINIVIL,ZESTRIL) 2.5 MG tablet TAKE 1 TABLET BY MOUTH EVERY DAY CANCEL AMLODIPINE   No facility-administered encounter medications on file as of 03/01/2018.     Allergies  Allergen Reactions  . Pravastatin     Legs ache  . Cymbalta [Duloxetine Hcl] Other (See Comments)    Aggressive      HPI  Cheryl Rowe was diagnosed with hypercalcemia in December 2019 when she was found to have calcium level of 10.9.  In November 2019 repeat labs showed mildly elevated calcium of 10.7 associated with high normal PTH of 47. -Her most recent labs show elevated PTH of 69. -She is returning with her results for 24-hour urine calcium measurement.  Lab Results  Component Value Date   PTH 69 (H) 12/06/2017   PTH 47 09/27/2017   PTH Comment 09/27/2017   CALCIUM 10.7 (H) 09/27/2017   CALCIUM 10.9 (H) 08/27/2017   CALCIUM 9.2 11/11/2016   CALCIUM 10.3 11/09/2016   CALCIUM 10.2 04/22/2016   CALCIUM 10.2 04/23/2015   CALCIUM 8.5 (L) 04/13/2015   CALCIUM 8.6 (L) 04/12/2015   CALCIUM 9.8  04/11/2015   CALCIUM 9.1 03/22/2014   No prior history of fragility fractures or falls. No history of  kidney stones.   -She underwent bone density on December 15, 2017 which showed osteoporosis in spine, and hips with a T score of -2.3 and -3.1 respectively.  He is currently on alendronate 70 mg weekly, tolerating very well.   She is 10 years postmenopausal.  She has a steady weight lately.   patient has history of spondylolisthesis at L5-S1 level.  No history of CKD. Last BUN/Cr: Lab Results  Component Value Date   BUN 17 08/27/2017   CREATININE 0.71 08/27/2017    she is not on HCTZ or other thiazide therapy.  Patient takes multiple supplements of vitamins and elements including calcium/magnesium/zinc and vitamin D.  Did so for several years.  In 2016, she was found to have mild hypocalcemia of 8.5-8.6.   she does not have a family history of hypercalcemia, pituitary tumors,  thyroid cancer, or osteoporosis.   I reviewed her chart and she also has a history of multinodular goiter status post biopsy of right-sided thyroid nodule.  FNA results are reviewed showing benign follicular nodule in September 2017.     ROS:  Constitutional: + Steady weight  since last visit.   Eyes: no blurry vision, no xerophthalmia ENT: no sore throat, no nodules palpated in throat, no dysphagia/odynophagia, no hoarseness Cardiovascular: no Chest Pain, no Shortness of Breath, no palpitations, no leg swelling Respiratory: no cough, no SOB Gastrointestinal: no Nausea/Vomiting/Diarhhea Musculoskeletal: no muscle/joint aches, + height loss. Skin: no rashes Neurological: no tremors, no numbness, no tingling, no dizziness Psychiatric: no depression, no anxiety  PE: BP 140/77   Pulse 75   Ht 5\' 6"  (1.676 m)   Wt 144 lb (65.3 kg)   BMI 23.24 kg/m  Wt Readings from Last 3 Encounters:  03/01/18 144 lb (65.3 kg)  02/01/18 145 lb 6.4 oz (66 kg)  12/29/17 144 lb (65.3 kg)   Constitutional: + Appropriate weight for height,  not in acute distress, normal state of mind Eyes: PERRLA, EOMI, no exophthalmos ENT: moist mucous membranes, + enlarged nodular thyromegaly, no cervical lymphadenopathy Musculoskeletal: no gross deformities, strength intact in all four extremities Skin: moist, warm, no rashes Neurological: no tremor with outstretched hands    CMP     Component Value Date/Time   NA 143 08/27/2017 1412   K 4.4 08/27/2017 1412   CL 102 08/27/2017 1412   CO2 28 08/27/2017 1412   GLUCOSE 74 08/27/2017 1412   GLUCOSE 139 (H) 11/11/2016 0332   BUN 17 08/27/2017 1412   CREATININE 0.71 08/27/2017 1412   CALCIUM 10.7 (H) 09/27/2017 1404   PROT 7.3 04/23/2015 1207   ALBUMIN 4.8 04/23/2015 1207   AST 26 04/23/2015 1207   ALT 21 04/23/2015 1207   ALKPHOS 99 04/23/2015 1207   BILITOT <0.2 04/23/2015 1207   GFRNONAA 96 08/27/2017 1412   GFRAA 111 08/27/2017 1412     Lipid Panel  ( most recent) Lipid Panel     Component Value Date/Time   CHOL 198 03/23/2017 1514   TRIG 123 03/23/2017 1514   HDL 52 03/23/2017 1514   CHOLHDL 3.8 03/23/2017 1514   CHOLHDL 4.0 03/15/2014 1259   VLDL 40 03/15/2014 1259   LDLCALC 121 (H) 03/23/2017 1514      Lab Results  Component Value Date   TSH 1.790 12/06/2017   TSH 1.080 09/27/2017   TSH 0.249 (L) 08/27/2017   TSH 1.050 04/22/2016  FREET4 1.33 12/06/2017   FREET4 1.07 09/27/2017   FREET4 0.79 (L) 04/22/2016    Recent Results (from the past 2160 hour(s))  T4, free     Status: None   Collection Time: 12/06/17  3:34 PM  Result Value Ref Range   Free T4 1.33 0.82 - 1.77 ng/dL  TSH     Status: None   Collection Time: 12/06/17  3:34 PM  Result Value Ref Range   TSH 1.790 0.450 - 4.500 uIU/mL  VITAMIN D 25 Hydroxy (Vit-D Deficiency, Fractures)     Status: None   Collection Time: 12/06/17  3:34 PM  Result Value Ref Range   Vit D, 25-Hydroxy 53.9 30.0 - 100.0 ng/mL    Comment: Vitamin D deficiency has been defined by the [place] and an Endocrine Society practice guideline as a level of serum 25-OH vitamin D less than 20 ng/mL (1,2). The Endocrine Society went on to further define vitamin D insufficiency as a level between 21 and 29 ng/mL (2). 1. IOM (Institute of Medicine). 2010. Dietary reference    intakes for calcium and D. [place]: The    [employer]. 2. Holick MF, Binkley [place], Bischoff-Ferrari HA, et al.    Evaluation, treatment, and prevention of vitamin D    deficiency: an Endocrine Society clinical practice    guideline. JCEM. 2011 Jul; 96(7):1911-30.   Phosphorus     Status: None   Collection Time: 12/06/17  3:34 PM  Result Value Ref Range   Phosphorus 3.4 2.5 - 4.5 mg/dL  Magnesium     Status: Abnormal   Collection Time: 12/06/17  3:34 PM  Result Value Ref Range   Magnesium 2.4 (H) 1.6 - 2.3 mg/dL  Parathyroid hormone, intact (no Ca)     Status: Abnormal   Collection  Time: 12/06/17  3:34 PM  Result Value Ref Range   PTH 69 (H) 15 - 65 pg/mL  Calcium, urine, 24 hour     Status: Abnormal   Collection Time: 02/20/18  8:00 AM  Result Value Ref Range   Calcium, Urine 3.7 Not Estab. mg/dL   Calcium, 24H Urine 77.7 (L) 100.0 - 300.0 mg/24 hr  Creatinine, urine, 24 hour     Status: None   Collection Time: 02/20/18  8:00 AM  Result Value Ref Range   Creatinine, Urine 44.4 Not Estab. mg/dL   Creatinine, 24H Ur 932 800 - 1,800 mg/24 hr     Assessment: 1. 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  Osteoporosis 3.  Multinodular goiter-status post fine-needle aspiration in 2017  Plan: Patient has had several instances of elevated calcium, with the highest level being at 10.9 mg/dL, her recent PTH was high at 69.  -She does not have vitamin D deficiency, recent vitamin D level is 53.9.   -She has osteoporosis of the hips and osteopenia of the spine-currently on alendronate 70 mg p.o. weekly, tolerating well.   Advised to continue.  No history of  nephrolithiasis, fragility fractures. No abdominal pain, no major mood disorders, no bone pain.  -This is likely  mild early primary hyperparathyroidism as the etiology of hypercalcemia.  24-hour urine calcium measurements revealed 77 mg in 24 hours.  Plan: 1-she is not a surgical candidate.  Will be continued on observation.   I advised her to stop all calcium-containing supplements at this time, including suspending vitamin D supplement for now.  2.  I discussed the need for continued treatment for osteoporosis with alendronate 70 mg p.o. weekly.  Side effects and precautions discussed with her.  Her DEXA scan did not include at this time one third of the forearm.  Her next bone density will be in April 2021.  -Regarding her multinodular goiter :  - she is status post fine-needle aspiration of right-sided thyroid lobe nodule in September 1146 with benign follicular adenoma.  Not require intervention at this  time.  She did have normal thyroid function test in January 2019.   - Return in about 4 months (around 07/02/2018) for follow up with pre-visit labs.   Glade Lloyd, MD [facility] [address] [place], [place] [address] Phone: [phone]  Fax: [phone]    This note was partially dictated with voice recognition software. Similar sounding words can be transcribed inadequately or may not  be corrected upon review.  03/01/2018, 2:30 PM

## 2018-03-02 ENCOUNTER — Other Ambulatory Visit: Payer: Self-pay

## 2018-03-02 MED ORDER — OXYCODONE-ACETAMINOPHEN 10-325 MG PO TABS: ORAL_TABLET | ORAL | 0 refills | Status: DC

## 2018-03-02 NOTE — Telephone Encounter (Signed)
1 taken 4 times daily as needed pain-patient currently using 4-day anyways

## 2018-03-02 NOTE — Telephone Encounter (Signed)
On pts med list it has directions one 4 times daily PRN for pain. Should the direction stay PRN or daily. Please advise. Thank you

## 2018-03-02 NOTE — Telephone Encounter (Signed)
She may have 120 pills, 1 taken 4 times daily, Percocet 10 mg/325, the date for these should be July 13, she should keep her appointment on the 17th

## 2018-03-02 NOTE — Telephone Encounter (Signed)
Pt returned call. Informed her that the script would be up front waiting. Pt verbalized understanding.

## 2018-03-02 NOTE — Telephone Encounter (Signed)
Left message to return call.  Script printed and awaiting signature.

## 2018-03-07 ENCOUNTER — Ambulatory Visit: Payer: BLUE CROSS/BLUE SHIELD | Admitting: Family Medicine

## 2018-03-16 ENCOUNTER — Encounter: Payer: Self-pay | Admitting: Family Medicine

## 2018-03-16 ENCOUNTER — Ambulatory Visit (INDEPENDENT_AMBULATORY_CARE_PROVIDER_SITE_OTHER): Payer: BLUE CROSS/BLUE SHIELD | Admitting: Family Medicine

## 2018-03-16 VITALS — BP 132/84 | Ht 64.0 in | Wt 137.8 lb

## 2018-03-16 DIAGNOSIS — M542 Cervicalgia: Secondary | ICD-10-CM

## 2018-03-16 DIAGNOSIS — M5440 Lumbago with sciatica, unspecified side: Secondary | ICD-10-CM

## 2018-03-16 DIAGNOSIS — G8929 Other chronic pain: Secondary | ICD-10-CM

## 2018-03-16 DIAGNOSIS — R413 Other amnesia: Secondary | ICD-10-CM | POA: Diagnosis not present

## 2018-03-16 MED ORDER — OXYCODONE-ACETAMINOPHEN 10-325 MG PO TABS: ORAL_TABLET | ORAL | 0 refills | Status: DC

## 2018-03-16 MED ORDER — AMPHETAMINE-DEXTROAMPHET ER 25 MG PO CP24: ORAL_CAPSULE | ORAL | 0 refills | Status: DC

## 2018-03-16 NOTE — Progress Notes (Signed)
Subjective:    Patient ID: Cheryl Rowe, female    DOB: 05/08/1961, 57 y.o.   MRN: [id number]  HPI This patient was seen today for chronic pain. Takes for back pain.   The medication list was reviewed and updated.   -Compliance with medication:   - Number patient states they take daily: 4 a day  -when was the last dose patient took? today  The patient was advised the importance of maintaining medication and not using illegal substances with these.  Here for refills and follow up  The patient was educated that we can provide 3 monthly scripts for their medication, it is their responsibility to follow the instructions.  Side effects or complications from medications: none  Patient is aware that pain medications are meant to minimize the severity of the pain to allow their pain levels to improve to allow for better function. They are aware of that pain medications cannot totally remove their pain.  Due for UDT ( at least once per year) : last one April 22, 2017  Having pain on left side of neck. Started 3 months ago.  Getting worse. Now its constant.   Crick oin neck on the left Now constant Not into the arm Going to try ice    Review of Systems  Constitutional: Negative for activity change and appetite change.  HENT: Negative for congestion and rhinorrhea.   Respiratory: Negative for cough and shortness of breath.   Cardiovascular: Negative for chest pain and leg swelling.  Gastrointestinal: Negative for abdominal pain, diarrhea and vomiting.  Skin: Negative for color change.  Neurological: Negative for weakness.  Psychiatric/Behavioral: Negative for confusion.       Objective:   Physical Exam  Constitutional: She appears well-nourished. No distress.  HENT:  Head: Normocephalic.  Cardiovascular: Normal rate, regular rhythm and normal heart sounds.  No murmur heard. Pulmonary/Chest: Effort normal and breath sounds normal.  Musculoskeletal: She exhibits no  edema.  Lymphadenopathy:    She has no cervical adenopathy.  Neurological: She is alert.  Psychiatric: Her behavior is normal.  Vitals reviewed.    25 minutes was spent with the patient.  This statement verifies that 25 minutes was indeed spent with the patient.  More than 50% of this visit-total duration of the visit-was spent in counseling and coordination of care. The issues that the patient came in for today as reflected in the diagnosis (s) please refer to documentation for further details. Discussion held regarding this area is not necessary for this patient to go to     Assessment & Plan:  Cervical neck pain-recommend x-rays of this more than likely degenerative disc disease could be arthritis gentle stretches continue current pain medication  Adult ADD doing well on medication continue current medicines.  Doing well on medicine drug registry was checked  Chronic pain-gets adequate control with 4 tablets a day The patient was seen in followup for chronic pain. A review over at their current pain status was discussed. Drug registry was checked. Prescriptions were given. Discussion was held regarding the importance of compliance with medication as well as pain medication contract.  Time for questions regarding pain management plan occurred. Importance of regular followup visits was discussed. Patient was informed that medication may cause drowsiness and should not be combined  with other medications/alcohol or street drugs. Patient was cautioned that medication could cause drowsiness. If the patient feels medication is causing altered alertness then do not drive or operate dangerous equipment.  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

## 2018-03-16 NOTE — Patient Instructions (Signed)
Talk with Dr Ellene Route regarding TENS stimulator

## 2018-03-17 NOTE — Progress Notes (Signed)
I spoke with the pt and she is aware that we have placed an order in epic and she can go anytime for this.

## 2018-03-17 NOTE — Patient Instructions (Signed)
Cheryl Rowe  03/17/2018     @PREFPERIOPPHARMACY @   Your procedure is scheduled on 03/25/2018.  Report to [facility] at 8:00 A.M.  Call this number if you have problems the morning of surgery:  [phone]   Remember:  Do not eat or drink after midnight. FOLLOW ALL INSTRUCTIONS GIVEN TO YOU BY DR [facility] OFFICE AND CONTACT THEM WITH ANY  QUESTIONS CONCERNING PREP.      Take these medicines the morning of surgery with A SIP OF WATER Adderall, Klonopin, Prilosec, Percocet, Lidoderm patch, Venlafaxine    Do not wear jewelry, make-up or nail polish.  Do not wear lotions, powders, or perfumes, or deodorant.  Do not shave 48 hours prior to surgery.  Men may shave face and neck.  Do not bring valuables to the hospital.  [facility] is not responsible for any belongings or valuables.  Contacts, dentures or bridgework may not be worn into surgery.  Leave your suitcase in the car.  After surgery it may be brought to your room.  For patients admitted to the hospital, discharge time will be determined by your treatment team.  Patients discharged the day of surgery will not be allowed to drive home.    Please read over the following fact sheets that you were given. Anesthesia Post-op Instructions     PATIENT INSTRUCTIONS POST-ANESTHESIA  IMMEDIATELY FOLLOWING SURGERY:  Do not drive or operate machinery for the first twenty four hours after surgery.  Do not make any important decisions for twenty four hours after surgery or while taking narcotic pain medications or sedatives.  If you develop intractable nausea and vomiting or a severe headache please notify your doctor immediately.  FOLLOW-UP:  Please make an appointment with your surgeon as instructed. You do not need to follow up with anesthesia unless specifically instructed to do so.  WOUND CARE INSTRUCTIONS (if applicable):  Keep a dry clean dressing on the anesthesia/puncture wound site if there is drainage.  Once the wound  has quit draining you may leave it open to air.  Generally you should leave the bandage intact for twenty four hours unless there is drainage.  If the epidural site drains for more than 36-48 hours please call the anesthesia department.  QUESTIONS?:  Please feel free to call your physician or the hospital operator if you have any questions, and they will be happy to assist you.      Colonoscopy, Adult A colonoscopy is an exam to look at the entire large intestine. During the exam, a lubricated, bendable tube is inserted into the anus and then passed into the rectum, colon, and other parts of the large intestine. A colonoscopy is often done as a part of normal colorectal screening or in response to certain symptoms, such as anemia, persistent diarrhea, abdominal pain, and blood in the stool. The exam can help screen for and diagnose medical problems, including:  Tumors.  Polyps.  Inflammation.  Areas of bleeding.  Tell a health care provider about:  Any allergies you have.  All medicines you are taking, including vitamins, herbs, eye drops, creams, and over-the-counter medicines.  Any problems you or family members have had with anesthetic medicines.  Any blood disorders you have.  Any surgeries you have had.  Any medical conditions you have.  Any problems you have had passing stool. What are the risks? Generally, this is a safe procedure. However, problems may occur, including:  Bleeding.  A tear in the intestine.  A reaction  to medicines given during the exam.  Infection (rare).  What happens before the procedure? Eating and drinking restrictions Follow instructions from your health care provider about eating and drinking, which may include:  A few days before the procedure - follow a low-fiber diet. Avoid nuts, seeds, dried fruit, raw fruits, and vegetables.  1-3 days before the procedure - follow a clear liquid diet. Drink only clear liquids, such as clear broth or  bouillon, black coffee or tea, clear juice, clear soft drinks or sports drinks, gelatin dessert, and popsicles. Avoid any liquids that contain red or purple dye.  On the day of the procedure - do not eat or drink anything during the 2 hours before the procedure, or within the time period that your health care provider recommends.  Bowel prep If you were prescribed an oral bowel prep to clean out your colon:  Take it as told by your health care provider. Starting the day before your procedure, you will need to drink a large amount of medicated liquid. The liquid will cause you to have multiple loose stools until your stool is almost clear or light green.  If your skin or anus gets irritated from diarrhea, you may use these to relieve the irritation: ? Medicated wipes, such as adult wet wipes with aloe and vitamin E. ? A skin soothing-product like petroleum jelly.  If you vomit while drinking the bowel prep, take a break for up to 60 minutes and then begin the bowel prep again. If vomiting continues and you cannot take the bowel prep without vomiting, call your health care provider.  General instructions  Ask your health care provider about changing or stopping your regular medicines. This is especially important if you are taking diabetes medicines or blood thinners.  Plan to have someone take you home from the hospital or clinic. What happens during the procedure?  An IV tube may be inserted into one of your veins.  You will be given medicine to help you relax (sedative).  To reduce your risk of infection: ? Your health care team will wash or sanitize their hands. ? Your anal area will be washed with soap.  You will be asked to lie on your side with your knees bent.  Your health care provider will lubricate a long, thin, flexible tube. The tube will have a camera and a light on the end.  The tube will be inserted into your anus.  The tube will be gently eased through your rectum  and colon.  Air will be delivered into your colon to keep it open. You may feel some pressure or cramping.  The camera will be used to take images during the procedure.  A small tissue sample may be removed from your body to be examined under a microscope (biopsy). If any potential problems are found, the tissue will be sent to a lab for testing.  If small polyps are found, your health care provider may remove them and have them checked for cancer cells.  The tube that was inserted into your anus will be slowly removed. The procedure may vary among health care providers and hospitals. What happens after the procedure?  Your blood pressure, heart rate, breathing rate, and blood oxygen level will be monitored until the medicines you were given have worn off.  Do not drive for 24 hours after the exam.  You may have a small amount of blood in your stool.  You may pass gas and have mild abdominal  cramping or bloating due to the air that was used to inflate your colon during the exam.  It is up to you to get the results of your procedure. Ask your health care provider, or the department performing the procedure, when your results will be ready. This information is not intended to replace advice given to you by your health care provider. Make sure you discuss any questions you have with your health care provider. Document Released: 08/14/2000 Document Revised: 06/17/2016 Document Reviewed: 10/29/2015 Elsevier Interactive Patient Education  2018 [employer].

## 2018-03-17 NOTE — Addendum Note (Signed)
Addended by: Karle Barr on: 03/17/2018 08:56 AM   Modules accepted: Orders

## 2018-03-18 ENCOUNTER — Encounter (HOSPITAL_COMMUNITY)
Admission: RE | Admit: 2018-03-18 | Discharge: 2018-03-18 | Disposition: A | Discharge status: Home / Self Care | Payer: BLUE CROSS/BLUE SHIELD | Source: Ambulatory Visit | Attending: Internal Medicine | Admitting: Internal Medicine

## 2018-03-21 NOTE — Patient Instructions (Signed)
Cheryl Rowe  03/21/2018     @PREFPERIOPPHARMACY @   Your procedure is scheduled on 03/25/2018.  Report to [facility] at 8:00 A.M.  Call this number if you have problems the morning of surgery:  [phone]   Remember:  Do not eat or drink after midnight. (Follow instructions given to you by office.  Please contact them with any questions you may have about prep)      Take these medicines the morning of surgery with A SIP OF WATER Adderall, Klonopin, Prilosec    Do not wear jewelry, make-up or nail polish.  Do not wear lotions, powders, or perfumes  Do not bring valuables to the hospital.  [facility] is not responsible for any belongings or valuables.  Contacts, dentures or bridgework may not be worn into surgery.  Leave your suitcase in the car.  After surgery it may be brought to your room.  For patients admitted to the hospital, discharge time will be determined by your treatment team.  Patients discharged the day of surgery will not be allowed to drive or make legal decisions for 24 hours post procedure.    Please read over the following fact sheets that you were given. Anesthesia Post-op Instructions   PATIENT INSTRUCTIONS POST-ANESTHESIA  IMMEDIATELY FOLLOWING SURGERY:  Do not drive or operate machinery for the first twenty four hours after surgery.  Do not make any important decisions for twenty four hours after surgery or while taking narcotic pain medications or sedatives.  If you develop intractable nausea and vomiting or a severe headache please notify your doctor immediately.  FOLLOW-UP:  Please make an appointment with your surgeon as instructed. You do not need to follow up with anesthesia unless specifically instructed to do so.  WOUND CARE INSTRUCTIONS (if applicable):  Keep a dry clean dressing on the anesthesia/puncture wound site if there is drainage.  Once the wound has quit draining you may leave it open to air.  Generally you should leave the bandage  intact for twenty four hours unless there is drainage.  If the epidural site drains for more than 36-48 hours please call the anesthesia department.  QUESTIONS?:  Please feel free to call your physician or the hospital operator if you have any questions, and they will be happy to assist you.         Colonoscopy, Adult A colonoscopy is an exam to look at the entire large intestine. During the exam, a lubricated, bendable tube is inserted into the anus and then passed into the rectum, colon, and other parts of the large intestine. A colonoscopy is often done as a part of normal colorectal screening or in response to certain symptoms, such as anemia, persistent diarrhea, abdominal pain, and blood in the stool. The exam can help screen for and diagnose medical problems, including:  Tumors.  Polyps.  Inflammation.  Areas of bleeding.  Tell a health care provider about:  Any allergies you have.  All medicines you are taking, including vitamins, herbs, eye drops, creams, and over-the-counter medicines.  Any problems you or family members have had with anesthetic medicines.  Any blood disorders you have.  Any surgeries you have had.  Any medical conditions you have.  Any problems you have had passing stool. What are the risks? Generally, this is a safe procedure. However, problems may occur, including:  Bleeding.  A tear in the intestine.  A reaction to medicines given during the exam.  Infection (rare).  What happens before  the procedure? Eating and drinking restrictions Follow instructions from your health care provider about eating and drinking, which may include:  A few days before the procedure - follow a low-fiber diet. Avoid nuts, seeds, dried fruit, raw fruits, and vegetables.  1-3 days before the procedure - follow a clear liquid diet. Drink only clear liquids, such as clear broth or bouillon, black coffee or tea, clear juice, clear soft drinks or sports drinks,  gelatin dessert, and popsicles. Avoid any liquids that contain red or purple dye.  On the day of the procedure - do not eat or drink anything during the 2 hours before the procedure, or within the time period that your health care provider recommends.  Bowel prep If you were prescribed an oral bowel prep to clean out your colon:  Take it as told by your health care provider. Starting the day before your procedure, you will need to drink a large amount of medicated liquid. The liquid will cause you to have multiple loose stools until your stool is almost clear or light green.  If your skin or anus gets irritated from diarrhea, you may use these to relieve the irritation: ? Medicated wipes, such as adult wet wipes with aloe and vitamin E. ? A skin soothing-product like petroleum jelly.  If you vomit while drinking the bowel prep, take a break for up to 60 minutes and then begin the bowel prep again. If vomiting continues and you cannot take the bowel prep without vomiting, call your health care provider.  General instructions  Ask your health care provider about changing or stopping your regular medicines. This is especially important if you are taking diabetes medicines or blood thinners.  Plan to have someone take you home from the hospital or clinic. What happens during the procedure?  An IV tube may be inserted into one of your veins.  You will be given medicine to help you relax (sedative).  To reduce your risk of infection: ? Your health care team will wash or sanitize their hands. ? Your anal area will be washed with soap.  You will be asked to lie on your side with your knees bent.  Your health care provider will lubricate a long, thin, flexible tube. The tube will have a camera and a light on the end.  The tube will be inserted into your anus.  The tube will be gently eased through your rectum and colon.  Air will be delivered into your colon to keep it open. You may feel  some pressure or cramping.  The camera will be used to take images during the procedure.  A small tissue sample may be removed from your body to be examined under a microscope (biopsy). If any potential problems are found, the tissue will be sent to a lab for testing.  If small polyps are found, your health care provider may remove them and have them checked for cancer cells.  The tube that was inserted into your anus will be slowly removed. The procedure may vary among health care providers and hospitals. What happens after the procedure?  Your blood pressure, heart rate, breathing rate, and blood oxygen level will be monitored until the medicines you were given have worn off.  Do not drive for 24 hours after the exam.  You may have a small amount of blood in your stool.  You may pass gas and have mild abdominal cramping or bloating due to the air that was used to inflate your  colon during the exam.  It is up to you to get the results of your procedure. Ask your health care provider, or the department performing the procedure, when your results will be ready. This information is not intended to replace advice given to you by your health care provider. Make sure you discuss any questions you have with your health care provider. Document Released: 08/14/2000 Document Revised: 06/17/2016 Document Reviewed: 10/29/2015 Elsevier Interactive Patient Education  2018 [employer].

## 2018-03-22 ENCOUNTER — Ambulatory Visit (HOSPITAL_COMMUNITY)
Admission: RE | Admit: 2018-03-22 | Discharge: 2018-03-22 | Disposition: A | Discharge status: Home / Self Care | Payer: BLUE CROSS/BLUE SHIELD | Source: Ambulatory Visit | Attending: Family Medicine | Admitting: Family Medicine

## 2018-03-22 ENCOUNTER — Encounter (HOSPITAL_COMMUNITY)
Admission: RE | Admit: 2018-03-22 | Discharge: 2018-03-22 | Disposition: A | Discharge status: Home / Self Care | Payer: BLUE CROSS/BLUE SHIELD | Source: Ambulatory Visit | Attending: Internal Medicine | Admitting: Internal Medicine

## 2018-03-22 ENCOUNTER — Encounter (HOSPITAL_COMMUNITY): Payer: Self-pay

## 2018-03-22 ENCOUNTER — Other Ambulatory Visit: Payer: Self-pay

## 2018-03-22 DIAGNOSIS — R413 Other amnesia: Secondary | ICD-10-CM | POA: Diagnosis not present

## 2018-03-22 DIAGNOSIS — M542 Cervicalgia: Secondary | ICD-10-CM | POA: Insufficient documentation

## 2018-03-22 DIAGNOSIS — Z01812 Encounter for preprocedural laboratory examination: Secondary | ICD-10-CM | POA: Diagnosis present

## 2018-03-22 DIAGNOSIS — Z1211 Encounter for screening for malignant neoplasm of colon: Secondary | ICD-10-CM | POA: Diagnosis not present

## 2018-03-22 DIAGNOSIS — G8929 Other chronic pain: Secondary | ICD-10-CM | POA: Diagnosis present

## 2018-03-22 DIAGNOSIS — M5440 Lumbago with sciatica, unspecified side: Secondary | ICD-10-CM | POA: Diagnosis not present

## 2018-03-22 HISTORY — DX: Unspecified osteoarthritis, unspecified site: M19.90

## 2018-03-22 LAB — CBC
HCT: 41.3 % (ref 36.0–46.0)
Hemoglobin: 13.5 g/dL (ref 12.0–15.0)
MCH: 29.9 pg (ref 26.0–34.0)
MCHC: 32.7 g/dL (ref 30.0–36.0)
MCV: 91.6 fL (ref 78.0–100.0)
Platelets: 270 10*3/uL (ref 150–400)
RBC: 4.51 MIL/uL (ref 3.87–5.11)
RDW: 13 % (ref 11.5–15.5)
WBC: 4.3 10*3/uL (ref 4.0–10.5)

## 2018-03-22 LAB — BASIC METABOLIC PANEL
Anion gap: 7 (ref 5–15)
BUN: 15 mg/dL (ref 6–20)
CO2: 27 mmol/L (ref 22–32)
Calcium: 10.1 mg/dL (ref 8.9–10.3)
Chloride: 106 mmol/L (ref 98–111)
Creatinine, Ser: 0.64 mg/dL (ref 0.44–1.00)
GFR calc Af Amer: >60 mL/min (ref >60)
GFR calc non Af Amer: >60 mL/min (ref >60)
Glucose, Bld: 88 mg/dL (ref 70–99)
Potassium: 3.8 mmol/L (ref 3.5–5.1)
Sodium: 140 mmol/L (ref 135–145)

## 2018-03-23 ENCOUNTER — Encounter: Payer: Self-pay | Admitting: Family Medicine

## 2018-03-23 MED ORDER — LISINOPRIL 2.5 MG PO TABS: 2.5000 mg | ORAL_TABLET | Freq: Every day | ORAL | 1 refills | Status: DC

## 2018-03-23 NOTE — Telephone Encounter (Signed)
I would recommend patient restart lisinopril 2.5 mg daily, re-prescribe this please #90 with 1 refill I recommend follow blood pressures intermittently if elevated readings are persisting then I recommend a follow-up office visit

## 2018-03-23 NOTE — Addendum Note (Signed)
Addended by: Dairl Ponder on: 03/23/2018 10:21 AM   Modules accepted: Orders

## 2018-03-25 ENCOUNTER — Ambulatory Visit (HOSPITAL_COMMUNITY): Payer: BLUE CROSS/BLUE SHIELD | Admitting: Anesthesiology

## 2018-03-25 ENCOUNTER — Encounter (HOSPITAL_COMMUNITY): Payer: Self-pay | Admitting: *Deleted

## 2018-03-25 ENCOUNTER — Encounter (HOSPITAL_COMMUNITY)
Admission: RE | Disposition: A | Discharge status: Home / Self Care | Payer: Self-pay | Source: Ambulatory Visit | Attending: Internal Medicine

## 2018-03-25 ENCOUNTER — Ambulatory Visit (HOSPITAL_COMMUNITY)
Admission: RE | Admit: 2018-03-25 | Discharge: 2018-03-25 | Disposition: A | Discharge status: Home / Self Care | Payer: BLUE CROSS/BLUE SHIELD | Source: Ambulatory Visit | Attending: Internal Medicine | Admitting: Internal Medicine

## 2018-03-25 ENCOUNTER — Other Ambulatory Visit: Payer: Self-pay

## 2018-03-25 DIAGNOSIS — K219 Gastro-esophageal reflux disease without esophagitis: Secondary | ICD-10-CM | POA: Diagnosis not present

## 2018-03-25 DIAGNOSIS — E785 Hyperlipidemia, unspecified: Secondary | ICD-10-CM | POA: Diagnosis not present

## 2018-03-25 DIAGNOSIS — K644 Residual hemorrhoidal skin tags: Secondary | ICD-10-CM

## 2018-03-25 DIAGNOSIS — K5730 Diverticulosis of large intestine without perforation or abscess without bleeding: Secondary | ICD-10-CM | POA: Insufficient documentation

## 2018-03-25 DIAGNOSIS — M4310 Spondylolisthesis, site unspecified: Secondary | ICD-10-CM | POA: Insufficient documentation

## 2018-03-25 DIAGNOSIS — F909 Attention-deficit hyperactivity disorder, unspecified type: Secondary | ICD-10-CM | POA: Insufficient documentation

## 2018-03-25 DIAGNOSIS — Z9049 Acquired absence of other specified parts of digestive tract: Secondary | ICD-10-CM | POA: Diagnosis not present

## 2018-03-25 DIAGNOSIS — Z8261 Family history of arthritis: Secondary | ICD-10-CM | POA: Insufficient documentation

## 2018-03-25 DIAGNOSIS — M419 Scoliosis, unspecified: Secondary | ICD-10-CM | POA: Insufficient documentation

## 2018-03-25 DIAGNOSIS — Z8249 Family history of ischemic heart disease and other diseases of the circulatory system: Secondary | ICD-10-CM | POA: Insufficient documentation

## 2018-03-25 DIAGNOSIS — Z888 Allergy status to other drugs, medicaments and biological substances status: Secondary | ICD-10-CM | POA: Diagnosis not present

## 2018-03-25 DIAGNOSIS — F329 Major depressive disorder, single episode, unspecified: Secondary | ICD-10-CM | POA: Insufficient documentation

## 2018-03-25 DIAGNOSIS — Z79899 Other long term (current) drug therapy: Secondary | ICD-10-CM | POA: Diagnosis not present

## 2018-03-25 DIAGNOSIS — D123 Benign neoplasm of transverse colon: Secondary | ICD-10-CM | POA: Insufficient documentation

## 2018-03-25 DIAGNOSIS — M1990 Unspecified osteoarthritis, unspecified site: Secondary | ICD-10-CM | POA: Diagnosis not present

## 2018-03-25 DIAGNOSIS — G43909 Migraine, unspecified, not intractable, without status migrainosus: Secondary | ICD-10-CM | POA: Insufficient documentation

## 2018-03-25 DIAGNOSIS — Z1211 Encounter for screening for malignant neoplasm of colon: Secondary | ICD-10-CM | POA: Insufficient documentation

## 2018-03-25 HISTORY — PX: POLYPECTOMY: SHX5525

## 2018-03-25 HISTORY — PX: COLONOSCOPY WITH PROPOFOL: SHX5780

## 2018-03-25 SURGERY — COLONOSCOPY WITH PROPOFOL
Anesthesia: Monitor Anesthesia Care

## 2018-03-25 MED ORDER — PROPOFOL 10 MG/ML IV BOLUS
INTRAVENOUS | Status: DC | PRN
  Administered 2018-03-25: 20 mg via INTRAVENOUS
  Administered 2018-03-25: 30 mg via INTRAVENOUS
  Administered 2018-03-25: 40 mg via INTRAVENOUS

## 2018-03-25 MED ORDER — EPHEDRINE SULFATE 50 MG/ML IJ SOLN
INTRAMUSCULAR | Status: DC | PRN
  Administered 2018-03-25: 10 mg via INTRAVENOUS

## 2018-03-25 MED ORDER — CHLORHEXIDINE GLUCONATE CLOTH 2 % EX PADS: 6.0000 | MEDICATED_PAD | Freq: Once | CUTANEOUS | Status: DC

## 2018-03-25 MED ORDER — MIDAZOLAM HCL 5 MG/5ML IJ SOLN
INTRAMUSCULAR | Status: DC | PRN
  Administered 2018-03-25: 2 mg via INTRAVENOUS

## 2018-03-25 MED ORDER — PROPOFOL 500 MG/50ML IV EMUL
INTRAVENOUS | Status: DC | PRN
  Administered 2018-03-25: 150 ug/kg/min via INTRAVENOUS
  Administered 2018-03-25: 10:00:00 via INTRAVENOUS

## 2018-03-25 MED ORDER — LACTATED RINGERS IV SOLN
INTRAVENOUS | Status: DC
  Administered 2018-03-25: 09:00:00 via INTRAVENOUS

## 2018-03-25 MED ORDER — PROPOFOL 10 MG/ML IV BOLUS
INTRAVENOUS | Status: AC
  Filled 2018-03-25: qty 40

## 2018-03-25 MED ORDER — MIDAZOLAM HCL 2 MG/2ML IJ SOLN
INTRAMUSCULAR | Status: AC
  Filled 2018-03-25: qty 2

## 2018-03-25 NOTE — H&P (Signed)
Cheryl Rowe is an 57 y.o. female.   Chief Complaint: Patient is here for colonoscopy. HPI: Patient is 57 year old Caucasian female who is here for screening colonoscopy.  She denies abdominal pain change in bowel habits or rectal bleeding.  Last colonoscopy was about 10 years ago when she had acute colitis. Patient does not take aspirin or NSAIDs. Family history is negative for IBD or CRC.   Past Medical History:  Diagnosis Date  . ADHD (attention deficit hyperactivity disorder)   . Arthritis   . Depression   . Family history of adverse reaction to anesthesia    sister has PONV  . GERD (gastroesophageal reflux disease)   . Hyperlipidemia   . Migraine   . Scoliosis    Teenage  . Spondylolisthesis of lumbosacral region   . Wears glasses     Past Surgical History:  Procedure Laterality Date  . CERVICAL ABLATION    . CHOLECYSTECTOMY    . TONSILLECTOMY      Family History  Problem Relation Age of Onset  . Hypertension Father   . Arthritis Mother    Social History:  reports that she has never smoked. She has never used smokeless tobacco. She reports that she does not drink alcohol or use drugs.  Allergies:  Allergies  Allergen Reactions  . Pravastatin     Legs ache  . Cymbalta [Duloxetine Hcl] Other (See Comments)    Aggressive     Medications Prior to Admission  Medication Sig Dispense Refill  . acetaminophen (TYLENOL) 500 MG tablet Take 500 mg by mouth 2 (two) times daily as needed for headache.    . alendronate (FOSAMAX) 70 MG tablet Take 1 tablet (70 mg total) by mouth once a week. Take with a full glass of water on an empty stomach. 4 tablet 6  . amphetamine-dextroamphetamine (ADDERALL XR) 25 MG 24 hr capsule Take 2 tablets daily 60 capsule 0  . clonazePAM (KLONOPIN) 0.5 MG tablet Max 1/2 bid prn (Patient taking differently: Take 0.25 mg by mouth daily with lunch. ) 30 tablet 5  . EVENING PRIMROSE OIL PO Take 1,300 mg by mouth at bedtime.    . Flaxseed,  Linseed, (FLAX SEED OIL) 1000 MG CAPS Take 1,000 mg by mouth daily.    Marland Kitchen lidocaine (LIDODERM) 5 % Place 0.5 patches onto the skin daily as needed (pain). Remove & Discard patch within 12 hours or as directed by MD    . lisinopril (ZESTRIL) 2.5 MG tablet Take 1 tablet (2.5 mg total) by mouth daily. 90 tablet 1  . Multiple Vitamin (MULTIVITAMIN WITH MINERALS) TABS tablet Take 1 tablet by mouth daily.    Marland Kitchen omeprazole (PRILOSEC) 40 MG capsule Take 1 capsule (40 mg total) by mouth daily. 90 capsule 3  . oxyCODONE-acetaminophen (PERCOCET) 10-325 MG tablet One 4 times daily PRN pain 120 tablet 0  . SUPER B COMPLEX/C PO Take 1 tablet by mouth daily.    . traZODone (DESYREL) 100 MG tablet Take 1 tablet (100 mg total) by mouth at bedtime. 90 tablet 3  . Venlafaxine HCl 75 MG TB24 TAKE THREE TABLETS BY MOUTH AT NIGHT (Patient taking differently: Take 150-225 mg by mouth See admin instructions. Take 150 mg at night for 2 nights then take 225 mg the 3rd night then repeat) 270 tablet 2  . vitamin E 400 UNIT capsule Take 400 Units by mouth daily.    . Wheat Dextrin (BENEFIBER) POWD Take 1 scoop by mouth at bedtime.  No results found for this or any previous visit (from the past 48 hour(s)). No results found.  ROS  Blood pressure 124/76, pulse (!) 57, temperature 98.6 F (37 C), resp. rate 15, SpO2 98 %. Physical Exam  Constitutional: She appears well-developed and well-nourished.  HENT:  Mouth/Throat: Oropharynx is clear and moist.  Eyes: Conjunctivae are normal. No scleral icterus.  Neck: No thyromegaly present.  Cardiovascular: Normal rate, regular rhythm and normal heart sounds.  No murmur heard. Respiratory: Effort normal and breath sounds normal.  GI: Soft. She exhibits no distension and no mass. There is no tenderness.  Musculoskeletal: She exhibits no edema.  Neurological: She is alert.  Skin: Skin is warm and dry.     Assessment/Plan Average or screening colonoscopy.  Hildred Laser, MD 03/25/2018, 8:26 AM

## 2018-03-25 NOTE — Discharge Instructions (Signed)
No aspirin or NSAIDs for 24 hours. Resume usual medications as before. High-fiber diet. No driving for 24 hours. Physician will call with biopsy results.      Colonoscopy, Adult, Care After This sheet gives you information about how to care for yourself after your procedure. Your doctor may also give you more specific instructions. If you have problems or questions, call your doctor. Follow these instructions at home: General instructions   For the first 24 hours after the procedure: ? Do not drive or use machinery. ? Do not sign important documents. ? Do not drink alcohol. ? Do your daily activities more slowly than normal. ? Eat foods that are soft and easy to digest. ? Rest often.  Take over-the-counter or prescription medicines only as told by your doctor.  It is up to you to get the results of your procedure. Ask your doctor, or the department performing the procedure, when your results will be ready. To help cramping and bloating:  Try walking around.  Put heat on your belly (abdomen) as told by your doctor. Use a heat source that your doctor recommends, such as a moist heat pack or a heating pad. ? Put a towel between your skin and the heat source. ? Leave the heat on for 20-30 minutes. ? Remove the heat if your skin turns bright red. This is especially important if you cannot feel pain, heat, or cold. You can get burned. Eating and drinking  Drink enough fluid to keep your pee (urine) clear or pale yellow.  Return to your normal diet as told by your doctor. Avoid heavy or fried foods that are hard to digest.  Avoid drinking alcohol for as long as told by your doctor. Contact a doctor if:  You have blood in your poop (stool) 2-3 days after the procedure. Get help right away if:  You have more than a small amount of blood in your poop.  You see large clumps of tissue (blood clots) in your poop.  Your belly is swollen.  You feel sick to your stomach  (nauseous).  You throw up (vomit).  You have a fever.  You have belly pain that gets worse, and medicine does not help your pain. This information is not intended to replace advice given to you by your health care provider. Make sure you discuss any questions you have with your health care provider. Document Released: 09/19/2010 Document Revised: 05/11/2016 Document Reviewed: 05/11/2016 Elsevier Interactive Patient Education  2017 [place].    Colon Polyps Polyps are tissue growths inside the body. Polyps can grow in many places, including the large intestine (colon). A polyp may be a round bump or a mushroom-shaped growth. You could have one polyp or several. Most colon polyps are noncancerous (benign). However, some colon polyps can become cancerous over time. What are the causes? The exact cause of colon polyps is not known. What increases the risk? This condition is more likely to develop in people who:  Have a family history of colon cancer or colon polyps.  Are older than 56 or older than 45 if they are African American.  Have inflammatory bowel disease, such as ulcerative colitis or Crohn disease.  Are overweight.  Smoke cigarettes.  Do not get enough exercise.  Drink too much alcohol.  Eat a diet that is: ? High in fat and red meat. ? Low in fiber.  Had childhood cancer that was treated with abdominal radiation.  What are the signs or symptoms? Most  polyps do not cause symptoms. If you have symptoms, they may include:  Blood coming from your rectum when having a bowel movement.  Blood in your stool.The stool may look dark red or black.  A change in bowel habits, such as constipation or diarrhea.  How is this diagnosed? This condition is diagnosed with a colonoscopy. This is a procedure that uses a lighted, flexible scope to look at the inside of your colon. How is this treated? Treatment for this condition involves removing any polyps that are found.  Those polyps will then be tested for cancer. If cancer is found, your health care provider will talk to you about options for colon cancer treatment. Follow these instructions at home: Diet  Eat plenty of fiber, such as fruits, vegetables, and whole grains.  Eat foods that are high in calcium and vitamin D, such as milk, cheese, yogurt, eggs, liver, fish, and broccoli.  Limit foods high in fat, red meats, and processed meats, such as hot dogs, sausage, bacon, and lunch meats.  Maintain a healthy weight, or lose weight if recommended by your health care provider. General instructions  Do not smoke cigarettes.  Do not drink alcohol excessively.  Keep all follow-up visits as told by your health care provider. This is important. This includes keeping regularly scheduled colonoscopies. Talk to your health care provider about when you need a colonoscopy.  Exercise every day or as told by your health care provider. Contact a health care provider if:  You have new or worsening bleeding during a bowel movement.  You have new or increased blood in your stool.  You have a change in bowel habits.  You unexpectedly lose weight. This information is not intended to replace advice given to you by your health care provider. Make sure you discuss any questions you have with your health care provider. Document Released: 05/13/2004 Document Revised: 01/23/2016 Document Reviewed: 07/08/2015 Elsevier Interactive Patient Education  2018 [place].     High-Fiber Diet Fiber, also called dietary fiber, is a type of carbohydrate found in fruits, vegetables, whole grains, and beans. A high-fiber diet can have many health benefits. Your health care provider may recommend a high-fiber diet to help:  Prevent constipation. Fiber can make your bowel movements more regular.  Lower your cholesterol.  Relieve hemorrhoids, uncomplicated diverticulosis, or irritable bowel syndrome.  Prevent overeating as  part of a weight-loss plan.  Prevent heart disease, type 2 diabetes, and certain cancers.  What is my plan? The recommended daily intake of fiber includes:  38 grams for men under age 26.  35 grams for men over age 59.  61 grams for women under age 103.  62 grams for women over age 53.  You can get the recommended daily intake of dietary fiber by eating a variety of fruits, vegetables, grains, and beans. Your health care provider may also recommend a fiber supplement if it is not possible to get enough fiber through your diet. What do I need to know about a high-fiber diet?  Fiber supplements have not been widely studied for their effectiveness, so it is better to get fiber through food sources.  Always check the fiber content on thenutrition facts label of any prepackaged food. Look for foods that contain at least 5 grams of fiber per serving.  Ask your dietitian if you have questions about specific foods that are related to your condition, especially if those foods are not listed in the following section.  Increase your daily  fiber consumption gradually. Increasing your intake of dietary fiber too quickly may cause bloating, cramping, or gas.  Drink plenty of water. Water helps you to digest fiber. What foods can I eat? Grains Whole-grain breads. Multigrain cereal. Oats and oatmeal. Brown rice. Barley. Bulgur wheat. [place]. Bran muffins. Popcorn. Rye wafer crackers. Vegetables Sweet potatoes. Spinach. Kale. Artichokes. Cabbage. Broccoli. Green peas. Carrots. Squash. Fruits Berries. Pears. Apples. Oranges. Avocados. Prunes and raisins. Dried figs. Meats and Other Protein Sources Navy, kidney, pinto, and soy beans. Split peas. Lentils. Nuts and seeds. Dairy Fiber-fortified yogurt. Beverages Fiber-fortified soy milk. Fiber-fortified orange juice. Other Fiber bars. The items listed above may not be a complete list of recommended foods or beverages. Contact your dietitian for  more options. What foods are not recommended? Grains White bread. Pasta made with refined flour. White rice. Vegetables Fried potatoes. Canned vegetables. Well-cooked vegetables. Fruits Fruit juice. Cooked, strained fruit. Meats and Other Protein Sources Fatty cuts of meat. Fried Sales executive or fried fish. Dairy Milk. Yogurt. Cream cheese. Sour cream. Beverages Soft drinks. Other Cakes and pastries. Butter and oils. The items listed above may not be a complete list of foods and beverages to avoid. Contact your dietitian for more information. What are some tips for including high-fiber foods in my diet?  Eat a wide variety of high-fiber foods.  Make sure that half of all grains consumed each day are whole grains.  Replace breads and cereals made from refined flour or white flour with whole-grain breads and cereals.  Replace white rice with brown rice, bulgur wheat, or millet.  Start the day with a breakfast that is high in fiber, such as a cereal that contains at least 5 grams of fiber per serving.  Use beans in place of meat in soups, salads, or pasta.  Eat high-fiber snacks, such as berries, raw vegetables, nuts, or popcorn. This information is not intended to replace advice given to you by your health care provider. Make sure you discuss any questions you have with your health care provider. Document Released: 08/17/2005 Document Revised: 01/23/2016 Document Reviewed: 01/30/2014 Elsevier Interactive Patient Education  2018 [place], Care After These instructions provide you with information about caring for yourself after your procedure. Your health care provider may also give you more specific instructions. Your treatment has been planned according to current medical practices, but problems sometimes occur. Call your health care provider if you have any problems or questions after your procedure. What can I expect after the procedure? After  your procedure, it is common to:  Feel sleepy for several hours.  Feel clumsy and have poor balance for several hours.  Feel forgetful about what happened after the procedure.  Have poor judgment for several hours.  Feel nauseous or vomit.  Have a sore throat if you had a breathing tube during the procedure.  Follow these instructions at home: For at least 24 hours after the procedure:   Do not: ? Participate in activities in which you could fall or become injured. ? Drive. ? Use heavy machinery. ? Drink alcohol. ? Take sleeping pills or medicines that cause drowsiness. ? Make important decisions or sign legal documents. ? Take care of children on your own.  Rest. Eating and drinking  Follow the diet that is recommended by your health care provider.  If you vomit, drink water, juice, or soup when you can drink without vomiting.  Make sure you have little or no nausea before eating  solid foods. General instructions  Have a responsible adult stay with you until you are awake and alert.  Take over-the-counter and prescription medicines only as told by your health care provider.  If you smoke, do not smoke without supervision.  Keep all follow-up visits as told by your health care provider. This is important. Contact a health care provider if:  You keep feeling nauseous or you keep vomiting.  You feel light-headed.  You develop a rash.  You have a fever. Get help right away if:  You have trouble breathing. This information is not intended to replace advice given to you by your health care provider. Make sure you discuss any questions you have with your health care provider. Document Released: 12/08/2015 Document Revised: 04/08/2016 Document Reviewed: 12/08/2015 Elsevier Interactive Patient Education  [employer].

## 2018-03-25 NOTE — Op Note (Signed)
[facility] Patient Name: Cheryl Rowe Procedure Date: 03/25/2018 9:15 AM MRN: [id number] Date of Birth: March 29, 1961 Attending MD: Hildred Laser , MD CSN: [id number] Age: 57 Admit Type: Ambulatory Procedure:                Colonoscopy Indications:              Screening for colorectal malignant neoplasm Providers:                Hildred Laser, MD, Lurline Del, RN, Randa Spike,                            Technician Referring MD:              Medicines:                Propofol per Anesthesia Complications:            No immediate complications. Estimated Blood Loss:     Estimated blood loss was minimal. Procedure:                Pre-Anesthesia Assessment:                           - Prior to the procedure, a History and Physical                            was performed, and patient medications and                            allergies were reviewed. The patient's tolerance of                            previous anesthesia was also reviewed. The risks                            and benefits of the procedure and the sedation                            options and risks were discussed with the patient.                            All questions were answered, and informed consent                            was obtained. Prior Anticoagulants: The patient has                            taken no previous anticoagulant or antiplatelet                            agents. ASA Grade Assessment: III - A patient with                            severe systemic disease. After reviewing the risks  and benefits, the patient was deemed in                            satisfactory condition to undergo the procedure.                           After obtaining informed consent, the colonoscope                            was passed under direct vision. Throughout the                            procedure, the patient's blood pressure, pulse, and                            oxygen  saturations were monitored continuously. The                            [id number]-[id number] ([id number]) scope was introduced through                            the and advanced to the the cecum, identified by                            appendiceal orifice and ileocecal valve. The                            colonoscopy was performed without difficulty. The                            patient tolerated the procedure well. The quality                            of the bowel preparation was adequate. The                            ileocecal valve, appendiceal orifice, and rectum                            were photographed. Scope In: 9:34:04 AM Scope Out: 9:54:57 AM Scope Withdrawal Time: 0 hours 14 minutes 12 seconds  Total Procedure Duration: 0 hours 20 minutes 53 seconds  Findings:      Skin tags were found on perianal exam.      A small polyp was found in the hepatic flexure. The polyp was sessile.       The polyp was removed with a cold snare. Resection and retrieval were       complete.      A few medium-mouthed diverticula were found in the sigmoid colon.      External hemorrhoids were found during retroflexion. The hemorrhoids       were medium-sized. Impression:               - Perianal skin tags found on perianal exam.                           -  One small polyp at the hepatic flexure, removed                            with a cold snare. Resected and retrieved.                           - Diverticulosis in the sigmoid colon.                           - External hemorrhoids. Moderate Sedation:      Per Anesthesia Care Recommendation:           - Patient has a contact number available for                            emergencies. The signs and symptoms of potential                            delayed complications were discussed with the                            patient. Return to normal activities tomorrow.                            Written discharge instructions were provided to the                             patient.                           - High fiber diet today.                           - Continue present medications.                           - No aspirin, ibuprofen, naproxen, or other                            non-steroidal anti-inflammatory drugs for 1 day.                           - Await pathology results.                           - Repeat colonoscopy is recommended. The                            colonoscopy date will be determined after pathology                            results from today's exam become available for                            review. Procedure Code(s):        --- Professional ---  45385, Colonoscopy, flexible; with removal of                            tumor(s), polyp(s), or other lesion(s) by snare                            technique Diagnosis Code(s):        --- Professional ---                           K64.4, Residual hemorrhoidal skin tags                           Z12.11, Encounter for screening for malignant                            neoplasm of colon                           D12.3, Benign neoplasm of transverse colon (hepatic                            flexure or splenic flexure)                           K57.30, Diverticulosis of large intestine without                            perforation or abscess without bleeding CPT copyright 2017 American Medical Association. All rights reserved. The codes documented in this report are preliminary and upon coder review may  be revised to meet current compliance requirements. Hildred Laser, MD Hildred Laser, MD 03/25/2018 10:07:13 AM This report has been signed electronically. Number of Addenda: 0

## 2018-03-25 NOTE — Anesthesia Preprocedure Evaluation (Addendum)
Anesthesia Evaluation  Patient identified by MRN, date of birth, ID band Patient awake    Reviewed: Allergy & Precautions, H&P , NPO status , Patient's Chart, lab work & pertinent test results, reviewed documented beta blocker date and time   Airway Mallampati: II  TM Distance: >3 FB Neck ROM: full    Dental no notable dental hx.    Pulmonary neg pulmonary ROS,    Pulmonary exam normal breath sounds clear to auscultation       Cardiovascular Exercise Tolerance: Good negative cardio ROS   Rhythm:regular Rate:Normal     Neuro/Psych negative neurological ROS  negative psych ROS   GI/Hepatic negative GI ROS, Neg liver ROS, GERD  ,  Endo/Other  negative endocrine ROS  Renal/GU negative Renal ROS  negative genitourinary   Musculoskeletal   Abdominal   Peds  Hematology negative hematology ROS (+)   Anesthesia Other Findings   Reproductive/Obstetrics negative OB ROS                             Anesthesia Physical Anesthesia Plan  ASA: II  Anesthesia Plan: MAC   Post-op Pain Management:    Induction:   PONV Risk Score and Plan: Propofol infusion and TIVA  Airway Management Planned:   Additional Equipment:   Intra-op Plan:   Post-operative Plan:   Informed Consent: I have reviewed the patients History and Physical, chart, labs and discussed the procedure including the risks, benefits and alternatives for the proposed anesthesia with the patient or authorized representative who has indicated his/her understanding and acceptance.   Dental Advisory Given  Plan Discussed with: CRNA  Anesthesia Plan Comments:        Anesthesia Quick Evaluation

## 2018-03-25 NOTE — Anesthesia Postprocedure Evaluation (Signed)
Anesthesia Post Note  Patient: Cheryl Rowe  Procedure(s) Performed: COLONOSCOPY WITH PROPOFOL (N/A ) POLYPECTOMY  Patient location during evaluation: PACU Anesthesia Type: General Level of consciousness: awake and alert and oriented Pain management: pain level controlled Vital Signs Assessment: post-procedure vital signs reviewed and stable Respiratory status: spontaneous breathing Cardiovascular status: stable Postop Assessment: no apparent nausea or vomiting Anesthetic complications: no     Last Vitals:  Vitals:   03/25/18 0848 03/25/18 0908  BP: 123/73 131/72  Pulse:    Resp: 20 13  Temp:    SpO2: 100% 100%    Last Pain:  Vitals:   03/25/18 0816  TempSrc: Oral  PainSc:                  [name]

## 2018-03-25 NOTE — Transfer of Care (Signed)
Immediate Anesthesia Transfer of Care Note  Patient: Cheryl Rowe  Procedure(s) Performed: COLONOSCOPY WITH PROPOFOL (N/A ) POLYPECTOMY  Patient Location: PACU  Anesthesia Type:MAC  Level of Consciousness: awake, alert  and oriented  Airway & Oxygen Therapy: Patient Spontanous Breathing  Post-op Assessment: Report given to RN  Post vital signs: Reviewed and stable  Last Vitals:  Vitals Value Taken Time  BP 87/54 03/25/2018 10:00 AM  Temp    Pulse 72 03/25/2018 10:01 AM  Resp 16 03/25/2018 10:01 AM  SpO2 100 % 03/25/2018 10:01 AM  Vitals shown include unvalidated device data.  Last Pain:  Vitals:   03/25/18 0816  TempSrc: Oral  PainSc:       Patients Stated Pain Goal: 6 (25/75/05 1833)  Complications: No apparent anesthesia complications

## 2018-03-30 ENCOUNTER — Encounter (HOSPITAL_COMMUNITY): Payer: Self-pay | Admitting: Internal Medicine

## 2018-04-02 ENCOUNTER — Other Ambulatory Visit: Payer: Self-pay | Admitting: Family Medicine

## 2018-04-07 ENCOUNTER — Encounter: Payer: Self-pay | Admitting: Family Medicine

## 2018-04-08 ENCOUNTER — Other Ambulatory Visit: Payer: Self-pay | Admitting: Family Medicine

## 2018-04-08 MED ORDER — LIDOCAINE 5 % EX PTCH: 1.0000 | MEDICATED_PATCH | Freq: Every day | CUTANEOUS | 5 refills | Status: DC | PRN

## 2018-05-04 ENCOUNTER — Telehealth: Payer: Self-pay | Admitting: Family Medicine

## 2018-05-04 DIAGNOSIS — M810 Age-related osteoporosis without current pathological fracture: Secondary | ICD-10-CM

## 2018-05-04 NOTE — Telephone Encounter (Signed)
Fosamax is really hurting stomach.  No available appointment in the month of September for regular appts. Change meds? Work -in?

## 2018-05-04 NOTE — Telephone Encounter (Signed)
So in this particular situation when the medication is causing pain in the stomach typically what is done at that point is to utilize IV medications such as Reclast or Boniva.  Unfortunately switching to a different oral medication for osteoporosis will only cause the same amount of stomach discomfort. As for the IV medicine typically we have to see which medicine the insurance company approves and then set this up.  But knee but is a IV medication given at the specialty center at the hospital 4 times per year whereas Recalast is once a year-talk with the patient see what she would like to do it is important for her to be on medication because of her osteoporosis (putting her on additional acid blocking medicines will not help this situation what needs to be done is to stop the Fosamax and move on to the IV medicine)

## 2018-05-04 NOTE — Telephone Encounter (Signed)
Acid reflux and stomach hurts on the same day after taking fosamax. Started about one month after starting fosamax. Pt states to leave a detailed message on answering machine if she does not answer.

## 2018-05-05 NOTE — Telephone Encounter (Signed)
Discussed with pt. Pt states she would like to try the once a year med if her insurance covers.

## 2018-05-05 NOTE — Telephone Encounter (Signed)
Referral put in for aph specialty center for reclast

## 2018-05-05 NOTE — Telephone Encounter (Signed)
I agree with the management

## 2018-05-18 ENCOUNTER — Telehealth: Payer: Self-pay | Admitting: Family Medicine

## 2018-05-18 NOTE — Telephone Encounter (Signed)
This was signed thank you 

## 2018-05-18 NOTE — Telephone Encounter (Signed)
Please complete all highlighted areas, sign, & date order for Reclast so it can be sent for scheduling  In red folder in basket on wall

## 2018-05-19 NOTE — Telephone Encounter (Signed)
Order sent to [facility] for scheduling

## 2018-06-05 ENCOUNTER — Other Ambulatory Visit: Payer: Self-pay | Admitting: Family Medicine

## 2018-06-06 NOTE — Telephone Encounter (Signed)
She may have this plus four refills

## 2018-06-07 ENCOUNTER — Ambulatory Visit: Payer: BLUE CROSS/BLUE SHIELD | Admitting: Family Medicine

## 2018-06-16 ENCOUNTER — Encounter: Payer: Self-pay | Admitting: "Endocrinology

## 2018-06-20 ENCOUNTER — Ambulatory Visit: Payer: BLUE CROSS/BLUE SHIELD | Admitting: Family Medicine

## 2018-06-30 ENCOUNTER — Encounter (HOSPITAL_COMMUNITY)
Admission: RE | Admit: 2018-06-30 | Discharge: 2018-06-30 | Disposition: A | Discharge status: Home / Self Care | Payer: BLUE CROSS/BLUE SHIELD | Source: Ambulatory Visit | Attending: Family Medicine | Admitting: Family Medicine

## 2018-06-30 DIAGNOSIS — M810 Age-related osteoporosis without current pathological fracture: Secondary | ICD-10-CM | POA: Diagnosis not present

## 2018-06-30 LAB — COMPREHENSIVE METABOLIC PANEL
ALT: 40 U/L (ref 0–44)
AST: 40 U/L (ref 15–41)
Albumin: 4.2 g/dL (ref 3.5–5.0)
Alkaline Phosphatase: 97 U/L (ref 38–126)
Anion gap: 5 (ref 5–15)
BUN: 16 mg/dL (ref 6–20)
CO2: 31 mmol/L (ref 22–32)
Calcium: 10.1 mg/dL (ref 8.9–10.3)
Chloride: 103 mmol/L (ref 98–111)
Creatinine, Ser: 0.58 mg/dL (ref 0.44–1.00)
GFR calc Af Amer: >60 mL/min (ref >60)
GFR calc non Af Amer: >60 mL/min (ref >60)
Glucose, Bld: 64 mg/dL — ABNORMAL LOW (ref 70–99)
Potassium: 4.7 mmol/L (ref 3.5–5.1)
Sodium: 139 mmol/L (ref 135–145)
Total Bilirubin: 0.4 mg/dL (ref 0.3–1.2)
Total Protein: 6.8 g/dL (ref 6.5–8.1)

## 2018-06-30 MED ORDER — ZOLEDRONIC ACID 5 MG/100ML IV SOLN
5.0000 mg | Freq: Once | INTRAVENOUS | Status: AC
  Administered 2018-06-30: 5 mg via INTRAVENOUS

## 2018-06-30 NOTE — Discharge Instructions (Signed)

## 2018-07-01 ENCOUNTER — Ambulatory Visit (INDEPENDENT_AMBULATORY_CARE_PROVIDER_SITE_OTHER): Payer: BLUE CROSS/BLUE SHIELD | Admitting: Family Medicine

## 2018-07-01 ENCOUNTER — Encounter: Payer: Self-pay | Admitting: Family Medicine

## 2018-07-01 VITALS — BP 132/86 | Ht 64.0 in | Wt 152.0 lb

## 2018-07-01 DIAGNOSIS — Z1159 Encounter for screening for other viral diseases: Secondary | ICD-10-CM | POA: Diagnosis not present

## 2018-07-01 DIAGNOSIS — Z114 Encounter for screening for human immunodeficiency virus [HIV]: Secondary | ICD-10-CM

## 2018-07-01 DIAGNOSIS — Z23 Encounter for immunization: Secondary | ICD-10-CM

## 2018-07-01 DIAGNOSIS — E7849 Other hyperlipidemia: Secondary | ICD-10-CM

## 2018-07-01 DIAGNOSIS — Z79891 Long term (current) use of opiate analgesic: Secondary | ICD-10-CM | POA: Diagnosis not present

## 2018-07-01 NOTE — Progress Notes (Signed)
Subjective:    Patient ID: Cheryl Rowe, female    DOB: Nov 09, 1960, 57 y.o.   MRN: [id number]  HPI This patient was seen today for chronic pain  The medication list was reviewed and updated.   -Compliance with medication: yes  - Number patient states they take daily: 4  -when was the last dose patient took? This morning about 9:30 am   The patient was advised the importance of maintaining medication and not using illegal substances with these.  Here for refills and follow up  The patient was educated that we can provide 3 monthly scripts for their medication, it is their responsibility to follow the instructions.  Side effects or complications from medications: none  Patient is aware that pain medications are meant to minimize the severity of the pain to allow their pain levels to improve to allow for better function. They are aware of that pain medications cannot totally remove their pain.  Due for UDT ( at least once per year) : done today    Patient with significant mid back and low back pain radiates into the hips she has seen the specialist who recommended surgery she is contemplating this she uses a pain medicine 3 or 4 times per day some days she thinks she could use 5/day  She states she has worked hard at tapering off of the clonazepam.  She denies any other particular troubles currently   Review of Systems  Constitutional: Negative for activity change, appetite change and fatigue.  HENT: Negative for congestion and rhinorrhea.   Respiratory: Negative for cough and shortness of breath.   Cardiovascular: Negative for chest pain and leg swelling.  Gastrointestinal: Negative for abdominal pain and diarrhea.  Endocrine: Negative for polydipsia and polyphagia.  Musculoskeletal: Positive for arthralgias and back pain.  Skin: Negative for color change.  Neurological: Negative for dizziness and weakness.  Psychiatric/Behavioral: Negative for behavioral problems and  confusion.       Objective:   Physical Exam  Constitutional: She appears well-nourished. No distress.  HENT:  Head: Normocephalic and atraumatic.  Eyes: Right eye exhibits no discharge. Left eye exhibits no discharge.  Neck: No tracheal deviation present.  Cardiovascular: Normal rate, regular rhythm and normal heart sounds.  No murmur heard. Pulmonary/Chest: Effort normal and breath sounds normal. No respiratory distress.  Musculoskeletal: She exhibits no edema.  Lymphadenopathy:    She has no cervical adenopathy.  Neurological: She is alert. Coordination normal.  Skin: Skin is warm and dry.  Psychiatric: She has a normal mood and affect. Her behavior is normal.  Vitals reviewed.   25 minutes was spent with the patient.  This statement verifies that 25 minutes was indeed spent with the patient.  More than 50% of this visit-total duration of the visit-was spent in counseling and coordination of care. The issues that the patient came in for today as reflected in the diagnosis (s) please refer to documentation for further details.        Assessment & Plan:  The patient was seen in followup for chronic pain. A review over at their current pain status was discussed. Drug registry was checked. Prescriptions were given. Discussion was held regarding the importance of compliance with medication as well as pain medication contract.  Time for questions regarding pain management plan occurred. Importance of regular followup visits was discussed. Patient was informed that medication may cause drowsiness and should not be combined  with other medications/alcohol or street drugs. Patient was cautioned that  medication could cause drowsiness. If the patient feels medication is causing altered alertness then do not drive or operate dangerous equipment.  Hyperlipidemia check lab work await results  Screening lab work ordered  Patient contemplating back surgery she will let us know if she  ends up doing this  Patient states that she has decrease her Effexor down to 2 tablets daily  She also relates that she is tapering off of the clonazepam.

## 2018-07-02 ENCOUNTER — Other Ambulatory Visit: Payer: Self-pay | Admitting: Family Medicine

## 2018-07-03 MED ORDER — AMPHETAMINE-DEXTROAMPHET ER 25 MG PO CP24: ORAL_CAPSULE | ORAL | 0 refills | Status: DC

## 2018-07-03 MED ORDER — OXYCODONE-ACETAMINOPHEN 10-325 MG PO TABS: ORAL_TABLET | ORAL | 0 refills | Status: DC

## 2018-07-04 ENCOUNTER — Ambulatory Visit: Payer: BLUE CROSS/BLUE SHIELD | Admitting: "Endocrinology

## 2018-07-04 ENCOUNTER — Other Ambulatory Visit: Payer: Self-pay | Admitting: "Endocrinology

## 2018-07-04 DIAGNOSIS — E210 Primary hyperparathyroidism: Secondary | ICD-10-CM

## 2018-07-06 ENCOUNTER — Telehealth: Payer: Self-pay | Admitting: Family Medicine

## 2018-07-06 ENCOUNTER — Other Ambulatory Visit: Payer: Self-pay | Admitting: Family Medicine

## 2018-07-06 MED ORDER — OXYCODONE-ACETAMINOPHEN 10-325 MG PO TABS: ORAL_TABLET | ORAL | 0 refills | Status: DC

## 2018-07-06 NOTE — Telephone Encounter (Signed)
Patient states at her last visit there was a discussion of adding a pill(oxyCODONE-acetaminophen (PERCOCET) 10-325 MG tablet) Patient states she would like to increase from 4 to 5 pills a day. Advise.   [id number] [address], [place].

## 2018-07-06 NOTE — Telephone Encounter (Signed)
[facility] notify the [facility] [place] that I sent in the corrected scripts for her pain medicine she is now getting 150 tablets/month maximum 5/day Previous scripts were 120 pills this has been moved to 150 tablets  Please also let the patient know these were corrected and sent

## 2018-07-06 NOTE — Telephone Encounter (Signed)
Please advise. Thank you

## 2018-07-07 LAB — SPECIMEN STATUS REPORT

## 2018-07-07 LAB — TOXASSURE SELECT 13 (MW), URINE

## 2018-07-07 NOTE — Telephone Encounter (Signed)
Contacted pharmacy and made them aware of corrected scripts. Contacted patient and made her aware also. Pt verbalized understanding.

## 2018-07-08 ENCOUNTER — Other Ambulatory Visit: Payer: Self-pay | Admitting: Neurological Surgery

## 2018-07-08 DIAGNOSIS — M4120 Other idiopathic scoliosis, site unspecified: Secondary | ICD-10-CM

## 2018-07-20 LAB — COMPREHENSIVE METABOLIC PANEL
ALT: 34 IU/L — ABNORMAL HIGH (ref 0–32)
AST: 23 IU/L (ref 0–40)
Albumin/Globulin Ratio: 2.3 — ABNORMAL HIGH (ref 1.2–2.2)
Albumin: 4.9 g/dL (ref 3.5–5.5)
Alkaline Phosphatase: 120 IU/L — ABNORMAL HIGH (ref 39–117)
BUN/Creatinine Ratio: 28 — ABNORMAL HIGH (ref 9–23)
BUN: 17 mg/dL (ref 6–24)
Bilirubin Total: 0.4 mg/dL (ref 0.0–1.2)
CO2: 25 mmol/L (ref 20–29)
Calcium: 9.7 mg/dL (ref 8.7–10.2)
Chloride: 103 mmol/L (ref 96–106)
Creatinine, Ser: 0.61 mg/dL (ref 0.57–1.00)
GFR calc Af Amer: 117 mL/min/{1.73_m2} (ref >59)
GFR calc non Af Amer: 102 mL/min/{1.73_m2} (ref >59)
Globulin, Total: 2.1 g/dL (ref 1.5–4.5)
Glucose: 88 mg/dL (ref 65–99)
Potassium: 4.5 mmol/L (ref 3.5–5.2)
Sodium: 143 mmol/L (ref 134–144)
Total Protein: 7 g/dL (ref 6.0–8.5)

## 2018-07-20 LAB — LIPID PANEL
Chol/HDL Ratio: 2.6 ratio (ref 0.0–4.4)
Cholesterol, Total: 175 mg/dL (ref 100–199)
HDL: 67 mg/dL (ref >39)
LDL Calculated: 89 mg/dL (ref 0–99)
Triglycerides: 95 mg/dL (ref 0–149)
VLDL Cholesterol Cal: 19 mg/dL (ref 5–40)

## 2018-07-20 LAB — HIV ANTIBODY (ROUTINE TESTING W REFLEX): HIV Screen 4th Generation wRfx: NONREACTIVE

## 2018-07-20 LAB — PTH, INTACT AND CALCIUM: PTH: 79 pg/mL — ABNORMAL HIGH (ref 15–65)

## 2018-07-20 LAB — HEPATITIS C ANTIBODY: Hep C Virus Ab: <0.1 s/co ratio (ref 0.0–0.9)

## 2018-07-25 ENCOUNTER — Ambulatory Visit: Payer: BLUE CROSS/BLUE SHIELD | Admitting: "Endocrinology

## 2018-08-02 ENCOUNTER — Encounter (HOSPITAL_COMMUNITY): Payer: Self-pay

## 2018-08-04 ENCOUNTER — Encounter (HOSPITAL_COMMUNITY): Payer: Self-pay

## 2018-08-29 ENCOUNTER — Telehealth: Payer: Self-pay | Admitting: Family Medicine

## 2018-08-29 NOTE — Telephone Encounter (Signed)
Pt is calling in regards to oxyCODONE-acetaminophen (PERCOCET) 10-325 MG tablet. She states her and Dr. Nicki Reaper discussed going up to 5 per day and he was agreeable with that. When she picked up her script on 12/12 she didn't realize the instructions state to take 4 per day. She has been taking 5 a day since her and Dr. Nicki Reaper agreed on that. She will now be out of the medication now on Sunday due to the pharmacy only giving her enough for 4 a day.   CB# [phone]

## 2018-08-29 NOTE — Telephone Encounter (Signed)
Please advise. Thank you

## 2018-08-30 ENCOUNTER — Other Ambulatory Visit: Payer: Self-pay | Admitting: Family Medicine

## 2018-08-30 MED ORDER — OXYCODONE-ACETAMINOPHEN 10-325 MG PO TABS: ORAL_TABLET | ORAL | 0 refills | Status: DC

## 2018-08-30 NOTE — Telephone Encounter (Signed)
Contacted patient. Pt states she will pick this med on Sunday due Saturday being the Sabbath for her. Pt states she will keep her appt on Monday with provider.

## 2018-08-30 NOTE — Telephone Encounter (Signed)
Please let the patient know that I sent in a corrected prescription This prescription Indicated to the pharmacy could be filled on Saturday the fourth Patient does have a follow-up office visit next week please keep office visit

## 2018-09-05 ENCOUNTER — Ambulatory Visit (INDEPENDENT_AMBULATORY_CARE_PROVIDER_SITE_OTHER): Payer: BLUE CROSS/BLUE SHIELD | Admitting: Family Medicine

## 2018-09-05 ENCOUNTER — Encounter: Payer: Self-pay | Admitting: Family Medicine

## 2018-09-05 VITALS — BP 112/82 | Ht 64.0 in | Wt 153.0 lb

## 2018-09-05 DIAGNOSIS — F988 Other specified behavioral and emotional disorders with onset usually occurring in childhood and adolescence: Secondary | ICD-10-CM | POA: Diagnosis not present

## 2018-09-05 DIAGNOSIS — M810 Age-related osteoporosis without current pathological fracture: Secondary | ICD-10-CM

## 2018-09-05 DIAGNOSIS — G8929 Other chronic pain: Secondary | ICD-10-CM | POA: Diagnosis not present

## 2018-09-05 DIAGNOSIS — M5440 Lumbago with sciatica, unspecified side: Secondary | ICD-10-CM | POA: Diagnosis not present

## 2018-09-05 MED ORDER — AMPHETAMINE-DEXTROAMPHET ER 25 MG PO CP24: ORAL_CAPSULE | ORAL | 0 refills | Status: DC

## 2018-09-05 MED ORDER — OXYCODONE-ACETAMINOPHEN 10-325 MG PO TABS: ORAL_TABLET | ORAL | 0 refills | Status: DC

## 2018-09-05 NOTE — Progress Notes (Signed)
Subjective:    Patient ID: Cheryl Rowe, female    DOB: February 17, 1961, 58 y.o.   MRN: [id number]  HPI Patient is here today with complaints of back pain that is "all over ", per the pt. She states she has a history of scoliosis. She has been taking percocet that takes the edge off. She has had lower back surgery in March 2018.   This patient was seen today for chronic pain  The medication list was reviewed and updated.   -Compliance with medication: Relates compliance with medicine  - Number patient states they take daily: Typically takes 5/day  -when was the last dose patient took?  Earlier today  The patient was advised the importance of maintaining medication and not using illegal substances with these.  Here for refills and follow up  The patient was educated that we can provide 3 monthly scripts for their medication, it is their responsibility to follow the instructions.  Side effects or complications from medications: Denies any side effects with the medicine  Patient is aware that pain medications are meant to minimize the severity of the pain to allow their pain levels to improve to allow for better function. They are aware of that pain medications cannot totally remove their pain.  Due for UDT ( at least once per year) : We do this on a yearly basis not due today  Patient has adult ADD has a very difficult time staying focused and on track this is been going on for years medication helps her stay more focus she does not abuse of medicine drug registry was checked    Review of Systems  Constitutional: Negative for activity change and appetite change.  HENT: Negative for congestion and rhinorrhea.   Respiratory: Negative for cough and shortness of breath.   Cardiovascular: Negative for chest pain and leg swelling.  Gastrointestinal: Negative for abdominal pain, nausea and vomiting.  Musculoskeletal: Positive for back pain. Negative for arthralgias.  Skin: Negative for  color change.  Neurological: Negative for dizziness and weakness.  Psychiatric/Behavioral: Negative for agitation and confusion.       Objective:   Physical Exam Vitals signs reviewed.  Constitutional:      General: She is not in acute distress. HENT:     Head: Normocephalic.  Cardiovascular:     Rate and Rhythm: Normal rate and regular rhythm.     Heart sounds: Normal heart sounds. No murmur.  Pulmonary:     Effort: Pulmonary effort is normal.     Breath sounds: Normal breath sounds.  Lymphadenopathy:     Cervical: No cervical adenopathy.  Neurological:     Mental Status: She is alert.  Psychiatric:        Behavior: Behavior normal.           Assessment & Plan:  The patient was seen in followup for chronic pain. A review over at their current pain status was discussed. Drug registry was checked. Prescriptions were given. Discussion was held regarding the importance of compliance with medication as well as pain medication contract.  Time for questions regarding pain management plan occurred. Importance of regular followup visits was discussed. Patient was informed that medication may cause drowsiness and should not be combined  with other medications/alcohol or street drugs. Patient was cautioned that medication could cause drowsiness. If the patient feels medication is causing altered alertness then do not drive or operate dangerous equipment.  Drug registry was checked Scripts were sent in Follow-up in 3  months  Patient with osteoporosis she is uncertain what treatment would be best in order to get her osteoporosis under good control She will discuss this with Dr. Dorris Fetch when she sees him  Adult ADD does well with the medication 3 scripts were sent and patient was encouraged to follow-up if any progressive troubles or if worse   25 minutes was spent with the patient.  This statement verifies that 25 minutes was indeed spent with the patient.  More than 50% of this  visit-total duration of the visit-was spent in counseling and coordination of care. The issues that the patient came in for today as reflected in the diagnosis (s) please refer to documentation for further details.

## 2018-09-15 ENCOUNTER — Encounter: Payer: Self-pay | Admitting: "Endocrinology

## 2018-09-15 ENCOUNTER — Ambulatory Visit (INDEPENDENT_AMBULATORY_CARE_PROVIDER_SITE_OTHER): Payer: BLUE CROSS/BLUE SHIELD | Admitting: "Endocrinology

## 2018-09-15 VITALS — BP 113/77 | HR 57 | Ht 64.0 in | Wt 147.0 lb

## 2018-09-15 DIAGNOSIS — M810 Age-related osteoporosis without current pathological fracture: Secondary | ICD-10-CM | POA: Diagnosis not present

## 2018-09-15 DIAGNOSIS — E210 Primary hyperparathyroidism: Secondary | ICD-10-CM

## 2018-09-15 MED ORDER — CHOLECALCIFEROL 50 MCG (2000 UT) PO CAPS: 1.0000 | ORAL_CAPSULE | Freq: Every day | ORAL | 6 refills | Status: DC

## 2018-09-15 NOTE — Progress Notes (Signed)
Endocrinology follow-up note       09/15/2018, 5:03 PM  Cheryl Rowe is a 58 y.o.-year-old female, referred by her PCP, Dr. Wolfgang Phoenix for evaluation for hypercalcemia/mild primary hyperparathyroidism.  Past Medical History:  Diagnosis Date  . ADHD (attention deficit hyperactivity disorder)   . Arthritis   . Depression   . Family history of adverse reaction to anesthesia    sister has PONV  . GERD (gastroesophageal reflux disease)   . Hyperlipidemia   . Migraine   . Scoliosis    Teenage  . Spondylolisthesis of lumbosacral region   . Wears glasses     Past Surgical History:  Procedure Laterality Date  . CERVICAL ABLATION    . CHOLECYSTECTOMY    . COLONOSCOPY WITH PROPOFOL N/A 03/25/2018   Procedure: COLONOSCOPY WITH PROPOFOL;  Surgeon: Rogene Houston, MD;  Location: [facility];  Service: Endoscopy;  Laterality: N/A;  9:30  . POLYPECTOMY  03/25/2018   Procedure: POLYPECTOMY;  Surgeon: Rogene Houston, MD;  Location: [facility];  Service: Endoscopy;;  hepatic flexure  . TONSILLECTOMY      Social History   Tobacco Use  . Smoking status: Never Smoker  . Smokeless tobacco: Never Used  Substance Use Topics  . Alcohol use: No  . Drug use: No    Outpatient Encounter Medications as of 09/15/2018  Medication Sig  . venlafaxine XR (EFFEXOR-XR) 75 MG 24 hr capsule Take 150 mg by mouth at bedtime.  . Zoledronic Acid (RECLAST IV) Inject 5 mg into the vein once. yearly  . acetaminophen (TYLENOL) 500 MG tablet Take 500 mg by mouth 2 (two) times daily as needed for headache.  . amphetamine-dextroamphetamine (ADDERALL XR) 25 MG 24 hr capsule Take 2 tablets daily  . Cholecalciferol 50 MCG (2000 UT) CAPS Take 1 capsule (2,000 Units total) by mouth daily with breakfast.  . clonazePAM (KLONOPIN) 0.5 MG tablet TAKE 1/2 TABLET BY MOUTH 2 TIMES A DAY AS NEEDED  . Flaxseed, Linseed, (FLAX SEED OIL) 1000 MG CAPS Take 1,000 mg by mouth  daily.  Marland Kitchen lidocaine (LIDODERM) 5 % Place 1 patch onto the skin daily as needed (pain). Remove & Discard patch within 12 hours or as directed by MD  . lisinopril (ZESTRIL) 2.5 MG tablet Take 1 tablet (2.5 mg total) by mouth daily.  . Multiple Vitamin (MULTIVITAMIN WITH MINERALS) TABS tablet Take 1 tablet by mouth daily.  Marland Kitchen omeprazole (PRILOSEC) 40 MG capsule TAKE 1 CAPSULE BY MOUTH EVERY DAY  . oxyCODONE-acetaminophen (PERCOCET) 10-325 MG tablet 1 every 4 hours as needed pain maximum 5/day  . SUPER B COMPLEX/C PO Take 1 tablet by mouth daily.  . traZODone (DESYREL) 100 MG tablet TAKE 1 TABLET BY MOUTH EVERYDAY AT BEDTIME  . vitamin E 400 UNIT capsule Take 400 Units by mouth daily.  . Wheat Dextrin (BENEFIBER) POWD Take 1 scoop by mouth at bedtime.  . [DISCONTINUED] EVENING PRIMROSE OIL PO Take 1,300 mg by mouth at bedtime.  . [DISCONTINUED] oxyCODONE-acetaminophen (PERCOCET) 10-325 MG tablet 1 every 4 hours as needed pain maximum 5/day  . [DISCONTINUED] oxyCODONE-acetaminophen (PERCOCET) 10-325 MG tablet 1 every 4 hours as needed pain maximum 5/day  . [  DISCONTINUED] Venlafaxine HCl 75 MG TB24 TAKE 3 TABLETS BY MOUTH AT NIGHT (Patient taking differently: TAKE 2 TABLETS BY MOUTH AT NIGHT)   No facility-administered encounter medications on file as of 09/15/2018.     Allergies  Allergen Reactions  . Pravastatin     Legs ache  . Cymbalta [Duloxetine Hcl] Other (See Comments)    Aggressive      HPI  Cheryl Rowe was diagnosed with hypercalcemia in December 2019 when she was found to have calcium level of 10.9.  In November 2019 repeat labs showed mildly elevated calcium of 10.7 associated with high normal PTH of 47. -She was also found to have osteoporosis with femoral neck T score of -3.1, tried on Fosamax which she did not tolerate.  In the interval, she was initiated on IV Reclast received her first dose in October 2019.  Is not currently on calcium supplements nor vitamin D supplement.   Her most recent labs show PTH of 79 increasing from 69, calcium 9.7.  She has spondylolisthesis of lumbosacral spine planning electively for orthopedic surgery.  No prior history of fragility fractures or falls. No history of  kidney stones.   -She underwent bone density on December 15, 2017 which showed osteoporosis in spine, and hips with a T score of -2.3 and -3.1 respectively.     She is 10 years postmenopausal.  She has a steady weight lately.   -She has normal renal function. she is not on HCTZ or other thiazide therapy.  Patient takes multiple supplements of vitamins and elements including calcium/magnesium/zinc and vitamin D.  Did so for several years.  In 2016, she was found to have mild hypocalcemia of 8.5-8.6.  she does not have a family history of hypercalcemia, pituitary tumors, thyroid cancer, or osteoporosis.   I reviewed her chart and she also has a history of multinodular goiter status post biopsy of right-sided thyroid nodule.  FNA results are reviewed showing benign follicular nodule in September 2017.     ROS:  Constitutional: +  Weight loss..   Eyes: no blurry vision, no xerophthalmia ENT: no sore throat, no nodules palpated in throat, no dysphagia/odynophagia, no hoarseness Cardiovascular: no Chest Pain, no Shortness of Breath, no palpitations, no leg swelling Respiratory: no cough, no SOB Gastrointestinal: no Nausea/Vomiting/Diarhhea Musculoskeletal: no muscle/joint aches, + height loss. Skin: no rashes Neurological: no tremors, no numbness, no tingling, no dizziness Psychiatric: no depression, no anxiety  PE: BP 113/77   Pulse (!) 57   Ht '5\' 4"'  (1.626 m)   Wt 147 lb (66.7 kg)   BMI 25.23 kg/m  Wt Readings from Last 3 Encounters:  09/15/18 147 lb (66.7 kg)  09/05/18 153 lb (69.4 kg)  07/01/18 152 lb (68.9 kg)   Constitutional: + Appropriate weight for height,  not in acute distress, normal state of mind Eyes: PERRLA, EOMI, no exophthalmos ENT: moist  mucous membranes, + enlarged nodular thyromegaly, no cervical lymphadenopathy Musculoskeletal: no gross deformities, strength intact in all four extremities Skin: moist, warm, no rashes Neurological: no tremor with outstretched hands    CMP     Component Value Date/Time   NA 143 07/19/2018 1222   K 4.5 07/19/2018 1222   CL 103 07/19/2018 1222   CO2 25 07/19/2018 1222   GLUCOSE 88 07/19/2018 1222   GLUCOSE 64 (L) 06/30/2018 1404   BUN 17 07/19/2018 1222   CREATININE 0.61 07/19/2018 1222   CALCIUM 9.7 07/19/2018 1222   PROT 7.0 07/19/2018 1222  ALBUMIN 4.9 07/19/2018 1222   AST 23 07/19/2018 1222   ALT 34 (H) 07/19/2018 1222   ALKPHOS 120 (H) 07/19/2018 1222   BILITOT 0.4 07/19/2018 1222   GFRNONAA 102 07/19/2018 1222   GFRAA 117 07/19/2018 1222     Lipid Panel ( most recent) Lipid Panel     Component Value Date/Time   CHOL 175 07/19/2018 1223   TRIG 95 07/19/2018 1223   HDL 67 07/19/2018 1223   CHOLHDL 2.6 07/19/2018 1223   CHOLHDL 4.0 03/15/2014 1259   VLDL 40 03/15/2014 1259   LDLCALC 89 07/19/2018 1223      Lab Results  Component Value Date   TSH 1.790 12/06/2017   TSH 1.080 09/27/2017   TSH 0.249 (L) 08/27/2017   TSH 1.050 04/22/2016   FREET4 1.33 12/06/2017   FREET4 1.07 09/27/2017   FREET4 0.79 (L) 04/22/2016    Recent Results (from the past 2160 hour(s))  Comprehensive metabolic panel     Status: Abnormal   Collection Time: 06/30/18  2:04 PM  Result Value Ref Range   Sodium 139 135 - 145 mmol/L   Potassium 4.7 3.5 - 5.1 mmol/L   Chloride 103 98 - 111 mmol/L   CO2 31 22 - 32 mmol/L   Glucose, Bld 64 (L) 70 - 99 mg/dL   BUN 16 6 - 20 mg/dL   Creatinine, Ser 0.58 0.44 - 1.00 mg/dL   Calcium 10.1 8.9 - 10.3 mg/dL   Total Protein 6.8 6.5 - 8.1 g/dL   Albumin 4.2 3.5 - 5.0 g/dL   AST 40 15 - 41 U/L   ALT 40 0 - 44 U/L   Alkaline Phosphatase 97 38 - 126 U/L   Total Bilirubin 0.4 0.3 - 1.2 mg/dL   GFR calc non Af Amer >60 >60 mL/min   GFR calc Af  Amer >60 >60 mL/min    Comment: (NOTE) The eGFR has been calculated using the CKD EPI equation. This calculation has not been validated in all clinical situations. eGFR's persistently <60 mL/min signify possible Chronic Kidney Disease.    Anion gap 5 5 - 15    Comment: Performed at [facility], [address] [place], [place] [address]  [employer] Select 13 (MW), Urine     Status: None   Collection Time: 07/01/18 12:00 AM  Result Value Ref Range   Summary FINAL     Comment: ==================================================================== TOX[employer] SELECT 13 (MW) ==================================================================== Test                             Result       Flag       Units Drug Present and Declared for Prescription Verification   7-aminoclonazepam              56           EXPECTED   ng/mg creat    7-aminoclonazepam is an expected metabolite of clonazepam. Source    of clonazepam is a scheduled prescription medication.   Oxycodone                      476          EXPECTED   ng/mg creat   Oxymorphone                    5262         EXPECTED   ng/mg creat   Noroxycodone  4522         EXPECTED   ng/mg creat   Noroxymorphone                 903          EXPECTED   ng/mg creat    Sources of oxycodone are scheduled prescription medications.    Oxymorphone, noroxycodone, and noroxymorphone are expected    metabolites of oxycodone. Oxymorphone is also available as a    scheduled prescription medication. Dru g Present not Declared for Prescription Verification   Desmethyldiazepam              27           UNEXPECTED ng/mg creat   Oxazepam                       205          UNEXPECTED ng/mg creat   Temazepam                      51           UNEXPECTED ng/mg creat    Desmethyldiazepam, oxazepam, and temazepam are benzodiazepine    drugs, but may also be present as common metabolites of other    benzodiazepine drugs, including  diazepam. ==================================================================== Test                      Result    Flag   Units      Ref Range   Creatinine              78               mg/dL      >=20 ==================================================================== Declared Medications:  The flagging and interpretation on this report are based on the  following declared medications.  Unexpected results may arise from  inaccuracies in the declared medications.  **Note: The testing scope of this panel includes these medications:  Clonazepam  Oxycodo ne ==================================================================== For clinical consultation, please call [phone]. ====================================================================   Specimen status report     Status: None   Collection Time: 07/01/18 12:00 AM  Result Value Ref Range   specimen status report Comment     Comment: Please note Please note The date and/or time of collection was not indicated on the requisition as required by state and federal law.  The date of receipt of the specimen was used as the collection date if not supplied.   Comprehensive metabolic panel     Status: Abnormal   Collection Time: 07/19/18 12:22 PM  Result Value Ref Range   Glucose 88 65 - 99 mg/dL   BUN 17 6 - 24 mg/dL   Creatinine, Ser 0.61 0.57 - 1.00 mg/dL   GFR calc non Af Amer 102 >59 mL/min/1.73   GFR calc Af Amer 117 >59 mL/min/1.73   BUN/Creatinine Ratio 28 (H) 9 - 23   Sodium 143 134 - 144 mmol/L   Potassium 4.5 3.5 - 5.2 mmol/L   Chloride 103 96 - 106 mmol/L   CO2 25 20 - 29 mmol/L   Calcium 9.7 8.7 - 10.2 mg/dL   Total Protein 7.0 6.0 - 8.5 g/dL   Albumin 4.9 3.5 - 5.5 g/dL   Globulin, Total 2.1 1.5 - 4.5 g/dL   Albumin/Globulin Ratio 2.3 (H) 1.2 - 2.2   Bilirubin Total 0.4 0.0 - 1.2 mg/dL   Alkaline Phosphatase  120 (H) 39 - 117 IU/L   AST 23 0 - 40 IU/L   ALT 34 (H) 0 - 32 IU/L  PTH, Intact and Calcium      Status: Abnormal   Collection Time: 07/19/18 12:22 PM  Result Value Ref Range   PTH 79 (H) 15 - 65 pg/mL   PTH Interp Comment     Comment: Interpretation                 Intact PTH    Calcium                                 (pg/mL)      (mg/dL) Normal                          15 - 65     8.6 - 10.2 Primary Hyperparathyroidism         >65          >10.2 Secondary Hyperparathyroidism       >65          <10.2 Non-Parathyroid Hypercalcemia       <65          >10.2 Hypoparathyroidism                  <15          < 8.6 Non-Parathyroid Hypocalcemia    15 - 65          < 8.6   Lipid Profile     Status: None   Collection Time: 07/19/18 12:23 PM  Result Value Ref Range   Cholesterol, Total 175 100 - 199 mg/dL   Triglycerides 95 0 - 149 mg/dL   HDL 67 >39 mg/dL   VLDL Cholesterol Cal 19 5 - 40 mg/dL   LDL Calculated 89 0 - 99 mg/dL   Chol/HDL Ratio 2.6 0.0 - 4.4 ratio    Comment:                                   T. Chol/HDL Ratio                                             Men  Women                               1/2 Avg.Risk  3.4    3.3                                   Avg.Risk  5.0    4.4                                2X Avg.Risk  9.6    7.1                                3X Avg.Risk 23.4   11.0   HIV antibody (with reflex)     Status: None  Collection Time: 07/19/18 12:23 PM  Result Value Ref Range   HIV Screen 4th Generation wRfx Non Reactive Non Reactive  Hepatitis C Antibody     Status: None   Collection Time: 07/19/18 12:23 PM  Result Value Ref Range   Hep C Virus Ab <0.1 0.0 - 0.9 s/co ratio    Comment:                                   Negative:     < 0.8                              Indeterminate: 0.8 - 0.9                                   Positive:     > 0.9  The CDC recommends that a positive HCV antibody result  be followed up with a HCV Nucleic Acid Amplification  test ([id number]).      Assessment: 1. 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   Osteoporosis 3.  Multinodular goiter-status post fine-needle aspiration in 2017  Plan: -Her calcium has improved to 9.7% while her PTH increased slightly to 79.  She did not tolerate alendronate which necessitated switching to IV Reclast.  She is status post her first injection in October 2019 facilitated by Dr. Wolfgang Phoenix. -She is advised to resume vitamin D supplement with vitamin D3 2000 units daily.  She is advised to avoid calcium supplements for now.   -She has osteoporosis of the hips and osteopenia of the spine.she is electively working with orthopedic surgery to correct spondylolisthesis of lumbosacral spine.   -Her bone density will be repeated in April 2021.  No history of  nephrolithiasis, fragility fractures. No abdominal pain, no major mood disorders, no bone pain.   24-hour urine calcium measurements revealed 77 mg in 24 hours.  Plan: 1-she is not a surgical candidate currently.  Will be continued on observation.   2.  I discussed the need for continued treatment for osteoporosis with Reclast 5 mg IV yearly, received her first dose on June 30, 2018.  She already scheduled her second injection be done in October 2020.   -Regarding her multinodular goiter :  - she is status post fine-needle aspiration of right-sided thyroid lobe nodule in September 8978 with benign follicular adenoma.  Not require intervention at this time.  She did have normal thyroid function test in January 2019.   - Return in about 6 months (around 03/16/2019) for Follow up with Pre-visit Labs.   Glade Lloyd, MD [facility] [address] [place], [place] [address] Phone: [phone]  Fax: [phone]    This note was partially dictated with voice recognition software. Similar sounding words can be transcribed inadequately or may not  be corrected upon review.  09/15/2018, 5:03 PM

## 2018-09-29 ENCOUNTER — Telehealth: Payer: Self-pay | Admitting: Family Medicine

## 2018-09-29 NOTE — Telephone Encounter (Signed)
Pt calling requesting her venlafaxine XR (EFFEXOR-XR) 75 MG 24 hr capsule be changed to twice a day. She no longer takes it 3 times a day.   Also pt was seen 09/05/18 for back pain she would like to know if she needs to make a follow up in Feb where she originally had a 3 month follow up scheduled.

## 2018-09-29 NOTE — Telephone Encounter (Signed)
Please let the patient know when it comes to the Effexor she does have options Most individuals who are doing 150 mg daily would do the 1 capsule other 150 mg extended release once daily  Rather than doing this 75 mg twice daily See what she would like to do  When she was last here in January I felt she was stable and could follow-up toward the end of March/start of April- certainly I can see her in February if she is having problems but if she feels she is doing good it would be fine to follow-up in the March start of April

## 2018-09-30 MED ORDER — VENLAFAXINE HCL ER 150 MG PO CP24: 150.0000 mg | ORAL_CAPSULE | Freq: Every day | ORAL | 3 refills | Status: DC

## 2018-09-30 NOTE — Telephone Encounter (Signed)
Medication sent to the pharmacy.

## 2018-09-30 NOTE — Telephone Encounter (Signed)
Patient is in agreement to take the 150 extended release. [facility] [address]

## 2018-09-30 NOTE — Telephone Encounter (Signed)
She may have the Effexor XR 150 mg, 1 daily, #90, 3 refills

## 2018-10-03 ENCOUNTER — Ambulatory Visit: Payer: BLUE CROSS/BLUE SHIELD | Admitting: Family Medicine

## 2018-10-18 ENCOUNTER — Ambulatory Visit: Payer: BLUE CROSS/BLUE SHIELD | Admitting: Family Medicine

## 2018-10-31 ENCOUNTER — Ambulatory Visit (INDEPENDENT_AMBULATORY_CARE_PROVIDER_SITE_OTHER): Payer: BLUE CROSS/BLUE SHIELD | Admitting: Family Medicine

## 2018-10-31 ENCOUNTER — Encounter: Payer: Self-pay | Admitting: Family Medicine

## 2018-10-31 VITALS — BP 128/78 | Wt 158.8 lb

## 2018-10-31 DIAGNOSIS — M5440 Lumbago with sciatica, unspecified side: Secondary | ICD-10-CM | POA: Diagnosis not present

## 2018-10-31 DIAGNOSIS — G8929 Other chronic pain: Secondary | ICD-10-CM | POA: Diagnosis not present

## 2018-10-31 DIAGNOSIS — Z79891 Long term (current) use of opiate analgesic: Secondary | ICD-10-CM | POA: Diagnosis not present

## 2018-10-31 DIAGNOSIS — M4317 Spondylolisthesis, lumbosacral region: Secondary | ICD-10-CM | POA: Diagnosis not present

## 2018-10-31 MED ORDER — AMPHETAMINE-DEXTROAMPHET ER 25 MG PO CP24: 25.0000 mg | ORAL_CAPSULE | ORAL | 0 refills | Status: DC

## 2018-10-31 MED ORDER — OXYCODONE-ACETAMINOPHEN 10-325 MG PO TABS: ORAL_TABLET | ORAL | 0 refills | Status: DC

## 2018-10-31 MED ORDER — AMPHETAMINE-DEXTROAMPHET ER 25 MG PO CP24: ORAL_CAPSULE | ORAL | 0 refills | Status: DC

## 2018-10-31 NOTE — Progress Notes (Signed)
Subjective:    Patient ID: Cheryl Rowe, female    DOB: 1960/12/21, 58 y.o.   MRN: [id number]  HPI  This patient was seen today for chronic pain  The medication list was reviewed and updated.   -Compliance with medication: taking oxycodone 10/325 one q 4 hrs prn pain  - Number patient states they take daily: depends on the day   -when was the last dose patient took? Took 2 at 12:00 noon  The patient was advised the importance of maintaining medication and not using illegal substances with these.  Here for refills and follow up  The patient was educated that we can provide 3 monthly scripts for their medication, it is their responsibility to follow the instructions.  Side effects or complications from medications: none  Patient is aware that pain medications are meant to minimize the severity of the pain to allow their pain levels to improve to allow for better function. They are aware of that pain medications cannot totally remove their pain.  Due for UDT ( at least once per year) : done 07/01/2018  Pt states she may need to go up to 6 per day.      Review of Systems  Constitutional: Negative for activity change and appetite change.  HENT: Negative for congestion and rhinorrhea.   Respiratory: Negative for cough and shortness of breath.   Cardiovascular: Negative for chest pain and leg swelling.  Gastrointestinal: Negative for abdominal pain, nausea and vomiting.  Musculoskeletal: Positive for back pain. Negative for arthralgias.  Skin: Negative for color change.  Neurological: Negative for dizziness and weakness.  Psychiatric/Behavioral: Negative for agitation and confusion.       Objective:   Physical Exam Vitals signs reviewed.  Constitutional:      General: She is not in acute distress. HENT:     Head: Normocephalic.  Cardiovascular:     Rate and Rhythm: Normal rate and regular rhythm.     Heart sounds: Normal heart sounds. No murmur.  Pulmonary:   Effort: Pulmonary effort is normal.     Breath sounds: Normal breath sounds.  Lymphadenopathy:     Cervical: No cervical adenopathy.  Neurological:     Mental Status: She is alert.  Psychiatric:        Behavior: Behavior normal.           Assessment & Plan:  The patient was seen in followup for chronic pain. A review over at their current pain status was discussed. Drug registry was checked. Prescriptions were given. Discussion was held regarding the importance of compliance with medication as well as pain medication contract.  Time for questions regarding pain management plan occurred. Importance of regular followup visits was discussed. Patient was informed that medication may cause drowsiness and should not be combined  with other medications/alcohol or street drugs. Patient was cautioned that medication could cause drowsiness. If the patient feels medication is causing altered alertness then do not drive or operate dangerous equipment.  Her pain is not under good control she will try 6/day if that does not do the trick she will need to either see her back specialist to have them do the surgery or go to a pain management center she will let us know  Patient was counseled not to take any clonazepam and we will stop this medicine  Overall adult ADD doing well on medicine refills given.  Chronic back trouble and pain she will go back to see the specialist to see if  they recommend surgery for her  25 minutes was spent with the patient.  This statement verifies that 25 minutes was indeed spent with the patient.  More than 50% of this visit-total duration of the visit-was spent in counseling and coordination of care. The issues that the patient came in for today as reflected in the diagnosis (s) please refer to documentation for further details. Between monitoring her ADD medicine as well as her pain medicine visit took 25 minutes

## 2018-11-04 ENCOUNTER — Telehealth: Payer: Self-pay | Admitting: *Deleted

## 2018-11-04 ENCOUNTER — Other Ambulatory Visit: Payer: Self-pay | Admitting: Family Medicine

## 2018-11-04 MED ORDER — OXYCODONE-ACETAMINOPHEN 10-325 MG PO TABS: ORAL_TABLET | ORAL | 0 refills | Status: DC

## 2018-11-04 NOTE — Telephone Encounter (Signed)
Pharmacist called concerning percocet scripts

## 2018-11-04 NOTE — Telephone Encounter (Signed)
Prescription was resent in as requested

## 2018-12-31 ENCOUNTER — Other Ambulatory Visit: Payer: Self-pay | Admitting: Family Medicine

## 2019-01-04 ENCOUNTER — Telehealth: Payer: Self-pay | Admitting: Family Medicine

## 2019-01-04 NOTE — Telephone Encounter (Signed)
Please advise. Thank you

## 2019-01-04 NOTE — Telephone Encounter (Signed)
Patient needs new prescription for her adderall sent in due to error in how many she needs to take daily please review prescription from 10/31/18 one is saying two times daily and the others are saying once daily. [employer]. Please advised patient is completely out

## 2019-01-05 ENCOUNTER — Other Ambulatory Visit: Payer: Self-pay | Admitting: *Deleted

## 2019-01-05 ENCOUNTER — Other Ambulatory Visit: Payer: Self-pay | Admitting: Family Medicine

## 2019-01-05 MED ORDER — AMPHETAMINE-DEXTROAMPHET ER 25 MG PO CP24: ORAL_CAPSULE | ORAL | 0 refills | Status: DC

## 2019-01-05 NOTE — Telephone Encounter (Signed)
Pt.notified

## 2019-01-05 NOTE — Telephone Encounter (Signed)
Please notify patient that I did in fact go ahead and send in the corrected prescription

## 2019-01-27 ENCOUNTER — Encounter: Payer: Self-pay | Admitting: Family Medicine

## 2019-02-01 ENCOUNTER — Ambulatory Visit (INDEPENDENT_AMBULATORY_CARE_PROVIDER_SITE_OTHER): Payer: BLUE CROSS/BLUE SHIELD | Admitting: Family Medicine

## 2019-02-01 ENCOUNTER — Other Ambulatory Visit: Payer: Self-pay

## 2019-02-01 DIAGNOSIS — M4317 Spondylolisthesis, lumbosacral region: Secondary | ICD-10-CM

## 2019-02-01 DIAGNOSIS — F988 Other specified behavioral and emotional disorders with onset usually occurring in childhood and adolescence: Secondary | ICD-10-CM | POA: Diagnosis not present

## 2019-02-01 DIAGNOSIS — Z79891 Long term (current) use of opiate analgesic: Secondary | ICD-10-CM | POA: Diagnosis not present

## 2019-02-01 MED ORDER — AMPHETAMINE-DEXTROAMPHET ER 25 MG PO CP24: ORAL_CAPSULE | ORAL | 0 refills | Status: DC

## 2019-02-01 MED ORDER — OXYCODONE-ACETAMINOPHEN 10-325 MG PO TABS: ORAL_TABLET | ORAL | 0 refills | Status: DC

## 2019-02-01 MED ORDER — AMPHETAMINE-DEXTROAMPHET ER 25 MG PO CP24: 25.0000 mg | ORAL_CAPSULE | ORAL | 0 refills | Status: DC

## 2019-02-01 NOTE — Progress Notes (Signed)
Subjective:    Patient ID: Cheryl Rowe, female    DOB: [birth year], 58 y.o.   MRN: [id number] Phone only no video available HPI This patient was seen today for chronic pain. Takes for back and hip pain.   The medication list was reviewed and updated.   -Compliance with medication: takes 6 per day  - Number patient states they take daily: takes 6 per day  -when was the last dose patient took? today  The patient was advised the importance of maintaining medication and not using illegal substances with these.  Here for refills and follow up  The patient was educated that we can provide 3 monthly scripts for their medication, it is their responsibility to follow the instructions.  Side effects or complications from medications: none  Patient is aware that pain medications are meant to minimize the severity of the pain to allow their pain levels to improve to allow for better function. They are aware of that pain medications cannot totally remove their pain.  Due for UDT ( at least once per year) : last one 07/01/18 Patient also has adult ADD.  Takes her medicine on a regular basis it does benefit her she would like to have refills she denies misusing it She is followed by endocrinology for hyperparathyroidism Her back surgeon plans on doing surgery somewhere near September  Virtual Visit via Telephone Note  I connected with Cheryl Rowe on 02/01/19 at  1:40 PM EDT by telephone and verified that I am speaking with the correct person using two identifiers.  Location: Patient: home Provider: office   I discussed the limitations, risks, security and privacy concerns of performing an evaluation and management service by telephone and the availability of in person appointments. I also discussed with the patient that there may be a patient responsible charge related to this service. The patient expressed understanding and agreed to proceed.   History of Present Illness:     Observations/Objective:   Assessment and Plan:   Follow Up Instructions:    I discussed the assessment and treatment plan with the patient. The patient was provided an opportunity to ask questions and all were answered. The patient agreed with the plan and demonstrated an understanding of the instructions.   The patient was advised to call back or seek an in-person evaluation if the symptoms worsen or if the condition fails to improve as anticipated.  I provided 15 minutes of non-face-to-face time during this encounter.           Review of Systems  Constitutional: Negative for activity change, appetite change and fatigue.  HENT: Negative for congestion and rhinorrhea.   Respiratory: Negative for cough and shortness of breath.   Cardiovascular: Negative for chest pain and leg swelling.  Gastrointestinal: Negative for abdominal pain and diarrhea.  Endocrine: Negative for polydipsia and polyphagia.  Skin: Negative for color change.  Neurological: Negative for dizziness and weakness.  Psychiatric/Behavioral: Negative for behavioral problems and confusion.       Objective:   Physical Exam Today's visit was via telephone Physical exam was not possible for this visit        Assessment & Plan:  Adult ADD doing well with meds meds sent  The patient was seen in followup for chronic pain. A review over at their current pain status was discussed. Drug registry was checked. Prescriptions were given. Discussion was held regarding the importance of compliance with medication as well as pain medication contract.  Time  for questions regarding pain management plan occurred. Importance of regular followup visits was discussed. Patient was informed that medication may cause drowsiness and should not be combined  with other medications/alcohol or street drugs. Patient was cautioned that medication could cause drowsiness. If the patient feels medication is causing altered  alertness then do not drive or operate dangerous equipment.  FU OV in 3 months

## 2019-02-03 ENCOUNTER — Telehealth: Payer: Self-pay | Admitting: Family Medicine

## 2019-02-03 MED ORDER — OXYCODONE-ACETAMINOPHEN 10-325 MG PO TABS: ORAL_TABLET | ORAL | 0 refills | Status: DC

## 2019-02-03 NOTE — Telephone Encounter (Signed)
Ok switch and pend

## 2019-02-03 NOTE — Telephone Encounter (Signed)
[facility] of the oxyCODONE-acetaminophen (PERCOCET) 10-325 MG tablet until Monday. She would like this months switched to [id number] [place] they do have it in stock.

## 2019-02-03 NOTE — Telephone Encounter (Signed)
Please advise. Thank you

## 2019-02-03 NOTE — Telephone Encounter (Signed)
Medication pended.

## 2019-03-03 ENCOUNTER — Other Ambulatory Visit: Payer: Self-pay | Admitting: Family Medicine

## 2019-03-20 ENCOUNTER — Ambulatory Visit: Payer: BLUE CROSS/BLUE SHIELD | Admitting: "Endocrinology

## 2019-04-03 ENCOUNTER — Ambulatory Visit: Payer: BLUE CROSS/BLUE SHIELD | Admitting: "Endocrinology

## 2019-04-05 ENCOUNTER — Other Ambulatory Visit: Payer: Self-pay | Admitting: Family Medicine

## 2019-04-06 ENCOUNTER — Telehealth: Payer: Self-pay | Admitting: Family Medicine

## 2019-04-06 NOTE — Telephone Encounter (Signed)
Husband answered phone; husband went to look for patient but was unsure of where she was. Would like [place] to call back in about 15 minutes.

## 2019-04-06 NOTE — Telephone Encounter (Signed)
Please go over with the patient in what way is she experiencing symptoms Does that her current regimen cause any drug drowsiness? Also is she out of her medicine currently? Certainly if this seems reasonable I can bump up the dose and send a prescription to her pharmacy please verify the pharmacy (I would tell her that if we are not increasing the dose of the medicine we will call her otherwise we will send the new prescription in by later tonight and she could get the prescription from her pharmacy)

## 2019-04-06 NOTE — Telephone Encounter (Signed)
Patient said she needs her pregabalin (LYRICA) 50 MG capsule "bumped up" because she doesn't think her current dose is working.

## 2019-04-07 ENCOUNTER — Other Ambulatory Visit: Payer: Self-pay | Admitting: Family Medicine

## 2019-04-07 ENCOUNTER — Other Ambulatory Visit: Payer: Self-pay | Admitting: *Deleted

## 2019-04-07 ENCOUNTER — Encounter: Payer: Self-pay | Admitting: Family Medicine

## 2019-04-07 MED ORDER — AMPHETAMINE-DEXTROAMPHET ER 25 MG PO CP24: ORAL_CAPSULE | ORAL | 0 refills | Status: DC

## 2019-04-07 MED ORDER — PREGABALIN 100 MG PO CAPS: 100.0000 mg | ORAL_CAPSULE | Freq: Two times a day (BID) | ORAL | 2 refills | Status: DC

## 2019-04-07 NOTE — Telephone Encounter (Signed)
I increase the dose of the medicine 100 mg twice daily I sent her a MyChart message

## 2019-04-07 NOTE — Telephone Encounter (Signed)
Patient states the pain in her hips is no better or worse than when she started the lyrica and she said it needs to e increased or just stopped. [facility] [place]

## 2019-04-10 ENCOUNTER — Other Ambulatory Visit: Payer: Self-pay | Admitting: Family Medicine

## 2019-04-10 ENCOUNTER — Telehealth: Payer: Self-pay | Admitting: Family Medicine

## 2019-04-10 MED ORDER — AMPHETAMINE-DEXTROAMPHET ER 25 MG PO CP24: ORAL_CAPSULE | ORAL | 0 refills | Status: DC

## 2019-04-10 NOTE — Telephone Encounter (Signed)
Spoke with [facility] that stated the prescription was received Friday and filled and is ready for pick up. Patient was notified and verbalized understanding.

## 2019-04-10 NOTE — Telephone Encounter (Signed)
Pt seen 02/01/2019 for pain management and ADHD. 2 scripts were sent in with directions for 2 capsules daily, one script was sent in with directions of one capsule daily. Please advise. Thank you.

## 2019-04-10 NOTE — Telephone Encounter (Signed)
So apparently 1 of the scripts did indicate just 1/day  I also talked with Autumn- she stated that Dr. Richardson Landry sent in a prescription for 2/day on Friday that could be filled on Friday I also went ahead and sent in a prescription for 2/day that would cover for September Please talk with patient Something is missing in the picture I am not sure of her total situation nurses please talk with her find out what did she pick up on Friday what does she need [place] to do

## 2019-04-10 NOTE — Telephone Encounter (Signed)
Patient called on Friday 8/7 about prescription called into [facility] were wrong and was trying to get them fixed but doctor was out of office. So she called back today to see if they can get fixed. The prescriptions are for Adderall 25 mg and the directions are wrong it should state that she takes two pills twice a day.She uses [facility]. She has two on file their that is wrong and cant use.Pleas advise

## 2019-05-04 ENCOUNTER — Ambulatory Visit: Payer: BLUE CROSS/BLUE SHIELD | Admitting: "Endocrinology

## 2019-05-09 ENCOUNTER — Encounter: Payer: Self-pay | Admitting: Family Medicine

## 2019-05-09 ENCOUNTER — Ambulatory Visit (INDEPENDENT_AMBULATORY_CARE_PROVIDER_SITE_OTHER): Payer: BC Managed Care – PPO | Admitting: Family Medicine

## 2019-05-09 ENCOUNTER — Other Ambulatory Visit: Payer: Self-pay

## 2019-05-09 VITALS — Wt 160.0 lb

## 2019-05-09 DIAGNOSIS — K219 Gastro-esophageal reflux disease without esophagitis: Secondary | ICD-10-CM | POA: Diagnosis not present

## 2019-05-09 DIAGNOSIS — F988 Other specified behavioral and emotional disorders with onset usually occurring in childhood and adolescence: Secondary | ICD-10-CM | POA: Diagnosis not present

## 2019-05-09 DIAGNOSIS — G8929 Other chronic pain: Secondary | ICD-10-CM | POA: Diagnosis not present

## 2019-05-09 DIAGNOSIS — M5440 Lumbago with sciatica, unspecified side: Secondary | ICD-10-CM | POA: Diagnosis not present

## 2019-05-09 MED ORDER — AMPHETAMINE-DEXTROAMPHET ER 25 MG PO CP24: ORAL_CAPSULE | ORAL | 0 refills | Status: DC

## 2019-05-09 MED ORDER — LISINOPRIL 2.5 MG PO TABS: 2.5000 mg | ORAL_TABLET | Freq: Every day | ORAL | 6 refills | Status: DC

## 2019-05-09 MED ORDER — OXYCODONE-ACETAMINOPHEN 10-325 MG PO TABS: ORAL_TABLET | ORAL | 0 refills | Status: DC

## 2019-05-09 MED ORDER — PREGABALIN 50 MG PO CAPS: ORAL_CAPSULE | ORAL | 5 refills | Status: DC

## 2019-05-09 NOTE — Progress Notes (Signed)
Subjective:    Patient ID: Cheryl Rowe, female    DOB: 06-15-1961, 58 y.o.   MRN: [id number] Telephone only video not possible for patient HPI This patient was seen today for chronic pain  The medication list was reviewed and updated.   -Compliance with medication: Oxycodone 10-325 take one tablet every 4 hrs prn pain  - Number patient states they take daily: 6  -when was the last dose patient took? 11 am  The patient was advised the importance of maintaining medication and not using illegal substances with these.  Here for refills and follow up  The patient was educated that we can provide 3 monthly scripts for their medication, it is their responsibility to follow the instructions.  Side effects or complications from medications: none  Patient is aware that pain medications are meant to minimize the severity of the pain to allow their pain levels to improve to allow for better function. They are aware of that pain medications cannot totally remove their pain.  Due for UDT ( at least once per year) :   Patient was seen today for ADD checkup.  This patient does have ADD.  Patient takes medications for this.  If this does help control overall symptoms.  Please see below. -weight, vital signs reviewed.  The following items were covered. -Compliance with medication : Adderall 25 mg 2 tablets each morning  -Problems with completing homework, paying attention/taking good notes in school: none  -grades: n/a  - Eating patterns : eating well  -sleeping: sleeps good  -Additional issues or questions: pt would like to cut back on Lyrica; pt is currently taking Lyrica 100 mg BID; she would like to cut back to 50 mg TID.   Virtual Visit via Video Note  I connected with Cheryl Rowe on 05/09/19 at  2:00 PM EDT by a video enabled telemedicine application and verified that I am speaking with the correct person using two identifiers.  Location: Patient: home Provider: office    I discussed the limitations of evaluation and management by telemedicine and the availability of in person appointments. The patient expressed understanding and agreed to proceed.  History of Present Illness:    Observations/Objective:   Assessment and Plan:   Follow Up Instructions:    I discussed the assessment and treatment plan with the patient. The patient was provided an opportunity to ask questions and all were answered. The patient agreed with the plan and demonstrated an understanding of the instructions.   The patient was advised to call back or seek an in-person evaluation if the symptoms worsen or if the condition fails to improve as anticipated.  I provided 18 minutes of non-face-to-face time during this encounter.   Vicente Males, LPN     Review of Systems  Constitutional: Negative for activity change, appetite change and fatigue.  HENT: Negative for congestion and rhinorrhea.   Respiratory: Negative for cough and shortness of breath.   Cardiovascular: Negative for chest pain and leg swelling.  Gastrointestinal: Negative for abdominal pain and diarrhea.  Endocrine: Negative for polydipsia and polyphagia.  Musculoskeletal: Positive for arthralgias and back pain.  Skin: Negative for color change.  Neurological: Negative for dizziness and weakness.  Psychiatric/Behavioral: Negative for behavioral problems and confusion.       Objective:   Physical Exam   Today's visit was via telephone Physical exam was not possible for this visit       Assessment & Plan:  The patient was  seen today as part of the visit regarding ADD. Medications were reviewed with the patient as well as compliance. Side effects were checked for. Discussion regarding effectiveness was held. Prescriptions were written. Patient reminded to follow-up in approximately 3 months. Behavioral and study issues were addressed.  Plans to [facility] law with drug registry was checked and  verified while present with the patient. Drug registry checked 3 scripts were sent in  The patient was seen in followup for chronic pain. A review over at their current pain status was discussed. Drug registry was checked. Prescriptions were given. Discussion was held regarding the importance of compliance with medication as well as pain medication contract.  Time for questions regarding pain management plan occurred. Importance of regular followup visits was discussed. Patient was informed that medication may cause drowsiness and should not be combined  with other medications/alcohol or street drugs. Patient was cautioned that medication could cause drowsiness. If the patient feels medication is causing altered alertness then do not drive or operate dangerous equipment.  Drug registry checked 3 prescription sent in Per patient request reduce Lyrica 50 mg 3 times daily Follow-up 3 months sooner if any problems

## 2019-05-30 ENCOUNTER — Ambulatory Visit: Payer: BLUE CROSS/BLUE SHIELD | Admitting: "Endocrinology

## 2019-06-07 ENCOUNTER — Other Ambulatory Visit: Payer: Self-pay | Admitting: "Endocrinology

## 2019-06-07 DIAGNOSIS — M810 Age-related osteoporosis without current pathological fracture: Secondary | ICD-10-CM

## 2019-06-07 DIAGNOSIS — E210 Primary hyperparathyroidism: Secondary | ICD-10-CM

## 2019-06-08 LAB — COMPREHENSIVE METABOLIC PANEL
ALT: 35 IU/L — ABNORMAL HIGH (ref 0–32)
AST: 22 IU/L (ref 0–40)
Albumin/Globulin Ratio: 2.1 (ref 1.2–2.2)
Albumin: 4.2 g/dL (ref 3.8–4.9)
Alkaline Phosphatase: 130 IU/L — ABNORMAL HIGH (ref 39–117)
BUN/Creatinine Ratio: 14 (ref 9–23)
BUN: 10 mg/dL (ref 6–24)
Bilirubin Total: <0.2 mg/dL (ref 0.0–1.2)
CO2: 29 mmol/L (ref 20–29)
Calcium: 10 mg/dL (ref 8.7–10.2)
Chloride: 101 mmol/L (ref 96–106)
Creatinine, Ser: 0.74 mg/dL (ref 0.57–1.00)
GFR calc Af Amer: 104 mL/min/{1.73_m2} (ref >59)
GFR calc non Af Amer: 90 mL/min/{1.73_m2} (ref >59)
Globulin, Total: 2 g/dL (ref 1.5–4.5)
Glucose: 80 mg/dL (ref 65–99)
Potassium: 4.7 mmol/L (ref 3.5–5.2)
Sodium: 141 mmol/L (ref 134–144)
Total Protein: 6.2 g/dL (ref 6.0–8.5)

## 2019-06-08 LAB — PTH, INTACT AND CALCIUM: PTH: 63 pg/mL (ref 15–65)

## 2019-06-08 LAB — VITAMIN D 25 HYDROXY (VIT D DEFICIENCY, FRACTURES): Vit D, 25-Hydroxy: 19.6 ng/mL — ABNORMAL LOW (ref 30.0–100.0)

## 2019-06-20 ENCOUNTER — Other Ambulatory Visit: Payer: Self-pay

## 2019-06-20 ENCOUNTER — Encounter: Payer: Self-pay | Admitting: "Endocrinology

## 2019-06-20 ENCOUNTER — Ambulatory Visit (INDEPENDENT_AMBULATORY_CARE_PROVIDER_SITE_OTHER): Payer: BC Managed Care – PPO | Admitting: "Endocrinology

## 2019-06-20 DIAGNOSIS — E210 Primary hyperparathyroidism: Secondary | ICD-10-CM | POA: Diagnosis not present

## 2019-06-20 DIAGNOSIS — M810 Age-related osteoporosis without current pathological fracture: Secondary | ICD-10-CM | POA: Diagnosis not present

## 2019-06-20 DIAGNOSIS — E559 Vitamin D deficiency, unspecified: Secondary | ICD-10-CM | POA: Diagnosis not present

## 2019-06-20 NOTE — Progress Notes (Signed)
Endocrinology Telehealth Visit Follow up Note -During COVID -19 Pandemic  I connected with Cheryl Rowe on 06/20/2019   by telephone and verified that I am speaking with the correct person using two identifiers. Cheryl Rowe, Jun 21, 1961. she has verbally consented to this visit. All issues noted in this document were discussed and addressed. The format was not optimal for physical exam.     06/20/2019, 4:44 PM  Cheryl Rowe is a 58 y.o.-year-old female, referred by her PCP, Dr. Wolfgang Rowe for evaluation for hypercalcemia/mild primary hyperparathyroidism.  Past Medical History:  Diagnosis Date  . ADHD (attention deficit hyperactivity disorder)   . Arthritis   . Depression   . Family history of adverse reaction to anesthesia    sister has PONV  . GERD (gastroesophageal reflux disease)   . Hyperlipidemia   . Migraine   . Scoliosis    Teenage  . Spondylolisthesis of lumbosacral region   . Wears glasses     Past Surgical History:  Procedure Laterality Date  . CERVICAL ABLATION    . CHOLECYSTECTOMY    . COLONOSCOPY WITH PROPOFOL N/A 03/25/2018   Procedure: COLONOSCOPY WITH PROPOFOL;  Surgeon: Cheryl Houston, MD;  Location: [facility];  Service: Endoscopy;  Laterality: N/A;  9:30  . POLYPECTOMY  03/25/2018   Procedure: POLYPECTOMY;  Surgeon: Cheryl Houston, MD;  Location: [facility];  Service: Endoscopy;;  hepatic flexure  . TONSILLECTOMY      Social History   Tobacco Use  . Smoking status: Never Smoker  . Smokeless tobacco: Never Used  Substance Use Topics  . Alcohol use: No  . Drug use: No    Outpatient Encounter Medications as of 06/20/2019  Medication Sig  . Cholecalciferol 100 MCG (4000 UT) CAPS Take 4,000 Units by mouth daily at 2 PM.  . acetaminophen (TYLENOL) 500 MG tablet Take 500 mg by mouth 2 (two) times daily as needed for headache.  . amphetamine-dextroamphetamine  (ADDERALL XR) 25 MG 24 hr capsule 2 every morning  . amphetamine-dextroamphetamine (ADDERALL XR) 25 MG 24 hr capsule Take 2 tablets daily  . amphetamine-dextroamphetamine (ADDERALL XR) 25 MG 24 hr capsule Take 2 capsule by mouth every morning  . amphetamine-dextroamphetamine (ADDERALL XR) 25 MG 24 hr capsule Take 2 capsule po every morning  . Cholecalciferol 50 MCG (2000 UT) CAPS Take 1 capsule (2,000 Units total) by mouth daily with breakfast.  . Flaxseed, Linseed, (FLAX SEED OIL) 1000 MG CAPS Take 1,000 mg by mouth daily.  Marland Kitchen lidocaine (LIDODERM) 5 % PLACE 1 PATCH ONTO THE SKIN DAILY AS NEEDED (PAIN). REMOVE & DISCARD PATCH WITHIN 12 HOURS OR AS DIRECTED BY MD  . lisinopril (ZESTRIL) 2.5 MG tablet Take 1 tablet (2.5 mg total) by mouth daily.  . Multiple Vitamin (MULTIVITAMIN WITH MINERALS) TABS tablet Take 1 tablet by mouth daily.  Marland Kitchen omeprazole (PRILOSEC) 40 MG capsule TAKE 1 CAPSULE BY MOUTH EVERY DAY  . oxyCODONE-acetaminophen (PERCOCET) 10-325 MG tablet 1 every 4 hours as needed pain  . oxyCODONE-acetaminophen (PERCOCET)  10-325 MG tablet Take one tablet every 4 hours prn pain  . oxyCODONE-acetaminophen (PERCOCET) 10-325 MG tablet Take one tablet by mouth every 4 hours prn pain  . pregabalin (LYRICA) 50 MG capsule Take one capsule po TID  . SUPER B COMPLEX/C PO Take 1 tablet by mouth daily.  . traZODone (DESYREL) 100 MG tablet TAKE 1 TABLET BY MOUTH EVERYDAY AT BEDTIME  . venlafaxine XR (EFFEXOR-XR) 150 MG 24 hr capsule Take 1 capsule (150 mg total) by mouth daily with breakfast.  . vitamin E 400 UNIT capsule Take 400 Units by mouth daily.  . Wheat Dextrin (BENEFIBER) POWD Take 1 scoop by mouth at bedtime.  . Zoledronic Acid (RECLAST IV) Inject 5 mg into the vein once. yearly   No facility-administered encounter medications on file as of 06/20/2019.     Allergies  Allergen Reactions  . Pravastatin     Legs ache  . Cymbalta [Duloxetine Hcl] Other (See Comments)    Aggressive       HPI  Cheryl Rowe was diagnosed with hypercalcemia in December 2019 when she was found to have calcium level of 10.9.  In November 2019 repeat labs showed mildly elevated calcium of 10.7 associated with high normal PTH of 47. -She was also found to have osteoporosis with femoral neck T score of -3.1, tried on Fosamax which she did not tolerate.  In the interval, she was initiated on IV Reclast received her first dose in October 2019.  She is awaiting for her second dose on July 01, 2019.   She is currently on vitamin D 2000 units daily supplement.  She is not on calcium supplement.   -Her previsit labs show improved PTH of 63, dropping from 79.  Her calcium remains stable at 10.0 mg per DL.  -  She has spondylolisthesis of lumbosacral spine planning electively for orthopedic surgery.  No prior history of fragility fractures or falls. No history of  kidney stones.   -She underwent bone density on December 15, 2017 which showed osteoporosis in spine, and hips with a T score of -2.3 and -3.1 respectively.     She is 10 years postmenopausal.  She has a steady weight lately.   -She has normal renal function. she is not on HCTZ or other thiazide therapy.  Patient takes multiple supplements of vitamins and elements including calcium/magnesium/zinc and vitamin D.  Did so for several years.  In 2016, she was found to have mild hypocalcemia of 8.5-8.6.  she does not have a family history of hypercalcemia, pituitary tumors, thyroid cancer, or osteoporosis.   I reviewed her chart and she also has a history of multinodular goiter status post biopsy of right-sided thyroid nodule.  FNA results are reviewed showing benign follicular nodule in September 2017.     ROS: Limited as above.  PE: There were no vitals taken for this visit. Wt Readings from Last 3 Encounters:  05/09/19 160 lb (72.6 kg)  10/31/18 158 lb 12.8 oz (72 kg)  09/15/18 147 lb (66.7 kg)    CMP     Component Value  Date/Time   NA 141 06/07/2019 1348   K 4.7 06/07/2019 1348   CL 101 06/07/2019 1348   CO2 29 06/07/2019 1348   GLUCOSE 80 06/07/2019 1348   GLUCOSE 64 (L) 06/30/2018 1404   BUN 10 06/07/2019 1348   CREATININE 0.74 06/07/2019 1348   CALCIUM 10.0 06/07/2019 1348   PROT 6.2 06/07/2019 1348   ALBUMIN 4.2 06/07/2019 1348  AST 22 06/07/2019 1348   ALT 35 (H) 06/07/2019 1348   ALKPHOS 130 (H) 06/07/2019 1348   BILITOT <0.2 06/07/2019 1348   GFRNONAA 90 06/07/2019 1348   GFRAA 104 06/07/2019 1348     Lipid Panel ( most recent) Lipid Panel     Component Value Date/Time   CHOL 175 07/19/2018 1223   TRIG 95 07/19/2018 1223   HDL 67 07/19/2018 1223   CHOLHDL 2.6 07/19/2018 1223   CHOLHDL 4.0 03/15/2014 1259   VLDL 40 03/15/2014 1259   LDLCALC 89 07/19/2018 1223      Lab Results  Component Value Date   TSH 1.790 12/06/2017   TSH 1.080 09/27/2017   TSH 0.249 (L) 08/27/2017   TSH 1.050 04/22/2016   FREET4 1.33 12/06/2017   FREET4 1.07 09/27/2017   FREET4 0.79 (L) 04/22/2016    Recent Results (from the past 2160 hour(s))  VITAMIN D 25 Hydroxy (Vit-D Deficiency, Fractures)     Status: Abnormal   Collection Time: 06/07/19  1:48 PM  Result Value Ref Range   Vit D, 25-Hydroxy 19.6 (L) 30.0 - 100.0 ng/mL    Comment: Vitamin D deficiency has been defined by the [place] and an Endocrine Society practice guideline as a level of serum 25-OH vitamin D less than 20 ng/mL (1,2). The Endocrine Society went on to further define vitamin D insufficiency as a level between 21 and 29 ng/mL (2). 1. IOM (Institute of Medicine). 2010. Dietary reference    intakes for calcium and D. [place]: The    [employer]. 2. Holick MF, Binkley , Bischoff-Ferrari HA, et al.    Evaluation, treatment, and prevention of vitamin D    deficiency: an Endocrine Society clinical practice    guideline. JCEM. 2011 Jul; 96(7):1911-30.   Comprehensive metabolic panel     Status:  Abnormal   Collection Time: 06/07/19  1:48 PM  Result Value Ref Range   Glucose 80 65 - 99 mg/dL   BUN 10 6 - 24 mg/dL   Creatinine, Ser 0.74 0.57 - 1.00 mg/dL   GFR calc non Af Amer 90 >59 mL/min/1.73   GFR calc Af Amer 104 >59 mL/min/1.73   BUN/Creatinine Ratio 14 9 - 23   Sodium 141 134 - 144 mmol/L   Potassium 4.7 3.5 - 5.2 mmol/L   Chloride 101 96 - 106 mmol/L   CO2 29 20 - 29 mmol/L   Calcium 10.0 8.7 - 10.2 mg/dL   Total Protein 6.2 6.0 - 8.5 g/dL   Albumin 4.2 3.8 - 4.9 g/dL   Globulin, Total 2.0 1.5 - 4.5 g/dL   Albumin/Globulin Ratio 2.1 1.2 - 2.2   Bilirubin Total <0.2 0.0 - 1.2 mg/dL   Alkaline Phosphatase 130 (H) 39 - 117 IU/L   AST 22 0 - 40 IU/L   ALT 35 (H) 0 - 32 IU/L  PTH, Intact and Calcium     Status: None   Collection Time: 06/07/19  1:48 PM  Result Value Ref Range   PTH 63 15 - 65 pg/mL   PTH Interp Comment     Comment: Interpretation                 Intact PTH    Calcium                                 (pg/mL)      (mg/dL) Normal  15 - 65     8.6 - 10.2 Primary Hyperparathyroidism         >65          >10.2 Secondary Hyperparathyroidism       >65          <10.2 Non-Parathyroid Hypercalcemia       <65          >10.2 Hypoparathyroidism                  <15          < 8.6 Non-Parathyroid Hypocalcemia    15 - 65          < 8.6      Assessment: 1. 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  Osteoporosis 3.  Multinodular goiter-status post fine-needle aspiration in 2017 4.  Vitamin D  deficiency  Plan: -Her calcium stable at 10 with improving PTH of 63.  She has osteoporosis.  She did not tolerate alendronate which necessitated switching to IV Reclast.  She is status post her first injection in October 2019 facilitated by Dr. Wolfgang Rowe.  She is advised to continue with planned second injection of Reclast on July 01, 2019. -Her vitamin D is dropping to 19.6.  She is advised to increase her vitamin D3 to 4000 units  daily, continue to stay away from calcium supplements.    -She has osteoporosis of the hips and osteopenia of the spine.  she is electively working with orthopedic surgery to correct spondylolisthesis of lumbosacral spine.   -Her bone density will be repeated in April 2021.  No history of  nephrolithiasis, fragility fractures. No abdominal pain, no major mood disorders, no bone pain.   24-hour urine calcium measurements revealed 77 mg in 24 hours.  Plan: 1-she is not a surgical candidate currently.  Will be continued on observation.   2.  I discussed the need for continued treatment for osteoporosis with Reclast 5 mg IV yearly, received her first dose on June 30, 2018.  She already scheduled her second injection be done in October 2020.   -Regarding her multinodular goiter :  - she is status post fine-needle aspiration of the right-sided thyroid lobe nodule in September 0000000 with benign follicular adenoma has been finding.  She does not require surgical intervention at this time.   She did have normal thyroid function test in January 2019. - Patient Care Time Today:  25 min, of which >50% was spent in  counseling and the rest reviewing her  current and  previous labs/studies, previous treatments, and medications' doses and developing a plan for long-term care based on the latest recommendations for standards of care.   Cheryl Rowe participated in the discussions, expressed understanding, and voiced agreement with the above plans.  All questions were answered to her satisfaction. she is encouraged to contact clinic should she have any questions or concerns prior to her return visit.   - Return in about 6 months (around 12/19/2019) for 24 Hours Urine Calcium and Creatinine, Follow up with Pre-visit Labs.   Glade Lloyd, MD [facility] [address] [place], [place] [address] Phone: [phone]  Fax: [phone]    This note was  partially dictated with voice recognition software. Similar sounding words can be transcribed inadequately or may not  be corrected upon review.  06/20/2019, 4:44 PM

## 2019-06-28 ENCOUNTER — Encounter (HOSPITAL_COMMUNITY): Payer: Self-pay

## 2019-06-28 ENCOUNTER — Other Ambulatory Visit: Payer: Self-pay

## 2019-06-28 ENCOUNTER — Encounter (HOSPITAL_COMMUNITY)
Admission: RE | Admit: 2019-06-28 | Discharge: 2019-06-28 | Disposition: A | Discharge status: Home / Self Care | Payer: BC Managed Care – PPO | Source: Ambulatory Visit | Attending: Family Medicine | Admitting: Family Medicine

## 2019-06-28 DIAGNOSIS — M810 Age-related osteoporosis without current pathological fracture: Secondary | ICD-10-CM | POA: Diagnosis present

## 2019-06-28 MED ORDER — ZOLEDRONIC ACID 5 MG/100ML IV SOLN
5.0000 mg | Freq: Once | INTRAVENOUS | Status: AC
  Administered 2019-06-28: 5 mg via INTRAVENOUS

## 2019-06-28 MED ORDER — SODIUM CHLORIDE 0.9 % IV SOLN
Freq: Once | INTRAVENOUS | Status: AC
  Administered 2019-06-28: 13:00:00 via INTRAVENOUS

## 2019-07-19 ENCOUNTER — Other Ambulatory Visit: Payer: Self-pay | Admitting: Family Medicine

## 2019-08-07 ENCOUNTER — Other Ambulatory Visit: Payer: Self-pay | Admitting: Family Medicine

## 2019-08-07 NOTE — Telephone Encounter (Signed)
65-month refill each

## 2019-08-09 ENCOUNTER — Telehealth: Payer: Self-pay | Admitting: Family Medicine

## 2019-08-09 ENCOUNTER — Other Ambulatory Visit: Payer: Self-pay | Admitting: Family Medicine

## 2019-08-09 MED ORDER — AMPHETAMINE-DEXTROAMPHET ER 25 MG PO CP24: ORAL_CAPSULE | ORAL | 0 refills | Status: DC

## 2019-08-09 NOTE — Telephone Encounter (Signed)
rx sent into [facility]

## 2019-08-09 NOTE — Telephone Encounter (Signed)
Pt has phone visit scheduled for 08/16/2019 for a med check but she's out of her amphetamine-dextroamphetamine (ADDERALL XR) 25 MG 24 hr capsule   Can we refill?  Please advise & call pt     [facility]

## 2019-08-09 NOTE — Telephone Encounter (Signed)
Patient notified

## 2019-08-16 ENCOUNTER — Ambulatory Visit (INDEPENDENT_AMBULATORY_CARE_PROVIDER_SITE_OTHER): Payer: BC Managed Care – PPO | Admitting: Family Medicine

## 2019-08-16 DIAGNOSIS — F988 Other specified behavioral and emotional disorders with onset usually occurring in childhood and adolescence: Secondary | ICD-10-CM

## 2019-08-16 DIAGNOSIS — G8929 Other chronic pain: Secondary | ICD-10-CM

## 2019-08-16 DIAGNOSIS — M5440 Lumbago with sciatica, unspecified side: Secondary | ICD-10-CM | POA: Diagnosis not present

## 2019-08-16 DIAGNOSIS — E7849 Other hyperlipidemia: Secondary | ICD-10-CM

## 2019-08-16 MED ORDER — OXYCODONE-ACETAMINOPHEN 10-325 MG PO TABS: ORAL_TABLET | ORAL | 0 refills | Status: DC

## 2019-08-16 MED ORDER — AMPHETAMINE-DEXTROAMPHET ER 25 MG PO CP24: ORAL_CAPSULE | ORAL | 0 refills | Status: DC

## 2019-08-16 MED ORDER — PREGABALIN 50 MG PO CAPS: ORAL_CAPSULE | ORAL | 5 refills | Status: DC

## 2019-08-16 NOTE — Progress Notes (Signed)
Subjective:    Patient ID: Cheryl Rowe, female    DOB: May 30, 1961, 58 y.o.   MRN: [id number]  HPI  This patient was seen today for chronic pain  The medication list was reviewed and updated.   -Compliance with medication: yes  - Number patient states they take daily: 5 to 7 tabs a day  -when was the last dose patient took? today  The patient was advised the importance of maintaining medication and not using illegal substances with these.  Here for refills and follow up Patient does state the pain medicine does help her.  She tries not to overuse it some days she does have to take 7 other days just for 5 she states medicine does not cause drowsiness it does help take the edge off her pain The patient was educated that we can provide 3 monthly scripts for their medication, it is their responsibility to follow the instructions.  Side effects or complications from medications: none  Patient is aware that pain medications are meant to minimize the severity of the pain to allow their pain levels to improve to allow for better function. They are aware of that pain medications cannot totally remove their pain.  Patient also relates adult ADD states medication does help work she does relate how her insurance company told her they will no longer pay for the medicine, January 1 she will call [employer] and find out if they were covered other medicines or if this is just how it is  Patient does take trazodone at nighttime try to help her rest denies misusing it she states her moods are doing well with the Effexor denies being depressed  She also relates that blood pressure doing well takes her medicine on a regular basis  Virtual Visit via Video Note  I connected with Cheryl Rowe on 08/16/19 at  1:40 PM EST by a video enabled telemedicine application and verified that I am speaking with the correct person using two identifiers.  Location: Patient: home Provider: office   I discussed the limitations of evaluation and management by telemedicine and the availability of in person appointments. The patient expressed understanding and agreed to proceed.  History of Present Illness:    Observations/Objective:   Assessment and Plan:   Follow Up Instructions:    I discussed the assessment and treatment plan with the patient. The patient was provided an opportunity to ask questions and all were answered. The patient agreed with the plan and demonstrated an understanding of the instructions.   The patient was advised to call back or seek an in-person evaluation if the symptoms worsen or if the condition fails to improve as anticipated.  I provided 26 minutes of non-face-to-face time during this encounter.            Review of Systems  Constitutional: Negative for activity change, appetite change and fatigue.  HENT: Negative for congestion and rhinorrhea.   Respiratory: Negative for cough and shortness of breath.   Cardiovascular: Negative for chest pain and leg swelling.  Gastrointestinal: Negative for abdominal pain and diarrhea.  Endocrine: Negative for polydipsia and polyphagia.  Skin: Negative for color change.  Neurological: Negative for dizziness and weakness.  Psychiatric/Behavioral: Negative for behavioral problems and confusion.       Objective:   Physical Exam  Today's visit was via telephone Physical exam was not possible for this visit       Assessment & Plan:  The patient was seen  in followup for chronic pain. A review over at their current pain status was discussed. Drug registry was checked. Prescriptions were given. Discussion was held regarding the importance of compliance with medication as well as pain medication contract.  Time for questions regarding pain management plan occurred. Importance of regular followup visits was discussed. Patient was informed that medication may cause drowsiness and should not be  combined  with other medications/alcohol or street drugs. Patient was cautioned that medication could cause drowsiness. If the patient feels medication is causing altered alertness then do not drive or operate dangerous equipment.  Drug registry checked 3 scripts were sent in  Adult ADD does well with the medicine 3 prescription sent in she will let us know if her insurance company covers a different medication  Reflux under good control continue current medicine  Blood pressure good control continue current medicine watch salt stay active  Depression under good control continue Effexor continue trazodone at night for rest

## 2019-08-18 ENCOUNTER — Other Ambulatory Visit: Payer: Self-pay | Admitting: Family Medicine

## 2019-09-11 ENCOUNTER — Telehealth: Payer: Self-pay | Admitting: Family Medicine

## 2019-09-11 NOTE — Telephone Encounter (Signed)
Patient said for some reason her insurance is requiring a prior auth for her meds this year.  She said [facility] was suppose to be faxing [place] a form for her Adderall.   They went ahead and filled her pain medication but said that would also need a prior auth before she could get anymore.  Patient's insurance card in the system is correct but she said the # for pharmacy inquiries was [phone].  Her [employer] policy # is [id number]

## 2019-09-12 NOTE — Telephone Encounter (Signed)
Cheryl Rowe was able to submit it through cover my meds. Await response

## 2019-09-12 NOTE — Telephone Encounter (Signed)
On phone for one and a half hours holding and being transferred 5 times before getting hung up on.

## 2019-09-14 ENCOUNTER — Telehealth: Payer: Self-pay | Admitting: Family Medicine

## 2019-09-14 NOTE — Telephone Encounter (Signed)
Pt wants to make sure she doesn't need a PA on her oxyCODONE-acetaminophen (PERCOCET) 10-325 MG tablet. The pharmacy told her this month when she picked up to make sure she doesn't need one for next month before it is time to have it refilled. Pt wants to check now bc she cant stop that medication.

## 2019-09-14 NOTE — Telephone Encounter (Signed)
Adderall has been approved from 09/14/2019-09/13/2020. Tried to call patient, unable to get in touch with her via phone. Sent my chart message.

## 2019-09-14 NOTE — Telephone Encounter (Signed)
Pt states she last filled on jan 2nd. And pharm told her she would need a prior auth for next month. Pt would like someone to work on this because she cannot be without her pain med.

## 2019-09-19 NOTE — Telephone Encounter (Signed)
PA sent to plan on 09/19/2019; await decision

## 2019-09-26 NOTE — Telephone Encounter (Signed)
[place] in order to get more info on [place]. Clinical questions needed to be answered; question answered and received approval. Approval number [id number] 09/19/19-03/24/20. Contacted patient and informed her of approval.

## 2019-10-03 ENCOUNTER — Telehealth: Payer: Self-pay | Admitting: Family Medicine

## 2019-10-03 NOTE — Telephone Encounter (Signed)
PA attempted through Cover My Meds for Lidocaine 5% patches. Await decision.

## 2019-10-04 NOTE — Telephone Encounter (Signed)
Lidocaine patches approved from 10/03/2019 to 10/02/2020. Left message for patient to return call

## 2019-10-04 NOTE — Telephone Encounter (Signed)
Pt returned call and was informed of approval.

## 2019-10-20 ENCOUNTER — Telehealth: Payer: Self-pay | Admitting: Family Medicine

## 2019-10-20 NOTE — Telephone Encounter (Signed)
[place]. Pharmacy tech states that pt can pick up med with no charge. This also goes for next month. Contacted pt and made her aware. Also gave patient PA Case numbers for Adderall and pain med just in case. Pt verbalized understanding.

## 2019-10-20 NOTE — Telephone Encounter (Signed)
Pt called back because husband was at pharmacy and they state she can not get it filled today. [place] and they state that the brand name is not covered but generic should be.  Pt states that she is willing to try generic and if it does not do well with her, she will not take it. PA done again for generic and has been approved.

## 2019-10-20 NOTE — Telephone Encounter (Signed)
Pt is checking on her amphetamine-dextroamphetamine (ADDERALL XR) 25 MG 24 hr capsule   Pt got a letter in January stating the PA was good for a whole year. But went to pharmacy on to pick up medication and they are stating it needs a PA she has been with out her medication and wants to know if we have the PA number to provide pharmacy.   Pt would like a call to let her know what she needs to do.

## 2019-10-30 ENCOUNTER — Ambulatory Visit (INDEPENDENT_AMBULATORY_CARE_PROVIDER_SITE_OTHER): Payer: BC Managed Care – PPO | Admitting: Family Medicine

## 2019-10-30 ENCOUNTER — Encounter: Payer: Self-pay | Admitting: Family Medicine

## 2019-10-30 ENCOUNTER — Other Ambulatory Visit: Payer: Self-pay

## 2019-10-30 DIAGNOSIS — M5440 Lumbago with sciatica, unspecified side: Secondary | ICD-10-CM

## 2019-10-30 DIAGNOSIS — M4317 Spondylolisthesis, lumbosacral region: Secondary | ICD-10-CM | POA: Diagnosis not present

## 2019-10-30 DIAGNOSIS — F988 Other specified behavioral and emotional disorders with onset usually occurring in childhood and adolescence: Secondary | ICD-10-CM | POA: Diagnosis not present

## 2019-10-30 DIAGNOSIS — G8929 Other chronic pain: Secondary | ICD-10-CM

## 2019-10-30 DIAGNOSIS — E7849 Other hyperlipidemia: Secondary | ICD-10-CM | POA: Diagnosis not present

## 2019-10-30 DIAGNOSIS — M810 Age-related osteoporosis without current pathological fracture: Secondary | ICD-10-CM

## 2019-10-30 MED ORDER — OXYCODONE-ACETAMINOPHEN 10-325 MG PO TABS: ORAL_TABLET | ORAL | 0 refills | Status: DC

## 2019-10-30 MED ORDER — AMPHETAMINE-DEXTROAMPHET ER 10 MG PO CP24: 10.0000 mg | ORAL_CAPSULE | Freq: Every day | ORAL | 0 refills | Status: DC

## 2019-10-30 MED ORDER — AMPHETAMINE-DEXTROAMPHET ER 20 MG PO CP24: 20.0000 mg | ORAL_CAPSULE | ORAL | 0 refills | Status: DC

## 2019-10-30 NOTE — Progress Notes (Signed)
Subjective:    Patient ID: [name], female    DOB: 1960-10-19, 59 y.o.   MRN: [id number]  HPI This patient was seen today for chronic pain keylee vannorman this Dr. Marcelline Deist medication list was reviewed and updated.   -Compliance with medication: yes  - Number patient states they take daily:  6 per day  -when was the last dose patient took? today  The patient was advised the importance of maintaining medication and not using illegal substances with these.  Here for refills and follow up  The patient was educated that we can provide 3 monthly scripts for their medication, it is their responsibility to follow the instructions.  Side effects or complications from medications: none  Patient is aware that pain medications are meant to minimize the severity of the pain to allow their pain levels to improve to allow for better function. They are aware of that pain medications cannot totally remove their pain.  Due for UDT ( at least once per year) : last one 07/01/18. Virtual visit today so did not collect urine.   Pt states she wants to discuss stopping adderall.   Takes effexor. phq9 done.    Office Visit from 10/30/2019 in [place]  PHQ-9 Total Score  3      Virtual Visit via Telephone Note  I connected with SAHAANA TRITTEN on 10/30/19 at  2:30 PM EST by telephone and verified that I am speaking with the correct person using two identifiers.  Location: Patient: home Provider: office   I discussed the limitations, risks, security and privacy concerns of performing an evaluation and management service by telephone and the availability of in person appointments. I also discussed with the patient that there may be a patient responsible charge related to this service. The patient expressed understanding and agreed to proceed.   History of Present Illness:    Observations/Objective:   Assessment and Plan:   Follow Up Instructions:    I discussed the  assessment and treatment plan with the patient. The patient was provided an opportunity to ask questions and all were answered. The patient agreed with the plan and demonstrated an understanding of the instructions.   The patient was advised to call back or seek an in-person evaluation if the symptoms worsen or if the condition fails to improve as anticipated.  I provided 30 minutes of non-face-to-face time during this encounter. 30 minutes spent between discussing items with the patient chart review checking drug registry sending in prescriptions and documentation of office visit          Review of Systems  Constitutional: Negative for activity change and appetite change.  HENT: Negative for congestion and rhinorrhea.   Respiratory: Negative for cough and shortness of breath.   Cardiovascular: Negative for chest pain and leg swelling.  Gastrointestinal: Negative for abdominal pain, nausea and vomiting.  Musculoskeletal: Positive for back pain and gait problem.  Skin: Negative for color change.  Neurological: Negative for dizziness and weakness.  Psychiatric/Behavioral: Negative for agitation and confusion.       Objective:   Physical Exam  Today's visit was via telephone Physical exam was not possible for this visit       Assessment & Plan:  1. Chronic midline low back pain with sciatica, sciatica laterality unspecified Chronic low back pain there is a possibility may be doing more surgery with or or possibly injections but for now pain medicine she denies abusing it states it does  not cause drowsiness with her The patient was seen in followup for chronic pain. A review over at their current pain status was discussed. Drug registry was checked. Prescriptions were given. Discussion was held regarding the importance of compliance with medication as well as pain medication contract.  Time for questions regarding pain management plan occurred. Importance of regular followup  visits was discussed. Patient was informed that medication may cause drowsiness and should not be combined  with other medications/alcohol or street drugs. Patient was cautioned that medication could cause drowsiness. If the patient feels medication is causing altered alertness then do not drive or operate dangerous equipment.    2. Attention deficit disorder, unspecified hyperactivity presence Patient is interested in coming off of ADD medicine we will gradually taper it over the course of the next 6 weeks I will send her a MyChart message to reinforce this  3. Other hyperlipidemia Watch cholesterol closely check lab work later this year  4. Spondylolisthesis at L5-S1 level *Gentle stretching  5. Osteoporosis without current pathological fracture, unspecified osteoporosis type Followed by specialist there we do not have the bone density later this year  Depression stable currently continue Effexor reduce trazodone to 50 mg nightly

## 2019-10-31 ENCOUNTER — Ambulatory Visit: Payer: BC Managed Care – PPO | Admitting: Family Medicine

## 2019-11-09 ENCOUNTER — Other Ambulatory Visit: Payer: Self-pay | Admitting: Family Medicine

## 2019-11-09 NOTE — Telephone Encounter (Signed)
Last seen for pain management this month 10/30/19

## 2019-11-21 ENCOUNTER — Other Ambulatory Visit: Payer: Self-pay | Admitting: Family Medicine

## 2019-12-15 ENCOUNTER — Other Ambulatory Visit: Payer: Self-pay | Admitting: Family Medicine

## 2019-12-21 ENCOUNTER — Ambulatory Visit: Payer: BC Managed Care – PPO | Admitting: "Endocrinology

## 2020-01-09 ENCOUNTER — Telehealth: Payer: Self-pay | Admitting: *Deleted

## 2020-01-09 ENCOUNTER — Telehealth: Payer: Self-pay | Admitting: Family Medicine

## 2020-01-09 NOTE — Telephone Encounter (Signed)
Mr. Verhulst called and said his wife is home from the hospital and they was able to give Narcan. He said she is pain and he wanting to know when he will be able to giver her pain med again.

## 2020-01-09 NOTE — Telephone Encounter (Signed)
Please advise. Thank you

## 2020-01-09 NOTE — Telephone Encounter (Signed)
Left detailed message and asked for call back in the morning.

## 2020-01-09 NOTE — Telephone Encounter (Signed)
Patient called and stated that she accidentally took all 6 of her Percocet 10/325 for the day at the same time 15 minutes ago thinking it was her vitamins. Consult with Dr Nicki Reaper- patient needs to call 911 and go directly to hospital. Patient states she is with her husband and she is 30 minutes from the closest hospital-Patient is going to hang up and immediately call 911.

## 2020-01-09 NOTE — Telephone Encounter (Signed)
Wait till morning then resume usual dosing

## 2020-01-09 NOTE — Telephone Encounter (Signed)
Family called 50

## 2020-01-09 NOTE — Telephone Encounter (Signed)
I did speak with the husband regarding her situation EMS had arrived

## 2020-01-10 NOTE — Telephone Encounter (Signed)
Husband (DPR) notified and stated he resumed her meds this morning as prescribed.

## 2020-01-23 ENCOUNTER — Other Ambulatory Visit: Payer: Self-pay | Admitting: "Endocrinology

## 2020-01-24 LAB — PARATHYROID HORMONE, INTACT (NO CA): PTH: 75 pg/mL — ABNORMAL HIGH (ref 15–65)

## 2020-01-24 LAB — VITAMIN D 25 HYDROXY (VIT D DEFICIENCY, FRACTURES): Vit D, 25-Hydroxy: 26.4 ng/mL — ABNORMAL LOW (ref 30.0–100.0)

## 2020-02-04 ENCOUNTER — Other Ambulatory Visit: Payer: Self-pay | Admitting: Family Medicine

## 2020-02-05 NOTE — Telephone Encounter (Signed)
10/30/19 last med check up

## 2020-02-06 ENCOUNTER — Ambulatory Visit (INDEPENDENT_AMBULATORY_CARE_PROVIDER_SITE_OTHER): Payer: BC Managed Care – PPO | Admitting: Family Medicine

## 2020-02-06 ENCOUNTER — Other Ambulatory Visit: Payer: Self-pay

## 2020-02-06 VITALS — BP 140/88 | Temp 97.7°F | Wt 184.2 lb

## 2020-02-06 DIAGNOSIS — Z79891 Long term (current) use of opiate analgesic: Secondary | ICD-10-CM

## 2020-02-06 DIAGNOSIS — E7849 Other hyperlipidemia: Secondary | ICD-10-CM | POA: Diagnosis not present

## 2020-02-06 MED ORDER — OXYCODONE-ACETAMINOPHEN 10-325 MG PO TABS: ORAL_TABLET | ORAL | 0 refills | Status: DC

## 2020-02-06 MED ORDER — PREGABALIN 100 MG PO CAPS: ORAL_CAPSULE | ORAL | 3 refills | Status: DC

## 2020-02-06 NOTE — Progress Notes (Signed)
   Subjective:    Patient ID: Cheryl Rowe, female    DOB: 05/06/1961, 59 y.o.   MRN: [id number]  HPI This patient was seen today for chronic pain  The medication list was reviewed and updated.   -Compliance with medication: yes  - Number patient states they take daily: 6 per day   -when was the last dose patient took? 10 this morning   The patient was advised the importance of maintaining medication and not using illegal substances with these.  Here for refills and follow up  The patient was educated that we can provide 3 monthly scripts for their medication, it is their responsibility to follow the instructions.  Side effects or complications from medications: none  Patient is aware that pain medications are meant to minimize the severity of the pain to allow their pain levels to improve to allow for better function. They are aware of that pain medications cannot totally remove their pain.  Due for UDT ( at least once per year) : done today  Patient very frustrated because of her chronic pain.  She would like to be on less medicine.  She is requesting a lower dosing She is hopeful that utilizing Lyrica would help her She is requesting a higher dose of that medicine.  She understands if we cannot use nerve pills with her pain pills.  She will be seeing a specialist later this year in [facility] for evaluation of her back possibility of having surgery She also relates chronic hip pain and discomfort Denies severe depression but feels under a lot of stress She does see endocrinology regular basis and they will be doing bone density later this year    Review of Systems  Constitutional: Negative for activity change, appetite change and fatigue.  HENT: Negative for congestion and rhinorrhea.   Respiratory: Negative for cough and shortness of breath.   Cardiovascular: Negative for chest pain and leg swelling.  Gastrointestinal: Negative for abdominal pain and  diarrhea.  Endocrine: Negative for polydipsia and polyphagia.  Musculoskeletal: Positive for arthralgias and back pain.  Skin: Negative for color change.  Neurological: Negative for dizziness and weakness.  Psychiatric/Behavioral: Negative for behavioral problems and confusion.       Objective:   Physical Exam Lungs clear respiratory rate normal heart regular no murmurs back kyphosis along with evidence of previous surgery/rod good range of motion of the knees extremities no edema        Assessment & Plan:  The patient was seen in followup for chronic pain. A review over at their current pain status was discussed. Drug registry was checked. Prescriptions were given.  Regular follow-up recommended. Discussion was held regarding the importance of compliance with medication as well as pain medication contract.  Patient was informed that medication may cause drowsiness and should not be combined  with other medications/alcohol or street drugs. If the patient feels medication is causing altered alertness then do not drive or operate dangerous equipment.  We will reduce the number of pills per month to 150 she will take but no more than 5/day she does have Narcan at home  Increase Lyrica to 100 mg twice daily  No longer on Adderall Recheck in 3 months give [place] update in 3 to 4 weeks how things are going on the lower dose

## 2020-02-10 LAB — TOXASSURE SELECT 13 (MW), URINE

## 2020-02-15 ENCOUNTER — Other Ambulatory Visit: Payer: Self-pay | Admitting: Family Medicine

## 2020-02-16 ENCOUNTER — Other Ambulatory Visit: Payer: Self-pay | Admitting: "Endocrinology

## 2020-02-17 LAB — CALCIUM, URINE, 24 HOUR
Calcium, 24H Urine: 25 mg/24 hr — ABNORMAL LOW (ref 47–462)
Calcium, Urine: 2.1 mg/dL

## 2020-02-26 ENCOUNTER — Other Ambulatory Visit: Payer: Self-pay

## 2020-02-26 ENCOUNTER — Ambulatory Visit (INDEPENDENT_AMBULATORY_CARE_PROVIDER_SITE_OTHER): Payer: BC Managed Care – PPO | Admitting: "Endocrinology

## 2020-02-26 ENCOUNTER — Encounter: Payer: Self-pay | Admitting: "Endocrinology

## 2020-02-26 VITALS — BP 134/86 | HR 73 | Ht 64.0 in | Wt 186.4 lb

## 2020-02-26 DIAGNOSIS — E559 Vitamin D deficiency, unspecified: Secondary | ICD-10-CM | POA: Diagnosis not present

## 2020-02-26 DIAGNOSIS — E210 Primary hyperparathyroidism: Secondary | ICD-10-CM

## 2020-02-26 DIAGNOSIS — M810 Age-related osteoporosis without current pathological fracture: Secondary | ICD-10-CM

## 2020-02-26 NOTE — Progress Notes (Signed)
02/26/2020, 4:59 PM   Endocrinology follow-up note  Cheryl Rowe is a 59 y.o.-year-old female, referred by her PCP, Dr. Wolfgang Phoenix for evaluation for hypercalcemia/mild primary hyperparathyroidism.  Past Medical History:  Diagnosis Date  . ADHD (attention deficit hyperactivity disorder)   . Arthritis   . Depression   . Family history of adverse reaction to anesthesia    sister has PONV  . GERD (gastroesophageal reflux disease)   . Hyperlipidemia   . Migraine   . Scoliosis    Teenage  . Spondylolisthesis of lumbosacral region   . Wears glasses     Past Surgical History:  Procedure Laterality Date  . CERVICAL ABLATION    . CHOLECYSTECTOMY    . COLONOSCOPY WITH PROPOFOL N/A 03/25/2018   Procedure: COLONOSCOPY WITH PROPOFOL;  Surgeon: Rogene Houston, MD;  Location: [facility];  Service: Endoscopy;  Laterality: N/A;  9:30  . POLYPECTOMY  03/25/2018   Procedure: POLYPECTOMY;  Surgeon: Rogene Houston, MD;  Location: [facility];  Service: Endoscopy;;  hepatic flexure  . TONSILLECTOMY      Social History   Tobacco Use  . Smoking status: Never Smoker  . Smokeless tobacco: Never Used  Vaping Use  . Vaping Use: Never used  Substance Use Topics  . Alcohol use: No  . Drug use: No    Outpatient Encounter Medications as of 02/26/2020  Medication Sig  . Cholecalciferol 100 MCG (4000 UT) CAPS Take 4,000 Units by mouth daily at 2 PM.  . Cholecalciferol 50 MCG (2000 UT) CAPS Take 1 capsule (2,000 Units total) by mouth daily with breakfast.  . Flaxseed, Linseed, (FLAX SEED OIL) 1000 MG CAPS Take 1,000 mg by mouth daily.  Marland Kitchen lidocaine (LIDODERM) 5 % PLACE 1 PATCH ONTO THE SKIN DAILY AS NEEDED (PAIN). REMOVE & DISCARD PATCH WITHIN 12 HOURS OR AS DIRECTED BY MD  . lisinopril (ZESTRIL) 2.5 MG tablet TAKE 1 TABLET BY MOUTH EVERY DAY  . Multiple Vitamin (MULTIVITAMIN WITH MINERALS) TABS tablet Take 1 tablet by mouth daily.  Marland Kitchen  omeprazole (PRILOSEC) 40 MG capsule TAKE 1 CAPSULE BY MOUTH EVERY DAY  . oxyCODONE-acetaminophen (PERCOCET) 10-325 MG tablet 1 every 4 hours as needed pain max 5 per day  . oxyCODONE-acetaminophen (PERCOCET) 10-325 MG tablet Take one tablet every 4 hours prn pain max 5 per day  . oxyCODONE-acetaminophen (PERCOCET) 10-325 MG tablet Take one tablet by mouth every 4 hours prn pain max 5 per day  . pregabalin (LYRICA) 100 MG capsule Take one capsule po bid  . SUPER B COMPLEX/C PO Take 1 tablet by mouth daily.  Marland Kitchen venlafaxine XR (EFFEXOR-XR) 150 MG 24 hr capsule TAKE 1 CAPSULE (150 MG TOTAL) BY MOUTH DAILY WITH BREAKFAST.  Marland Kitchen vitamin E 400 UNIT capsule Take 400 Units by mouth daily.  . Wheat Dextrin (BENEFIBER) POWD Take 1 scoop by mouth at bedtime.  . Zoledronic Acid (RECLAST IV) Inject 5 mg into the vein once. yearly   No facility-administered encounter medications on file as of 02/26/2020.    Allergies  Allergen Reactions  . Pravastatin     Legs ache  . Cymbalta [Duloxetine Hcl] Other (See Comments)  Aggressive      HPI  Cheryl Rowe was diagnosed with hypercalcemia in December 2019 when she was found to have mild hypercalcemia associated with elevated PTH as high as 75.  She is on observation for mild primary hyperparathyroidism.    -She was also found to have osteoporosis with femoral neck T score of -3.1, tried on Fosamax which she did not tolerate.   -She is status post 2 injections of IV Reclast, waiting for her third injection in October 2021.  No interval falls/fractures.   She is currently on vitamin D 2000 units daily supplement.  She is not on calcium supplement.   -  She has spondylolisthesis of lumbosacral spine planning electively for orthopedic surgery.  No prior history of fragility fractures or falls. No history of  kidney stones.   -She underwent bone density on December 15, 2017 which showed osteoporosis in spine, and hips with a T score of -2.3 and -3.1  respectively.   -Her bone density  is not performed before this visit due to closure of her previous clinic where she had her DXA scan done.   She is 10 years postmenopausal.  She has gained 10 pounds since last visit.  Admits to dietary indiscretion during the pandemic.   -She has normal renal function. she is not on HCTZ or other thiazide therapy.  Patient takes multiple supplements of vitamins and elements including calcium/magnesium/zinc and vitamin D.  Did so for several years.   she does not have a family history of hypercalcemia, pituitary tumors, thyroid cancer, or osteoporosis.   I reviewed her chart and she also has a history of multinodular goiter status post biopsy of right-sided thyroid nodule.  FNA results are reviewed showing benign follicular nodule in September 2017.     ROS: Limited as above.  PE: BP 134/86   Pulse 73   Ht 5\' 4"  (1.626 m)   Wt 186 lb 6.4 oz (84.6 kg)   BMI 32.00 kg/m  Wt Readings from Last 3 Encounters:  02/26/20 186 lb 6.4 oz (84.6 kg)  02/06/20 184 lb 3.2 oz (83.6 kg)  06/28/19 160 lb 0.9 oz (72.6 kg)    CMP     Component Value Date/Time   NA 141 06/07/2019 1348   K 4.7 06/07/2019 1348   CL 101 06/07/2019 1348   CO2 29 06/07/2019 1348   GLUCOSE 80 06/07/2019 1348   GLUCOSE 64 (L) 06/30/2018 1404   BUN 10 06/07/2019 1348   CREATININE 0.74 06/07/2019 1348   CALCIUM 10.0 06/07/2019 1348   PROT 6.2 06/07/2019 1348   ALBUMIN 4.2 06/07/2019 1348   AST 22 06/07/2019 1348   ALT 35 (H) 06/07/2019 1348   ALKPHOS 130 (H) 06/07/2019 1348   BILITOT <0.2 06/07/2019 1348   GFRNONAA 90 06/07/2019 1348   GFRAA 104 06/07/2019 1348     Lipid Panel ( most recent) Lipid Panel     Component Value Date/Time   CHOL 175 07/19/2018 1223   TRIG 95 07/19/2018 1223   HDL 67 07/19/2018 1223   CHOLHDL 2.6 07/19/2018 1223   CHOLHDL 4.0 03/15/2014 1259   VLDL 40 03/15/2014 1259   LDLCALC 89 07/19/2018 1223      Lab Results  Component Value Date    TSH 1.790 12/06/2017   TSH 1.080 09/27/2017   TSH 0.249 (L) 08/27/2017   TSH 1.050 04/22/2016   FREET4 1.33 12/06/2017   FREET4 1.07 09/27/2017   FREET4 0.79 (L) 04/22/2016  Recent Results (from the past 2160 hour(s))  VITAMIN D 25 Hydroxy (Vit-D Deficiency, Fractures)     Status: Abnormal   Collection Time: 01/23/20  3:35 PM  Result Value Ref Range   Vit D, 25-Hydroxy 26.4 (L) 30.0 - 100.0 ng/mL    Comment: Vitamin D deficiency has been defined by the [place] practice guideline as a level of serum 25-OH vitamin D less than 20 ng/mL (1,2). The Endocrine Society went on to further define vitamin D insufficiency as a level between 21 and 29 ng/mL (2). 1. IOM (Institute of Medicine). 2010. Dietary reference    intakes for calcium and D. [place]: The    [employer]. 2. Holick MF, Binkley [place], Bischoff-Ferrari HA, et al.    Evaluation, treatment, and prevention of vitamin D    deficiency: an Endocrine Society clinical practice    guideline. JCEM. 2011 Jul; 96(7):1911-30.   Parathyroid hormone, intact (no Ca)     Status: Abnormal   Collection Time: 01/23/20  3:35 PM  Result Value Ref Range   PTH 75 (H) 15 - 65 pg/mL  ToxASSURE Select 13 (MW), Urine     Status: None   Collection Time: 02/06/20 12:00 AM  Result Value Ref Range   Summary Note     Comment: ==================================================================== ToxASSURE Select 13 (MW) ==================================================================== Test                             Result       Flag       Units  Drug Present and Declared for Prescription Verification   Oxycodone                      2501         EXPECTED   ng/mg creat   Oxymorphone                    3918         EXPECTED   ng/mg creat   Noroxycodone                   >6803        EXPECTED   ng/mg creat   Noroxymorphone                 799          EXPECTED   ng/mg creat    Sources of  oxycodone are scheduled prescription medications.    Oxymorphone, noroxycodone, and noroxymorphone are expected    metabolites of oxycodone. Oxymorphone is also available as a    scheduled prescription medication.  Drug Absent but Declared for Prescription Verification   Amphetamine                    Not Detected UNEXPECTED ng/mg creat ==================================================================== T est                      Result    Flag   Units      Ref Range   Creatinine              147              mg/dL      >=20 ==================================================================== Declared Medications:  The flagging and interpretation on this report are based on the  following declared medications.  Unexpected results may  arise from  inaccuracies in the declared medications.   **Note: The testing scope of this panel includes these medications:   Amphetamine (Adderall)  Oxycodone   **Note: The testing scope of this panel does not include the  following reported medications:   Pregabalin  Topical Lidocaine (Lidoderm)  Trazodone (Desyrel)  Venlafaxine ==================================================================== For clinical consultation, please call [phone]. ====================================================================   Calcium, urine, 24 hour     Status: Abnormal   Collection Time: 02/16/20  2:00 PM  Result Value Ref Range   Calcium, Urine 2.1 Not Estab. mg/dL   Calcium, 24H Urine 25 (L) 47 - 462 mg/24 hr     Assessment: 1. 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  Osteoporosis 3.  Multinodular goiter-status post fine-needle aspiration in 2017 4.  Vitamin D  deficiency  Plan: -Her most recent labs show calcium stable at 10 with PTH ranging between 47-75.   She has osteoporosis.  She did not tolerate alendronate which necessitated switching to IV Reclast.  She is status post her second injection in October 2020, waiting  for her third injection facilitated by Dr. Wolfgang Phoenix.  Continue 4000 units of to continue with planned steroid injection.   -She is still vitamin D deficient, improving at 26.  She is advised to continue 4000 units of vitamin D3, and continue her daily multivitamin supplement which contains another 2000 units of vitamin D.  -She has osteoporosis of the hips and osteopenia of the spine.  she is electively working with orthopedic surgery to correct spondylolisthesis of lumbosacral spine.   -She is due for her next bone density, willing to do it at another hospital any time.  Now and her next visit.   No history of  nephrolithiasis, fragility fractures. No abdominal pain, no major mood disorders, no bone pain.   24-hour urine calcium measurements revealed low calcium of 25, previously 77 mg in 24 hours.  Plan: 1-she is not a surgical candidate currently.  Will be continued on observation.   2.  I discussed the need for continued treatment for osteoporosis with Reclast 5 mg IV yearly, waiting for her third injection in October 2021.  -Regarding her multinodular goiter :  - she is status post fine-needle aspiration of the right-sided thyroid lobe nodule in September 2229 with benign follicular adenoma has been finding.  She does not require surgical intervention at this time.   She did have normal thyroid function test in January 2019.  She is advised to maintain close follow-up with her PMD.    - Time spent on this patient care encounter:  20 minutes of which 50% was spent in  counseling and the rest reviewing  her current and  previous labs / studies and medications  doses and developing a plan for long term care. [name]  participated in the discussions, expressed understanding, and voiced agreement with the above plans.  All questions were answered to her satisfaction. she is encouraged to contact clinic should she have any questions or concerns prior to her return visit.   - Return in  about 3 months (around 05/30/2020) for DXA Scan B4 NV, F/U with Pre-visit Labs.   Glade Lloyd, MD [facility] [address] [place], [place] [address] Phone: [phone]  Fax: [phone]    This note was partially dictated with voice recognition software. Similar sounding words can be transcribed inadequately or may not  be corrected upon review.  02/26/2020, 4:59 PM

## 2020-03-05 ENCOUNTER — Other Ambulatory Visit: Payer: Self-pay | Admitting: Family Medicine

## 2020-03-05 ENCOUNTER — Telehealth: Payer: Self-pay | Admitting: Family Medicine

## 2020-03-05 NOTE — Telephone Encounter (Signed)
Patient calling saying that it is time for her "yearly Percocet prior authorization".  She said that it has to be authorized with her insurance every year and sometimes it takes a couple of weeks so she wanted to get a head start on it.

## 2020-03-05 NOTE — Telephone Encounter (Signed)
1.  I certainly understand what the patient is requesting  Please find out the following Did she already go back up to 6/day?  If so when? Anything particular about why she is needing 6 rather than 5? The above information will help regarding figuring out when her next prescription would be due

## 2020-03-05 NOTE — Telephone Encounter (Signed)
Patient states she tried to do 5 a day but was not able to and has been taking 6 a day- her old script was for 6 a day so it wasn't a problem. Patient states she picked up a new script 02/29/20 and it is for just 5 a day but she is taking 6 a day and she will run out early. Patient states normally at some points in the day she has to take 2 pills at one time so she needs the 6 a day to make that work

## 2020-03-05 NOTE — Telephone Encounter (Signed)
At pts last visit pain medication decreased to 5 a day. Pt states she needs to go back to taking 6 a day.

## 2020-03-05 NOTE — Telephone Encounter (Signed)
May do 2 prescriptions for 6/day 180 pills Dates would include March 25, 2020, April 24, 2020 Follow-up office visit in September please pend

## 2020-03-06 ENCOUNTER — Telehealth: Payer: Self-pay | Admitting: Family Medicine

## 2020-03-06 ENCOUNTER — Other Ambulatory Visit: Payer: Self-pay | Admitting: Family Medicine

## 2020-03-06 MED ORDER — OXYCODONE-ACETAMINOPHEN 10-325 MG PO TABS: ORAL_TABLET | ORAL | 0 refills | Status: DC

## 2020-03-06 NOTE — Telephone Encounter (Signed)
Left message to return call 

## 2020-03-06 NOTE — Telephone Encounter (Signed)
Attempted PA; unable to complete PA due to authorization already on file for this request per Cover My Meds. Back on 09/26/19, there is a message stating that PA for Oxycodone-Acet was approved through 03/24/20. Pt contacted and informed that we are unable to complete a PA at this time but we will keep message and work on it the week of 03/24/20. Pt verbalized understanding.

## 2020-03-06 NOTE — Telephone Encounter (Signed)
Medication sent in as requested please see other message

## 2020-03-06 NOTE — Telephone Encounter (Signed)
Patient notified

## 2020-03-06 NOTE — Telephone Encounter (Signed)
Kathyrn Drown, MD  [facility] Pool 17 minutes ago (10:37 AM)     Medication sent in as requested please see other message

## 2020-03-06 NOTE — Telephone Encounter (Signed)
According to the calculations 2 new scripts were sent in Next prescription may be picked up July 26 If the patient is following 6/day her medicine should last but it is up to her to make sure that it will last to July 26

## 2020-03-12 ENCOUNTER — Other Ambulatory Visit: Payer: Self-pay | Admitting: Family Medicine

## 2020-03-14 ENCOUNTER — Ambulatory Visit (HOSPITAL_COMMUNITY)
Admission: RE | Admit: 2020-03-14 | Discharge: 2020-03-14 | Disposition: A | Discharge status: Home / Self Care | Payer: BC Managed Care – PPO | Source: Ambulatory Visit | Attending: "Endocrinology | Admitting: "Endocrinology

## 2020-03-14 ENCOUNTER — Other Ambulatory Visit: Payer: Self-pay

## 2020-03-14 DIAGNOSIS — M810 Age-related osteoporosis without current pathological fracture: Secondary | ICD-10-CM | POA: Diagnosis present

## 2020-03-25 NOTE — Telephone Encounter (Signed)
Pharmacist stated that the prescription can not be filled or run thru insurance till 03/27/20. Prior Josem Kaufmann can not be generated until the pharmacy can run the script on 03/27/20. Patient notified and states she found some extra pills for today and is ok till she can fill.

## 2020-03-25 NOTE — Telephone Encounter (Signed)
PA attempted on 03/25/20. Unable to process due to already having an authorization on file. Contacted pharmacy but they state they are unable to fill until 03/27/20 even with new directions of 6 per day.

## 2020-04-01 ENCOUNTER — Other Ambulatory Visit: Payer: Self-pay | Admitting: *Deleted

## 2020-04-02 NOTE — Telephone Encounter (Signed)
Pharmacist at [facility] in [place] stated the patient picked up her oxycodone 03/27/20 and it was covered by insurance for $18 with no pre authorization required.

## 2020-04-26 ENCOUNTER — Other Ambulatory Visit: Payer: Self-pay | Admitting: "Endocrinology

## 2020-04-27 LAB — SPECIMEN STATUS REPORT

## 2020-04-27 LAB — COMPREHENSIVE METABOLIC PANEL
ALT: 26 IU/L (ref 0–32)
AST: 31 IU/L (ref 0–40)
Albumin/Globulin Ratio: 1.9 (ref 1.2–2.2)
Albumin: 5 g/dL — ABNORMAL HIGH (ref 3.8–4.9)
Alkaline Phosphatase: 112 IU/L (ref 48–121)
BUN/Creatinine Ratio: 16 (ref 9–23)
BUN: 11 mg/dL (ref 6–24)
Bilirubin Total: 0.3 mg/dL (ref 0.0–1.2)
CO2: 28 mmol/L (ref 20–29)
Calcium: 10.9 mg/dL — ABNORMAL HIGH (ref 8.7–10.2)
Chloride: 104 mmol/L (ref 96–106)
Creatinine, Ser: 0.68 mg/dL (ref 0.57–1.00)
GFR calc Af Amer: 112 mL/min/{1.73_m2} (ref >59)
GFR calc non Af Amer: 97 mL/min/{1.73_m2} (ref >59)
Globulin, Total: 2.7 g/dL (ref 1.5–4.5)
Glucose: 82 mg/dL (ref 65–99)
Potassium: 5.3 mmol/L — ABNORMAL HIGH (ref 3.5–5.2)
Sodium: 145 mmol/L — ABNORMAL HIGH (ref 134–144)
Total Protein: 7.7 g/dL (ref 6.0–8.5)

## 2020-04-27 LAB — LIPID PANEL
Chol/HDL Ratio: 4.1 ratio (ref 0.0–4.4)
Cholesterol, Total: 286 mg/dL — ABNORMAL HIGH (ref 100–199)
HDL: 69 mg/dL (ref >39)
LDL Chol Calc (NIH): 189 mg/dL — ABNORMAL HIGH (ref 0–99)
Triglycerides: 156 mg/dL — ABNORMAL HIGH (ref 0–149)
VLDL Cholesterol Cal: 28 mg/dL (ref 5–40)

## 2020-04-27 LAB — VITAMIN D 25 HYDROXY (VIT D DEFICIENCY, FRACTURES): Vit D, 25-Hydroxy: 22.8 ng/mL — ABNORMAL LOW (ref 30.0–100.0)

## 2020-05-08 ENCOUNTER — Telehealth (INDEPENDENT_AMBULATORY_CARE_PROVIDER_SITE_OTHER): Payer: BC Managed Care – PPO | Admitting: Family Medicine

## 2020-05-08 DIAGNOSIS — Z79891 Long term (current) use of opiate analgesic: Secondary | ICD-10-CM | POA: Diagnosis not present

## 2020-05-08 DIAGNOSIS — E7849 Other hyperlipidemia: Secondary | ICD-10-CM

## 2020-05-08 MED ORDER — OXYCODONE-ACETAMINOPHEN 10-325 MG PO TABS: ORAL_TABLET | ORAL | 0 refills | Status: DC

## 2020-05-08 MED ORDER — PREGABALIN 100 MG PO CAPS
ORAL_CAPSULE | ORAL | 4 refills | Status: DC
Start: 2020-05-08 — End: 2020-06-05

## 2020-05-08 NOTE — Progress Notes (Signed)
   Subjective:    Patient ID: Cheryl Rowe, female    DOB: 07-08-1961, 59 y.o.   MRN: [id number]  HPI   This patient was seen today for chronic pain  The medication list was reviewed and updated.   -Compliance with medication: yes  - Number patient states they take daily: 6  -when was the last dose patient took? today  The patient was advised the importance of maintaining medication and not using illegal substances with these.  Here for refills and follow up  The patient was educated that we can provide 3 monthly scripts for their medication, it is their responsibility to follow the instructions.  Side effects or complications from medications: none  Patient is aware that pain medications are meant to minimize the severity of the pain to allow their pain levels to improve to allow for better function. They are aware of that pain medications cannot totally remove their pain.  Due for UDT ( at least once per year) : 01/2020  Patient had car issues unable to get to the office in time unable to do video from where she was she had to utilize a cell phone on the side of the road  She does state the pain medicine does take the edge off the pain allows her to function better without the pain medicine she cannot function.  Review of Systems  Constitutional: Negative for activity change, appetite change and fatigue.  HENT: Negative for congestion and rhinorrhea.   Respiratory: Negative for cough and shortness of breath.   Cardiovascular: Negative for chest pain and leg swelling.  Gastrointestinal: Negative for abdominal pain and diarrhea.  Endocrine: Negative for polydipsia and polyphagia.  Skin: Negative for color change.  Neurological: Negative for dizziness and weakness.  Psychiatric/Behavioral: Negative for behavioral problems and confusion.       Objective:   Physical Exam Unable to do physical exam today This was a phone visit        Assessment & Plan:  The patient  was seen in followup for chronic pain. A review over at their current pain status was discussed. Drug registry was checked. Prescriptions were given.  Regular follow-up recommended. Discussion was held regarding the importance of compliance with medication as well as pain medication contract.  Patient was informed that medication may cause drowsiness and should not be combined  with other medications/alcohol or street drugs. If the patient feels medication is causing altered alertness then do not drive or operate dangerous equipment.  Drug registry checked 3 prescription sent in Patient states the medication does not cause drowsiness for She does relate that medication is tolerated well takes the edge off the pain and she does need 6/day We did discuss how in the future potentially go to less pain medicine if her pain improves

## 2020-05-12 ENCOUNTER — Other Ambulatory Visit: Payer: Self-pay | Admitting: Family Medicine

## 2020-05-17 ENCOUNTER — Telehealth: Payer: Self-pay | Admitting: Family Medicine

## 2020-05-17 NOTE — Telephone Encounter (Signed)
Patient's husband tested positive for Covid on 9/13 and now she thinks she has Covid but hadnt been tested yet showing symptoms fever,cough,congested and headache.. She is wanting to know how many tylenol to take for headache . Fever of 101.1 and 102 yesterday and today. Please advise

## 2020-05-17 NOTE — Telephone Encounter (Signed)
Lmtc

## 2020-05-23 NOTE — Telephone Encounter (Signed)
Left message to return call 

## 2020-05-24 NOTE — Telephone Encounter (Signed)
Lmtc

## 2020-05-29 ENCOUNTER — Ambulatory Visit: Payer: BC Managed Care – PPO | Admitting: "Endocrinology

## 2020-05-30 NOTE — Telephone Encounter (Signed)
Left message to return call 

## 2020-05-31 ENCOUNTER — Encounter: Payer: Self-pay | Admitting: *Deleted

## 2020-05-31 NOTE — Telephone Encounter (Signed)
Sent pt message on my chart

## 2020-06-05 ENCOUNTER — Ambulatory Visit (INDEPENDENT_AMBULATORY_CARE_PROVIDER_SITE_OTHER): Payer: BC Managed Care – PPO | Admitting: Family Medicine

## 2020-06-05 ENCOUNTER — Encounter: Payer: Self-pay | Admitting: Family Medicine

## 2020-06-05 ENCOUNTER — Other Ambulatory Visit: Payer: Self-pay

## 2020-06-05 VITALS — BP 122/86 | Temp 97.5°F | Wt 177.0 lb

## 2020-06-05 DIAGNOSIS — U071 COVID-19: Secondary | ICD-10-CM

## 2020-06-05 DIAGNOSIS — F4321 Adjustment disorder with depressed mood: Secondary | ICD-10-CM

## 2020-06-05 NOTE — Progress Notes (Signed)
   Subjective:    Patient ID: Cheryl Rowe, female    DOB: 1960/12/13, 59 y.o.   MRN: [id number]  HPI Very nice patient Recently battled Covid She is gradually getting better Does relate a little bit of chest congestion but denies any high fevers Gets very fatigued with activity Run short on energy Patient arrives for a follow up on Covid. Patient states she is feeling much better.  Especially compared to how she was she ran fever for about 7 days before cleared up  Sadly she lost her husband to Covid during the same span of time  Review of Systems See above    Objective:   Physical Exam  Lungs clear respiratory rate normal heart regular pulse normal extremities no edema      Assessment & Plan:  Covid Gradually getting better No need for antibiotics I find no evidence of blood clots The fatigue she is experiencing along with intermittent elevated heart rates are related into the Covid and could last weeks She is going through grieving with the loss of her husband We will send her information regarding counseling that she could reach out to Eventually she may benefit from being part of a grief counseling group Patient states she is not suicidal She does state that if she started feeling that way she will connect with [place] immediately She will also follow-up in 3 to 4 weeks

## 2020-06-11 ENCOUNTER — Telehealth: Payer: Self-pay

## 2020-06-11 NOTE — Telephone Encounter (Signed)
Patient is wanting some information on grief counseling sent to her through the mail. She is having issues with her computer right now. She states you and her talked about this at her last visit.

## 2020-06-11 NOTE — Telephone Encounter (Signed)
Please give information regarding [facility] in [place]. I will do this as a letter

## 2020-06-23 ENCOUNTER — Encounter: Payer: Self-pay | Admitting: Family Medicine

## 2020-06-23 NOTE — Telephone Encounter (Signed)
Letter was completed please mail to patient-I am not certain she does utilize [employer] you

## 2020-06-24 ENCOUNTER — Other Ambulatory Visit: Payer: Self-pay | Admitting: Family Medicine

## 2020-06-28 ENCOUNTER — Encounter (HOSPITAL_COMMUNITY): Payer: Self-pay

## 2020-06-28 ENCOUNTER — Other Ambulatory Visit: Payer: Self-pay

## 2020-06-28 ENCOUNTER — Encounter (HOSPITAL_COMMUNITY)
Admission: RE | Admit: 2020-06-28 | Discharge: 2020-06-28 | Disposition: A | Discharge status: Home / Self Care | Payer: BC Managed Care – PPO | Source: Ambulatory Visit | Attending: Family Medicine | Admitting: Family Medicine

## 2020-06-28 DIAGNOSIS — M810 Age-related osteoporosis without current pathological fracture: Secondary | ICD-10-CM | POA: Diagnosis not present

## 2020-06-28 LAB — POCT I-STAT, CHEM 8
BUN: 11 mg/dL (ref 6–20)
Calcium, Ion: 1.39 mmol/L (ref 1.15–1.40)
Chloride: 105 mmol/L (ref 98–111)
Creatinine, Ser: 0.7 mg/dL (ref 0.44–1.00)
Glucose, Bld: 108 mg/dL — ABNORMAL HIGH (ref 70–99)
HCT: 41 % (ref 36.0–46.0)
Hemoglobin: 13.9 g/dL (ref 12.0–15.0)
Potassium: 4.5 mmol/L (ref 3.5–5.1)
Sodium: 142 mmol/L (ref 135–145)
TCO2: 27 mmol/L (ref 22–32)

## 2020-06-28 MED ORDER — ZOLEDRONIC ACID 5 MG/100ML IV SOLN
5.0000 mg | Freq: Once | INTRAVENOUS | Status: AC
  Administered 2020-06-28: 5 mg via INTRAVENOUS

## 2020-06-28 MED ORDER — SODIUM CHLORIDE 0.9 % IV SOLN: Freq: Once | INTRAVENOUS | Status: AC

## 2020-07-03 ENCOUNTER — Ambulatory Visit: Payer: BC Managed Care – PPO | Admitting: Family Medicine

## 2020-07-15 ENCOUNTER — Ambulatory Visit (INDEPENDENT_AMBULATORY_CARE_PROVIDER_SITE_OTHER): Payer: BC Managed Care – PPO | Admitting: Family Medicine

## 2020-07-15 ENCOUNTER — Other Ambulatory Visit: Payer: Self-pay

## 2020-07-15 ENCOUNTER — Encounter: Payer: Self-pay | Admitting: Family Medicine

## 2020-07-15 VITALS — BP 144/88 | HR 95 | Temp 97.2°F | Ht 64.0 in | Wt 180.0 lb

## 2020-07-15 DIAGNOSIS — M5440 Lumbago with sciatica, unspecified side: Secondary | ICD-10-CM | POA: Diagnosis not present

## 2020-07-15 DIAGNOSIS — G8929 Other chronic pain: Secondary | ICD-10-CM

## 2020-07-15 DIAGNOSIS — F321 Major depressive disorder, single episode, moderate: Secondary | ICD-10-CM

## 2020-07-15 DIAGNOSIS — Z23 Encounter for immunization: Secondary | ICD-10-CM | POA: Diagnosis not present

## 2020-07-15 DIAGNOSIS — I10 Essential (primary) hypertension: Secondary | ICD-10-CM | POA: Diagnosis not present

## 2020-07-15 DIAGNOSIS — Z79891 Long term (current) use of opiate analgesic: Secondary | ICD-10-CM | POA: Diagnosis not present

## 2020-07-15 DIAGNOSIS — K219 Gastro-esophageal reflux disease without esophagitis: Secondary | ICD-10-CM

## 2020-07-15 MED ORDER — LISINOPRIL 2.5 MG PO TABS
2.5000 mg | ORAL_TABLET | Freq: Every day | ORAL | 1 refills | Status: DC
Start: 2020-07-15 — End: 2021-01-14

## 2020-07-15 MED ORDER — OXYCODONE-ACETAMINOPHEN 10-325 MG PO TABS
ORAL_TABLET | ORAL | 0 refills | Status: DC
Start: 2020-07-15 — End: 2020-10-16

## 2020-07-15 MED ORDER — OXYCODONE-ACETAMINOPHEN 10-325 MG PO TABS: ORAL_TABLET | ORAL | 0 refills | Status: DC

## 2020-07-15 MED ORDER — VENLAFAXINE HCL ER 150 MG PO CP24
150.0000 mg | ORAL_CAPSULE | Freq: Every day | ORAL | 1 refills | Status: DC
Start: 2020-07-15 — End: 2021-01-14

## 2020-07-15 MED ORDER — OMEPRAZOLE 40 MG PO CPDR
DELAYED_RELEASE_CAPSULE | ORAL | 1 refills | Status: DC
Start: 2020-07-15 — End: 2020-10-16

## 2020-07-15 NOTE — Progress Notes (Signed)
Subjective:    Patient ID: Cheryl Rowe, female    DOB: 1961/03/13, 59 y.o.   MRN: [id number]  HPI This patient was seen today for chronic pain  The medication list was reviewed and updated.   -Compliance with medication: takes 6 per day  - Number patient states they take daily: 6  -when was the last dose patient took? today  The patient was advised the importance of maintaining medication and not using illegal substances with these.  Here for refills and follow up  The patient was educated that we can provide 3 monthly scripts for their medication, it is their responsibility to follow the instructions.  Side effects or complications from medications: none  Patient is aware that pain medications are meant to minimize the severity of the pain to allow their pain levels to improve to allow for better function. They are aware of that pain medications cannot totally remove their pain.  Due for UDT ( at least once per year) : last one 02/06/20  Scale of 1 to 10 ( 1 is least 10 is most) Your pain level without the medicine: 8 Your pain level with medication 6  Scale 1 to 10 ( 1-helps very little, 10 helps very well) How well does your pain medication reduce your pain so you can function better through out the day? 6  Would like to discuss anxiety and trouble sleeping.   Stopped taking lisinopril a couple of weeks ago.   Would like a flu vaccine today.   Encounter for long-term opiate analgesic use  Need for vaccination - Plan: Flu Vaccine QUAD 6+ mos PF IM (Fluarix Quad PF)  Chronic midline low back pain with sciatica, sciatica laterality unspecified  Depression, major, single episode, moderate (HCC)  Primary hypertension  Gastroesophageal reflux disease without esophagitis        Review of Systems  Constitutional: Positive for fatigue. Negative for activity change and appetite change.  HENT: Negative for congestion and rhinorrhea.   Respiratory: Negative for  cough and shortness of breath.   Cardiovascular: Negative for chest pain and leg swelling.  Gastrointestinal: Negative for abdominal pain and diarrhea.  Endocrine: Negative for polydipsia and polyphagia.  Musculoskeletal: Positive for back pain.  Skin: Negative for color change.  Neurological: Negative for dizziness and weakness.  Psychiatric/Behavioral: Positive for decreased concentration and dysphoric mood. Negative for behavioral problems and confusion.       Objective:   Physical Exam Vitals reviewed.  Constitutional:      General: She is not in acute distress. HENT:     Head: Normocephalic and atraumatic.  Eyes:     General:        Right eye: No discharge.        Left eye: No discharge.  Neck:     Trachea: No tracheal deviation.  Cardiovascular:     Rate and Rhythm: Normal rate and regular rhythm.     Heart sounds: Normal heart sounds. No murmur heard.   Pulmonary:     Effort: Pulmonary effort is normal. No respiratory distress.     Breath sounds: Normal breath sounds.  Lymphadenopathy:     Cervical: No cervical adenopathy.  Skin:    General: Skin is warm and dry.  Neurological:     Mental Status: She is alert.     Coordination: Coordination normal.  Psychiatric:        Behavior: Behavior normal.           Assessment & Plan:  1. Need for vaccination Flu shot today - Flu Vaccine QUAD 6+ mos PF IM (Fluarix Quad PF)  2. Encounter for long-term opiate analgesic use This patient has severe back pain this is related to ongoing back trouble for years with multiple surgeries.  Patient is fused but still has severe pain that is in the back radiates down the legs.  She often has to take pain medicine every 4-5 hours to get relief she takes anywhere between 5 or 6 a day she states it does not cause drowsiness she does not abuse it she has been consistent in the past with urine drug screens as well as when she gets her medicine.  This medication does allow her to  function better without it she would have a very difficult time  The patient was seen in followup for chronic pain. A review over at their current pain status was discussed. Drug registry was checked. Prescriptions were given.  Regular follow-up recommended. Discussion was held regarding the importance of compliance with medication as well as pain medication contract.  Patient was informed that medication may cause drowsiness and should not be combined  with other medications/alcohol or street drugs. If the patient feels medication is causing altered alertness then do not drive or operate dangerous equipment.    3. Chronic midline low back pain with sciatica, sciatica laterality unspecified Patient has chronic low back pain unfortunately further surgery would not help this it is very important for the patient try to do the best can with some mild activity taking her medicine as directed  4. Depression, major, single episode, moderate ([place]) Patient went through the loss of her husband and died of Covid patient is working through this I did send her information to start counseling she will look into this in [place] with [facility].  She will continue her antidepressant she denies being suicidal.  5. Primary hypertension Blood pressure is borderline she stopped taking her lisinopril she will start taking it again 1 daily.  She will follow-up again in 4 to 6 weeks to recheck blood pressure  6. Gastroesophageal reflux disease without esophagitis Continue omeprazole.  Under fair control.

## 2020-07-31 DIAGNOSIS — Z029 Encounter for administrative examinations, unspecified: Secondary | ICD-10-CM

## 2020-08-16 ENCOUNTER — Other Ambulatory Visit: Payer: Self-pay

## 2020-08-16 ENCOUNTER — Encounter: Payer: Self-pay | Admitting: Family Medicine

## 2020-08-16 ENCOUNTER — Ambulatory Visit (INDEPENDENT_AMBULATORY_CARE_PROVIDER_SITE_OTHER): Payer: BC Managed Care – PPO | Admitting: Family Medicine

## 2020-08-16 VITALS — BP 122/82 | HR 93 | Temp 97.1°F | Ht 64.0 in | Wt 177.2 lb

## 2020-08-16 DIAGNOSIS — N6459 Other signs and symptoms in breast: Secondary | ICD-10-CM | POA: Insufficient documentation

## 2020-08-16 NOTE — Progress Notes (Signed)
Patient ID: Cheryl Rowe, female    DOB: 1960/09/20, 58 y.o.   MRN: [id number]   Chief Complaint  Patient presents with  . Breast Pain    Noticed on her nightgown the other night her left nipple had been bleeding and a little sore.    Subjective:  CC: bleeding from left nipple   This is a new problem. Presents today with c/o bleeding from left nipple. She noted bright red blood on her night gown and then she was able to squeeze blood from left nipple. Recent mammogram in June 2021 was normal. Denies fever, chills, chest pain, shortness of breath, changes in skin on breast.     Medical History Cheryl Rowe has a past medical history of ADHD (attention deficit hyperactivity disorder), Arthritis, Depression, Family history of adverse reaction to anesthesia, GERD (gastroesophageal reflux disease), Hyperlipidemia, Migraine, Scoliosis, Spondylolisthesis of lumbosacral region, and Wears glasses.   Outpatient Encounter Medications as of 08/16/2020  Medication Sig  . Cholecalciferol 100 MCG (4000 UT) CAPS Take 4,000 Units by mouth daily at 2 PM.  . Cholecalciferol 50 MCG (2000 UT) CAPS Take 1 capsule (2,000 Units total) by mouth daily with breakfast.  . Flaxseed, Linseed, (FLAX SEED OIL) 1000 MG CAPS Take 1,000 mg by mouth daily.  Marland Kitchen lidocaine (LIDODERM) 5 % PLACE 1 PATCH ONTO THE SKIN DAILY AS NEEDED (PAIN). REMOVE & DISCARD PATCH WITHIN 12 HOURS OR AS DIRECTED BY MD  . lisinopril (ZESTRIL) 2.5 MG tablet Take 1 tablet (2.5 mg total) by mouth daily.  . Multiple Vitamin (MULTIVITAMIN WITH MINERALS) TABS tablet Take 1 tablet by mouth daily.  Marland Kitchen omeprazole (PRILOSEC) 40 MG capsule TAKE 1 CAPSULE BY MOUTH EVERY DAY  . oxyCODONE-acetaminophen (PERCOCET) 10-325 MG tablet Take one tablet by mouth every 4 hours prn pain max 6 per day  . oxyCODONE-acetaminophen (PERCOCET) 10-325 MG tablet 1 every 4 hours as needed pain max 6 per day  . oxyCODONE-acetaminophen (PERCOCET) 10-325 MG tablet Take one tablet every  4 hours prn pain max 6 per day  . SUPER B COMPLEX/C PO Take 1 tablet by mouth daily.  Marland Kitchen venlafaxine XR (EFFEXOR-XR) 150 MG 24 hr capsule Take 1 capsule (150 mg total) by mouth daily with breakfast.  . vitamin E 400 UNIT capsule Take 400 Units by mouth daily.  . Zoledronic Acid (RECLAST IV) Inject 5 mg into the vein once. yearly   No facility-administered encounter medications on file as of 08/16/2020.     Review of Systems  Constitutional: Negative for chills and fever.  HENT: Negative for ear pain.   Respiratory: Negative for shortness of breath.   Cardiovascular: Negative for chest pain.  Gastrointestinal: Negative for abdominal pain.  Skin:       Left breast bleeding on Tuesday from nipple  Hematological: Negative for adenopathy.     Vitals BP 122/82   Pulse 93   Temp (!) 97.1 F (36.2 C) (Oral)   Ht 5\' 4"  (1.626 m)   Wt 177 lb 3.2 oz (80.4 kg)   SpO2 96%   BMI 30.42 kg/m   Objective:   Physical Exam Vitals reviewed.  Constitutional:      General: She is not in acute distress.    Appearance: Normal appearance.  Cardiovascular:     Rate and Rhythm: Normal rate and regular rhythm.     Heart sounds: Normal heart sounds.  Pulmonary:     Effort: Pulmonary effort is normal.     Breath sounds: Normal breath sounds.  Chest:     Chest wall: No mass, swelling or tenderness.  Breasts:     Right: Normal.     Left: Bleeding and nipple discharge present. No swelling, inverted nipple, mass, skin change or tenderness.      Comments: Bleeding from left nipple on Tuesday.  Skin:    General: Skin is warm and dry.  Neurological:     General: No focal deficit present.     Mental Status: She is alert.  Psychiatric:        Behavior: Behavior normal.      Assessment and Plan   1. Bleeding from nipple in female   Goes to [facility] for her screening mammograms. Nurse called this center and they do not do diagnostic mammograms, they will notify Dr. Sherre Lain to  make a referral to their imaging center for a diagnostic mammogram.  Patient informed of this information before leaving the office.   No bleeding noted today. Appears to be a small pinpoint area on left nipple from where blood came from.   Agrees with plan of care discussed today. Understands warning signs to seek further care: any changes to health.  Understands to follow-up with [facility]'s imaging center for diagnostic mammogram (to be handled by her physician, Dr. Sherre Lain). She can let us know if we can be of any assistance with this process.   Cheryl Ades, FNP-C

## 2020-08-26 DIAGNOSIS — N644 Mastodynia: Secondary | ICD-10-CM | POA: Diagnosis not present

## 2020-09-26 ENCOUNTER — Telehealth: Payer: Self-pay | Admitting: Family Medicine

## 2020-09-26 DIAGNOSIS — N6459 Other signs and symptoms in breast: Secondary | ICD-10-CM

## 2020-09-26 NOTE — Telephone Encounter (Signed)
Hi Santiago Glad This is the patient we were speaking of  I will follow-up with her regarding the nipple concern She has a follow-up office visit on 28 January Thanks-Dr. Nicki Reaper

## 2020-09-27 ENCOUNTER — Telehealth: Payer: Self-pay | Admitting: Family Medicine

## 2020-09-27 ENCOUNTER — Telehealth (INDEPENDENT_AMBULATORY_CARE_PROVIDER_SITE_OTHER): Payer: 59 | Admitting: Family Medicine

## 2020-09-27 ENCOUNTER — Encounter: Payer: Self-pay | Admitting: Family Medicine

## 2020-09-27 ENCOUNTER — Other Ambulatory Visit: Payer: Self-pay

## 2020-09-27 VITALS — Ht 64.0 in

## 2020-09-27 DIAGNOSIS — F322 Major depressive disorder, single episode, severe without psychotic features: Secondary | ICD-10-CM

## 2020-09-27 DIAGNOSIS — N6459 Other signs and symptoms in breast: Secondary | ICD-10-CM

## 2020-09-27 MED ORDER — VENLAFAXINE HCL ER 75 MG PO CP24: 75.0000 mg | ORAL_CAPSULE | Freq: Every day | ORAL | 1 refills | Status: DC

## 2020-09-27 NOTE — Telephone Encounter (Signed)
Does pt need this scheduled or do we need to schedule this. I do not see anything in epic but autumn thought she wanted this done through [facility]

## 2020-09-27 NOTE — Progress Notes (Signed)
   Subjective:    Patient ID: Cheryl Rowe, female    DOB: 1960/10/03, 60 y.o.   MRN: [id number]  HPI  Patient arrives to discuss depression Patient thinks her depression medication needs to be increased. Patient with severe depression she finds her self sad blue not suicidal.  Feels very stressed out by the loss of her husband is interested in seeing a counselor.  Patient also had bleeding from the left nipple.  This is been present intermittently over the past several weeks but has not happened recently she was seen she was referred to her gynecologist in order to get proper diagnostic mammo but then she found that her insurance was not accepted there so now she comes back to [place] with this problem Virtual Visit via Telephone Note  I connected with Cheryl Rowe on 09/27/20 at  8:40 AM EST by telephone and verified that I am speaking with the correct person using two identifiers.  Location: Patient: home Provider: office   I discussed the limitations, risks, security and privacy concerns of performing an evaluation and management service by telephone and the availability of in person appointments. I also discussed with the patient that there may be a patient responsible charge related to this service. The patient expressed understanding and agreed to proceed.   History of Present Illness:    Observations/Objective:   Assessment and Plan:   Follow Up Instructions:    I discussed the assessment and treatment plan with the patient. The patient was provided an opportunity to ask questions and all were answered. The patient agreed with the plan and demonstrated an understanding of the instructions.   The patient was advised to call back or seek an in-person evaluation if the symptoms worsen or if the condition fails to improve as anticipated.  I provided 20 minutes of non-face-to-face time during this encounter.    Review of Systems     Objective:   Physical  Exam  Today's visit was via telephone Physical exam was not possible for this visit       Assessment & Plan:  Major depression-bump up the dose of Effexor 225 mg daily follow-up if progressive troubles send patient list of counselors that she could connect with  She will find out if they need a referral if they do that she will let us know then we will certainly set up the referral  As for the bleeding nipple very important for the patient to do a diagnostic mammogram and ultrasound we will help set this up

## 2020-09-27 NOTE — Telephone Encounter (Signed)
Please touch base and make sure she had her diagnostic mammogram.  Is it possible to get a report about it.  Thanks, KD

## 2020-09-27 NOTE — Telephone Encounter (Signed)
Long story short #1 patient will need diagnostic mammogram and ultrasound due to left breast bleeding nipple #2 the place in [place] no longer takes her insurance because her insurance is from [place] only #3 it would be my advice to set this up at the hospital #4 if possible it would be nice to have this completed by the time she has her visit with me in mid February

## 2020-09-27 NOTE — Telephone Encounter (Signed)
FYI-patient seen today, within today's orders will be doing diagnostic mammogram and ultrasound patient already has a follow-up in mid February with me thank you

## 2020-09-27 NOTE — Telephone Encounter (Signed)
Patient needs a list of counselors that she could see for counseling we will help get the list together and send to her

## 2020-09-27 NOTE — Telephone Encounter (Signed)
Pt has appt with provider today. 

## 2020-10-01 NOTE — Addendum Note (Signed)
Addended by: Dairl Ponder on: 10/01/2020 11:43 AM   Modules accepted: Orders

## 2020-10-01 NOTE — Telephone Encounter (Signed)
Diagnostic mammogram and Ultrasound scheduled at [facility] October 22, 2020 at 3:20pm be there at 3pm Patient notified and verbalized understanding.

## 2020-10-01 NOTE — Addendum Note (Signed)
Addended by: Dairl Ponder on: 10/01/2020 10:57 AM   Modules accepted: Orders

## 2020-10-02 NOTE — Telephone Encounter (Signed)
It is fine where it is thanks for the effort

## 2020-10-16 ENCOUNTER — Other Ambulatory Visit: Payer: Self-pay

## 2020-10-16 ENCOUNTER — Ambulatory Visit (INDEPENDENT_AMBULATORY_CARE_PROVIDER_SITE_OTHER): Payer: 59 | Admitting: Family Medicine

## 2020-10-16 ENCOUNTER — Other Ambulatory Visit: Payer: Self-pay | Admitting: Family Medicine

## 2020-10-16 ENCOUNTER — Ambulatory Visit
Admission: RE | Admit: 2020-10-16 | Discharge: 2020-10-16 | Disposition: A | Discharge status: Home / Self Care | Payer: Self-pay | Source: Ambulatory Visit | Attending: Family Medicine | Admitting: Family Medicine

## 2020-10-16 VITALS — BP 128/82 | Temp 97.1°F | Ht 64.0 in | Wt 172.2 lb

## 2020-10-16 DIAGNOSIS — N6459 Other signs and symptoms in breast: Secondary | ICD-10-CM

## 2020-10-16 DIAGNOSIS — M5440 Lumbago with sciatica, unspecified side: Secondary | ICD-10-CM

## 2020-10-16 DIAGNOSIS — G8929 Other chronic pain: Secondary | ICD-10-CM

## 2020-10-16 MED ORDER — OXYCODONE-ACETAMINOPHEN 10-325 MG PO TABS: ORAL_TABLET | ORAL | 0 refills | Status: DC

## 2020-10-16 MED ORDER — PANTOPRAZOLE SODIUM 40 MG PO TBEC: 40.0000 mg | DELAYED_RELEASE_TABLET | Freq: Every day | ORAL | 5 refills | Status: DC

## 2020-10-16 NOTE — Progress Notes (Signed)
Subjective:    Patient ID: Cheryl Rowe, female    DOB: 03/04/1961, 60 y.o.   MRN: [id number]  HPI  This patient was seen today for chronic pain  The medication list was reviewed and updated.   -Compliance with medication: yes  - Number patient states they take daily: 6  -when was the last dose patient took? today  The patient was advised the importance of maintaining medication and not using illegal substances with these.  Here for refills and follow up  The patient was educated that we can provide 3 monthly scripts for their medication, it is their responsibility to follow the instructions.  Side effects or complications from medications: none  Patient is aware that pain medications are meant to minimize the severity of the pain to allow their pain levels to improve to allow for better function. They are aware of that pain medications cannot totally remove their pain.  Due for UDT ( at least once per year) : 02/06/20  Significant stress going on with the loss of her husband she would benefit from counseling she will seek this locally if she needs a referral she will request that continue her antidepressant currently Has significant stress related issues avoid nerve related medication.  Such as Xanax clonazepam   Review of Systems  Constitutional: Negative for activity change, appetite change and fatigue.  HENT: Negative for congestion and rhinorrhea.   Respiratory: Negative for cough and shortness of breath.   Cardiovascular: Negative for chest pain and leg swelling.  Gastrointestinal: Negative for abdominal pain and diarrhea.  Endocrine: Negative for polydipsia and polyphagia.  Musculoskeletal: Positive for arthralgias and back pain.  Skin: Negative for color change.  Neurological: Negative for dizziness and weakness.  Psychiatric/Behavioral: Negative for behavioral problems and confusion.       Objective:   Physical Exam Vitals reviewed.  Constitutional:       General: She is not in acute distress.    Appearance: She is well-nourished.  HENT:     Head: Normocephalic and atraumatic.  Eyes:     General:        Right eye: No discharge.        Left eye: No discharge.  Neck:     Trachea: No tracheal deviation.  Cardiovascular:     Rate and Rhythm: Normal rate and regular rhythm.     Heart sounds: Normal heart sounds. No murmur heard.   Pulmonary:     Effort: Pulmonary effort is normal. No respiratory distress.     Breath sounds: Normal breath sounds.  Musculoskeletal:        General: No edema.  Lymphadenopathy:     Cervical: No cervical adenopathy.  Skin:    General: Skin is warm and dry.  Neurological:     Mental Status: She is alert.     Coordination: Coordination normal.  Psychiatric:        Mood and Affect: Mood and affect normal.        Behavior: Behavior normal.           Assessment & Plan:  She has severe underlying back trouble with previous surgery rod placement limited range of motion would not really benefit from physical therapy she does take her pain medicine 5 or 6 tablets a day to help relieve her pain ideally in the long run we would like to reduce this is been difficult to do so we will discuss this topic again on follow-up.  She will be due  urine drug screen on next visit

## 2020-10-16 NOTE — Telephone Encounter (Signed)
Hand written list was given to the patient

## 2020-10-22 ENCOUNTER — Telehealth: Payer: Self-pay | Admitting: Family Medicine

## 2020-10-22 ENCOUNTER — Ambulatory Visit (HOSPITAL_COMMUNITY): Payer: 59

## 2020-10-22 ENCOUNTER — Ambulatory Visit (HOSPITAL_COMMUNITY)
Admission: RE | Admit: 2020-10-22 | Discharge: 2020-10-22 | Disposition: A | Discharge status: Home / Self Care | Payer: 59 | Source: Ambulatory Visit | Attending: Family Medicine | Admitting: Family Medicine

## 2020-10-22 ENCOUNTER — Other Ambulatory Visit: Payer: Self-pay

## 2020-10-22 DIAGNOSIS — N6459 Other signs and symptoms in breast: Secondary | ICD-10-CM | POA: Insufficient documentation

## 2020-10-22 DIAGNOSIS — F321 Major depressive disorder, single episode, moderate: Secondary | ICD-10-CM

## 2020-10-22 DIAGNOSIS — F4321 Adjustment disorder with depressed mood: Secondary | ICD-10-CM

## 2020-10-22 NOTE — Telephone Encounter (Signed)
Referral ordered in Epic. Patient notified. 

## 2020-10-22 NOTE — Telephone Encounter (Signed)
Please go ahead with referral to behavioral health here in [place] thank you due to severe grief and depression

## 2020-10-22 NOTE — Telephone Encounter (Signed)
Patient is requesting a referral to Behavioral health stating the numbers you gave her for [place] offices couldn't see her until June. Please advise

## 2020-10-30 ENCOUNTER — Encounter: Payer: Self-pay | Admitting: Family Medicine

## 2020-11-04 NOTE — Telephone Encounter (Signed)
Nurses  Certainly sorry to hear Natsuko is having such a rough time  Please have Loma Sousa share with Journee any additional options  Unfortunately currently many places that do counseling are backed up or full  Also if Bethanny finds a place that we will counsel her but needs a referral to please let us know  Please communicate with Sande that we will have Loma Sousa forward additional options  Thanks-Dr. Nicki Reaper

## 2020-11-15 ENCOUNTER — Ambulatory Visit (HOSPITAL_COMMUNITY): Payer: 59 | Admitting: Clinical

## 2020-11-15 ENCOUNTER — Other Ambulatory Visit: Payer: Self-pay

## 2020-12-02 ENCOUNTER — Other Ambulatory Visit: Payer: Self-pay

## 2020-12-02 ENCOUNTER — Ambulatory Visit (INDEPENDENT_AMBULATORY_CARE_PROVIDER_SITE_OTHER): Payer: 59 | Admitting: Clinical

## 2020-12-02 DIAGNOSIS — F322 Major depressive disorder, single episode, severe without psychotic features: Secondary | ICD-10-CM | POA: Diagnosis not present

## 2020-12-02 DIAGNOSIS — Z634 Disappearance and death of family member: Secondary | ICD-10-CM | POA: Diagnosis not present

## 2020-12-02 NOTE — Progress Notes (Signed)
Virtual Visit via Telephone Note  I connected with Cheryl Rowe on 12/02/20 at 11:00 AM EDT by telephone and verified that I am speaking with the correct person using two identifiers.  Location: Patient: Home Provider: Office   I discussed the limitations, risks, security and privacy concerns of performing an evaluation and management service by telephone and the availability of in person appointments. I also discussed with the patient that there may be a patient responsible charge related to this service. The patient expressed understanding and agreed to proceed.      Comprehensive Clinical Assessment (CCA) Note  12/02/2020 Cheryl Rowe [id number]  Chief Complaint: Depression/ Bereavement Visit Diagnosis: Depression/ Bereavement   CCA Screening, Triage and Referral (STR)  Patient Reported Information How did you hear about [place]? No data recorded Referral name: No data recorded Referral phone number: No data recorded  Whom do you see for routine medical problems? No data recorded Practice/Facility Name: No data recorded Practice/Facility Phone Number: No data recorded Name of Contact: No data recorded Contact Number: No data recorded Contact Fax Number: No data recorded Prescriber Name: No data recorded Prescriber Address (if known): No data recorded  What Is the Reason for Your Visit/Call Today? No data recorded How Long Has This Been Causing You Problems? No data recorded What Do You Feel Would Help You the Most Today? No data recorded  Have You Recently Been in Any Inpatient Treatment (Hospital/Detox/Crisis Center/28-Day Program)? No data recorded Name/Location of Program/Hospital:No data recorded How Long Were You There? No data recorded When Were You Discharged? No data recorded  Have You Ever Received Services From [facility] Before? No data recorded Who Do You See at [facility]? No data recorded  Have You Recently Had Any Thoughts About Hurting Yourself?  No data recorded Are You Planning to Commit Suicide/Harm Yourself At This time? No data recorded  Have you Recently Had Thoughts About [place]? No data recorded Explanation: No data recorded  Have You Used Any Alcohol or Drugs in the Past 24 Hours? No data recorded How Long Ago Did You Use Drugs or Alcohol? No data recorded What Did You Use and How Much? No data recorded  Do You Currently Have a Therapist/Psychiatrist? No data recorded Name of Therapist/Psychiatrist: No data recorded  Have You Been Recently Discharged From Any Office Practice or Programs? No data recorded Explanation of Discharge From Practice/Program: No data recorded    CCA Screening Triage Referral Assessment Type of Contact: No data recorded Is this Initial or Reassessment? No data recorded Date Telepsych consult ordered in [facility]:  No data recorded Time Telepsych consult ordered in [facility]:  No data recorded  Patient Reported Information Reviewed? No data recorded Patient Left Without Being Seen? No data recorded Reason for Not Completing Assessment: No data recorded  Collateral Involvement: No data recorded  Does Patient Have a [place]? No data recorded Name and Contact of Legal Guardian: No data recorded If Minor and Not Living with Parent(s), Who has Custody? No data recorded Is CPS involved or ever been involved? No data recorded Is APS involved or ever been involved? No data recorded  Patient Determined To Be At Risk for Harm To Self or Others Based on Review of Patient Reported Information or Presenting Complaint? No data recorded Method: No data recorded Availability of Means: No data recorded Intent: No data recorded Notification Required: No data recorded Additional Information for Danger to Others Potential: No data recorded Additional Comments for  Danger to Others Potential: No data recorded Are There Guns or Other Weapons in [place]? No data recorded Types of  Guns/Weapons: No data recorded Are These Weapons Safely Secured?                            No data recorded Who Could Verify You Are Able To Have These Secured: No data recorded Do You Have any Outstanding Charges, Pending Court Dates, Parole/Probation? No data recorded Contacted To Inform of Risk of Harm To Self or Others: No data recorded  Location of Assessment: No data recorded  Does Patient Present under Involuntary Commitment? No data recorded IVC Papers Initial File Date: No data recorded  [place] of Residence: No data recorded  Patient Currently Receiving the Following Services: No data recorded  Determination of Need: No data recorded  Options For Referral: No data recorded    CCA Biopsychosocial Intake/Chief Complaint:  The patient notes her husband passed away 6 months ago and Mother in law passed away around 1 month ago.  Current Symptoms/Problems: Difficulty with the greaving the loss of her husband and mother in law   Patient Reported Schizophrenia/Schizoaffective Diagnosis in Past: No   Strengths: I dont think i have any right now  Preferences: The patient notes since her husband passed she has moved back in with her parents and just stays in her room  Abilities: None noted   Type of Services Patient Feels are Needed: Medication Therapy (PCP) and Individual Therapy   Initial Clinical Notes/Concerns: The patient notes counseling in 1992/93 when she divorced none since this time. No prior hospitalization for MH. No current S/I or H/I   Mental Health Symptoms Depression:  Hopelessness; Change in energy/activity; Difficulty Concentrating; Fatigue; Irritability; Tearfulness; Sleep (too much or little)   Duration of Depressive symptoms: Greater than two weeks   Mania:  None   Anxiety:   None   Psychosis:  None   Duration of Psychotic symptoms: No data recorded  Trauma:  None   Obsessions:  None   Compulsions:  None   Inattention:  None    Hyperactivity/Impulsivity:  N/A   Oppositional/Defiant Behaviors:  None   Emotional Irregularity:  None   Other Mood/Personality Symptoms:  No Additional    Mental Status Exam Appearance and self-care  Stature:  Average   Weight:  Underweight   Clothing:  Casual   Grooming:  Normal   Cosmetic use:  None   Posture/gait:  Normal   Motor activity:  Not Remarkable   Sensorium  Attention:  Normal   Concentration:  Normal   Orientation:  X5   Recall/memory:  Defective in Short-term   Affect and Mood  Affect:  Appropriate   Mood:  Depressed   Relating  Eye contact:  Normal   Facial expression:  Depressed   Attitude toward examiner:  Cooperative   Thought and Language  Speech flow: Normal   Thought content:  Appropriate to Mood and Circumstances   Preoccupation:  None   Hallucinations:  None   Organization:  Landscape architect of Knowledge:  Good   Intelligence:  Average   Abstraction:  Normal   Judgement:  Good   Reality Testing:  Realistic   Insight:  Good   Decision Making:  Normal   Social Functioning  Social Maturity:  Isolates   Social Judgement:  Normal   Stress  Stressors:  Family conflict; Grief/losses; Housing; Illness; Transitions (Patient  notes since her husband passed she has moved in with her parents which can be difficulty at times, she has medical problems for which she is currently taking pain medication.)   Coping Ability:  Normal   Skill Deficits:  None   Supports:  Family (Parents.)     Religion: Religion/Spirituality Are You A Religious Person?: No How Might This Affect Treatment?: NA  Leisure/Recreation: Leisure / Recreation Do You Have Hobbies?: No  Exercise/Diet: Exercise/Diet Do You Exercise?: No Have You Gained or Lost A Significant Amount of Weight in the Past Six Months?: No Do You Follow a Special Diet?: No Do You Have Any Trouble Sleeping?: Yes Explanation of Sleeping  Difficulties: Difficulty with Falling Asleep as well as staying asleep   CCA Employment/Education Employment/Work Situation: Employment / Work Copywriter, advertising Employment situation: Unemployed Patient's job has been impacted by current illness: Yes Describe how patient's job has been impacted: 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73 and a half What is the longest time patient has a held a job?: 38yrs 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 Has patient ever been in the [employer]?: No  Education: Education Is Patient Currently Attending School?: No Last Grade Completed: 12 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 Did Teacher, adult education From [employer]?: Yes Did Physicist, medical?: Yes What Type of College Degree Do you Have?: Associates Degree in Office Management Did [place]?: No What Was Your Major?: NA Did You Have Any Special Interests In School?: NA Did You Have An Individualized Education Program (IIEP): No Did You Have Any Difficulty At School?: No Patient's Education Has Been Impacted by Current Illness: No   CCA Family/Childhood History Family and Relationship History: Family history Marital status: Widowed Widowed, when?: 2021 Are you sexually active?: No What is your sexual orientation?: Heterosexual Has your sexual activity been affected by drugs, alcohol, medication, or emotional stress?: NA Does patient have children?: No  Childhood History:  Childhood History By whom was/is the patient raised?: Both parents Additional childhood history information: Patient notes she was raised by both parents Description of patient's relationship with caregiver when they were a child: The patient notes- I had a good relationship Patient's description of current relationship with people who raised him/her: The patient notes- I have a good relationship with them now i know they are worried about me and i am crying all the time. How  were you disciplined when you got in trouble as a child/adolescent?: Grounded Does patient have siblings?: Yes Number of Siblings: 1 Description of patient's current relationship with siblings: the patient notes she is close to her sister Did patient suffer any verbal/emotional/physical/sexual abuse as a child?: No Did patient suffer from severe childhood neglect?: No Has patient ever been sexually abused/assaulted/raped as an adolescent or adult?: No Was the patient ever a victim of a crime or a disaster?: No Witnessed domestic violence?: No Has patient been affected by domestic violence as an adult?: No  Child/Adolescent Assessment:     CCA Substance Use Alcohol/Drug Use: Alcohol / Drug Use Pain Medications: See MAR Prescriptions: See MAR Over the Counter: Vitimins History of alcohol / drug use?: No history of alcohol / drug abuse Longest period of sobriety (when/how long): NA                         ASAM's:  Six Dimensions of Multidimensional Assessment  Dimension 1:  Acute Intoxication and/or  Withdrawal Potential:      Dimension 2:  Biomedical Conditions and Complications:      Dimension 3:  Emotional, Behavioral, or Cognitive Conditions and Complications:     Dimension 4:  Readiness to Change:     Dimension 5:  Relapse, Continued use, or Continued Problem Potential:     Dimension 6:  Recovery/Living Environment:     ASAM Severity Score:    ASAM Recommended Level of Treatment:     Substance use Disorder (SUD)    Recommendations for Services/Supports/Treatments: Recommendations for Services/Supports/Treatments Recommendations For Services/Supports/Treatments: Individual Therapy,Medication Management  DSM5 Diagnoses: Patient Active Problem List   Diagnosis Date Noted  . Bleeding from nipple in female 08/16/2020  . Vitamin D deficiency 06/20/2019  . Osteoporosis without current pathological fracture 03/01/2018  . Special screening for malignant  neoplasms, colon 02/03/2018  . Hyperparathyroidism, primary ([place]) 12/29/2017  . Hypercalcemia 11/03/2017  . Spondylolisthesis at L5-S1 level 11/10/2016  . GERD (gastroesophageal reflux disease) 07/15/2015  . Gastroenteritis 04/11/2015  . Dehydration 04/11/2015  . Hypokalemia 04/11/2015  . Abdominal pain   . Hyperlipidemia 12/28/2012  . Chronic lumbar pain 12/28/2012  . Elevated liver enzymes 12/28/2012  . Depression 12/28/2012  . ADD (attention deficit disorder) 12/28/2012    Patient Centered Plan: Patient is on the following Treatment Plan(s): Depression/ Bereavement  Referrals to Alternative Service(s): Referred to Alternative Service(s):   Place:   Date:   Time:    Referred to Alternative Service(s):   Place:   Date:   Time:    Referred to Alternative Service(s):   Place:   Date:   Time:    Referred to Alternative Service(s):   Place:   Date:   Time:     I discussed the assessment and treatment plan with the patient. The patient was provided an opportunity to ask questions and all were answered. The patient agreed with the plan and demonstrated an understanding of the instructions.   The patient was advised to call back or seek an in-person evaluation if the symptoms worsen or if the condition fails to improve as anticipated.  I provided 60 minutes of non-face-to-face time during this encounter.  Lennox Grumbles, LCSW   12/02/2020

## 2020-12-31 ENCOUNTER — Ambulatory Visit (HOSPITAL_COMMUNITY): Payer: 59 | Admitting: Clinical

## 2021-01-14 ENCOUNTER — Ambulatory Visit (INDEPENDENT_AMBULATORY_CARE_PROVIDER_SITE_OTHER): Payer: 59 | Admitting: Family Medicine

## 2021-01-14 ENCOUNTER — Other Ambulatory Visit: Payer: Self-pay

## 2021-01-14 VITALS — BP 138/86 | HR 74 | Temp 97.0°F | Ht 64.0 in | Wt 167.0 lb

## 2021-01-14 DIAGNOSIS — E559 Vitamin D deficiency, unspecified: Secondary | ICD-10-CM | POA: Diagnosis not present

## 2021-01-14 DIAGNOSIS — E7849 Other hyperlipidemia: Secondary | ICD-10-CM | POA: Diagnosis not present

## 2021-01-14 DIAGNOSIS — I10 Essential (primary) hypertension: Secondary | ICD-10-CM | POA: Diagnosis not present

## 2021-01-14 DIAGNOSIS — M546 Pain in thoracic spine: Secondary | ICD-10-CM

## 2021-01-14 DIAGNOSIS — Z79891 Long term (current) use of opiate analgesic: Secondary | ICD-10-CM

## 2021-01-14 MED ORDER — OXYCODONE-ACETAMINOPHEN 10-325 MG PO TABS: ORAL_TABLET | ORAL | 0 refills | Status: DC

## 2021-01-14 MED ORDER — VENLAFAXINE HCL ER 150 MG PO CP24: 150.0000 mg | ORAL_CAPSULE | Freq: Every day | ORAL | 1 refills | Status: DC

## 2021-01-14 MED ORDER — PANTOPRAZOLE SODIUM 40 MG PO TBEC: 40.0000 mg | DELAYED_RELEASE_TABLET | Freq: Every day | ORAL | 5 refills | Status: DC

## 2021-01-14 MED ORDER — LISINOPRIL 2.5 MG PO TABS: 2.5000 mg | ORAL_TABLET | Freq: Every day | ORAL | 1 refills | Status: DC

## 2021-01-14 MED ORDER — VENLAFAXINE HCL ER 75 MG PO CP24: 75.0000 mg | ORAL_CAPSULE | Freq: Every day | ORAL | 1 refills | Status: DC

## 2021-01-14 NOTE — Progress Notes (Signed)
Subjective:    Patient ID: Cheryl Rowe, female    DOB: April 28, 1961, 60 y.o.   MRN: [id number]  HPI  Pain medication refill   This patient was seen today for chronic pain  The medication list was reviewed and updated.  Location of Pain for which the patient has been treated with regarding narcotics: She has midthoracic pain history of chronic low back pain  Onset of this pain: Has had this problem for years   -Compliance with medication: She relates compliance with the medicine  - Number patient states they take daily: Typically asked to take 6/day  -when was the last dose patient took?  Earlier today  The patient was advised the importance of maintaining medication and not using illegal substances with these.  Here for refills and follow up  The patient was educated that we can provide 3 monthly scripts for their medication, it is their responsibility to follow the instructions.  Side effects or complications from medications: She denies side effects to the medication  Patient is aware that pain medications are meant to minimize the severity of the pain to allow their pain levels to improve to allow for better function. They are aware of that pain medications cannot totally remove their pain.  Due for UDT ( at least once per year) : She will be due on her next visit  Scale of 1 to 10 ( 1 is least 10 is most) Your pain level without the medicine: 8 Your pain level with medication 5  Scale 1 to 10 ( 1-helps very little, 10 helps very well) How well does your pain medication reduce your pain so you can function better through out the day?  9  Quality of the pain: Severe burning pain throbbing  Persistence of the pain: Persistent all the time  Modifying factors: Worse with activity   Thoracic spine pain - Plan: VITAMIN D 25 Hydroxy (Vit-D Deficiency, Fractures), Lipid panel, Comprehensive metabolic panel  Vitamin D deficiency - Plan: VITAMIN D 25 Hydroxy (Vit-D  Deficiency, Fractures), Lipid panel, Comprehensive metabolic panel  Primary hypertension - Plan: VITAMIN D 25 Hydroxy (Vit-D Deficiency, Fractures), Lipid panel, Comprehensive metabolic panel  Other hyperlipidemia - Plan: VITAMIN D 25 Hydroxy (Vit-D Deficiency, Fractures), Lipid panel, Comprehensive metabolic panel  Encounter for long-term opiate analgesic use - Plan: VITAMIN D 25 Hydroxy (Vit-D Deficiency, Fractures), Lipid panel, Comprehensive metabolic panel      Review of Systems      Objective:   Physical Exam Vitals reviewed.  Constitutional:      Appearance: She is well-developed.  HENT:     Head: Normocephalic.  Cardiovascular:     Rate and Rhythm: Normal rate and regular rhythm.     Heart sounds: Normal heart sounds. No murmur heard.   Pulmonary:     Effort: Pulmonary effort is normal.     Breath sounds: Normal breath sounds.  Skin:    General: Skin is warm and dry.  Neurological:     Mental Status: She is alert.           Assessment & Plan:  1. Thoracic spine pain Patient has mid thoracic spine she has had previous surgery.  She has to take her pain medicine in order to make it through the day without the pain medicine she would be very debilitated.  Patient otherwise with pain medicine can get about within the house and go outside to walk.  Quality of life is better. The patient was seen  in followup for chronic pain. A review over at their current pain status was discussed. Drug registry was checked. Prescriptions were given.  Regular follow-up recommended. Discussion was held regarding the importance of compliance with medication as well as pain medication contract.  Patient was informed that medication may cause drowsiness and should not be combined  with other medications/alcohol or street drugs. If the patient feels medication is causing altered alertness then do not drive or operate dangerous equipment.  Drug registry checked Patient was counseled  that it would be in her best interest to try to taper down on pain medicine.  I have encouraged her to try to reduce it to 5-1/2 tablets a day if over the next few weeks she is successful then to reduce it to 5 tablets/day - VITAMIN D 25 Hydroxy (Vit-D Deficiency, Fractures) - Lipid panel - Comprehensive metabolic panel  2. Vitamin D deficiency We will go ahead with the vitamin D level she has had low vitamin D in the past and has a history of osteopenia - VITAMIN D 25 Hydroxy (Vit-D Deficiency, Fractures) - Lipid panel - Comprehensive metabolic panel  3. Primary hypertension Blood pressure under decent control but elevated currently - VITAMIN D 25 Hydroxy (Vit-D Deficiency, Fractures) - Lipid panel - Comprehensive metabolic panel  4. Other hyperlipidemia Hyperlipidemia in the past check lipid profile - VITAMIN D 25 Hydroxy (Vit-D Deficiency, Fractures) - Lipid panel - Comprehensive metabolic panel  5. Encounter for long-term opiate analgesic use Please see above Pain medicine was sent Patient should try to gradually taper down dose toward 5 tablets/day  - VITAMIN D 25 Hydroxy (Vit-D Deficiency, Fractures) - Lipid panel - Comprehensive metabolic panel

## 2021-01-23 ENCOUNTER — Ambulatory Visit (HOSPITAL_COMMUNITY): Payer: 59 | Admitting: Clinical

## 2021-02-25 ENCOUNTER — Other Ambulatory Visit (HOSPITAL_COMMUNITY): Payer: Self-pay | Admitting: Family Medicine

## 2021-02-25 DIAGNOSIS — Z1231 Encounter for screening mammogram for malignant neoplasm of breast: Secondary | ICD-10-CM

## 2021-03-06 ENCOUNTER — Ambulatory Visit (HOSPITAL_COMMUNITY)
Admission: RE | Admit: 2021-03-06 | Discharge: 2021-03-06 | Disposition: A | Discharge status: Home / Self Care | Payer: 59 | Source: Ambulatory Visit | Attending: Family Medicine | Admitting: Family Medicine

## 2021-03-06 ENCOUNTER — Other Ambulatory Visit: Payer: Self-pay

## 2021-03-06 DIAGNOSIS — Z1231 Encounter for screening mammogram for malignant neoplasm of breast: Secondary | ICD-10-CM | POA: Diagnosis present

## 2021-03-12 ENCOUNTER — Telehealth: Payer: Self-pay | Admitting: Family Medicine

## 2021-03-12 NOTE — Telephone Encounter (Signed)
Fax from [facility] for [place]. They are needing to know if patient needs to continue Reclast. Please advise. Thank you

## 2021-03-19 NOTE — Telephone Encounter (Signed)
According to Dr. Dorris Fetch she should continue to do this on a yearly basis, she had bone density last year and saw Dr. Dorris Fetch last year

## 2021-03-20 NOTE — Telephone Encounter (Signed)
Will need to call insurance. Insurance faxed over denial. Fax from insurance in red folder for injections with her paper.

## 2021-04-02 ENCOUNTER — Encounter: Payer: Self-pay | Admitting: Family Medicine

## 2021-04-02 ENCOUNTER — Telehealth: Payer: Self-pay | Admitting: Family Medicine

## 2021-04-02 NOTE — Telephone Encounter (Signed)
Nurses Please see form Her denial of her osteoporosis infusion is being appealed Hopefully it will get approved According to the letter that was forwarded to [place] providers or members may request reconsideration  Please submit the letter that I dictated today as well as her most recent bone density scan. Thanks-Dr. Nicki Reaper

## 2021-04-03 NOTE — Telephone Encounter (Signed)
Copied into previous message regarding infusion

## 2021-04-03 NOTE — Telephone Encounter (Signed)
Per Dr.Scott 04/02/21 Nurses Please see form Her denial of her osteoporosis infusion is being appealed Hopefully it will get approved According to the letter that was forwarded to [place] providers or members may request reconsideration   Please submit the letter that I dictated today as well as her most recent bone density scan. Thanks-Dr. Nicki Reaper

## 2021-04-07 NOTE — Telephone Encounter (Signed)
Recent bone density test and letter faxed to insurance company.

## 2021-04-15 LAB — COMPREHENSIVE METABOLIC PANEL
ALT: 14 IU/L (ref 0–32)
AST: 20 IU/L (ref 0–40)
Albumin/Globulin Ratio: 2.3 — ABNORMAL HIGH (ref 1.2–2.2)
Albumin: 5.2 g/dL — ABNORMAL HIGH (ref 3.8–4.9)
Alkaline Phosphatase: 97 IU/L (ref 44–121)
BUN/Creatinine Ratio: 19 (ref 9–23)
BUN: 14 mg/dL (ref 6–24)
Bilirubin Total: 0.4 mg/dL (ref 0.0–1.2)
CO2: 21 mmol/L (ref 20–29)
Calcium: 10.8 mg/dL — ABNORMAL HIGH (ref 8.7–10.2)
Chloride: 102 mmol/L (ref 96–106)
Creatinine, Ser: 0.75 mg/dL (ref 0.57–1.00)
Globulin, Total: 2.3 g/dL (ref 1.5–4.5)
Glucose: 104 mg/dL — ABNORMAL HIGH (ref 65–99)
Potassium: 4.1 mmol/L (ref 3.5–5.2)
Sodium: 144 mmol/L (ref 134–144)
Total Protein: 7.5 g/dL (ref 6.0–8.5)
eGFR: 92 mL/min/{1.73_m2} (ref >59)

## 2021-04-15 LAB — LIPID PANEL
Chol/HDL Ratio: 3.5 ratio (ref 0.0–4.4)
Cholesterol, Total: 284 mg/dL — ABNORMAL HIGH (ref 100–199)
HDL: 81 mg/dL (ref >39)
LDL Chol Calc (NIH): 179 mg/dL — ABNORMAL HIGH (ref 0–99)
Triglycerides: 136 mg/dL (ref 0–149)
VLDL Cholesterol Cal: 24 mg/dL (ref 5–40)

## 2021-04-15 LAB — VITAMIN D 25 HYDROXY (VIT D DEFICIENCY, FRACTURES): Vit D, 25-Hydroxy: 21.6 ng/mL — ABNORMAL LOW (ref 30.0–100.0)

## 2021-04-17 ENCOUNTER — Ambulatory Visit (INDEPENDENT_AMBULATORY_CARE_PROVIDER_SITE_OTHER): Payer: 59 | Admitting: Family Medicine

## 2021-04-17 ENCOUNTER — Other Ambulatory Visit: Payer: Self-pay

## 2021-04-17 VITALS — BP 138/84 | HR 84 | Ht 64.0 in | Wt 160.8 lb

## 2021-04-17 DIAGNOSIS — E559 Vitamin D deficiency, unspecified: Secondary | ICD-10-CM

## 2021-04-17 DIAGNOSIS — E210 Primary hyperparathyroidism: Secondary | ICD-10-CM

## 2021-04-17 DIAGNOSIS — Z79891 Long term (current) use of opiate analgesic: Secondary | ICD-10-CM

## 2021-04-17 DIAGNOSIS — E7849 Other hyperlipidemia: Secondary | ICD-10-CM | POA: Diagnosis not present

## 2021-04-17 MED ORDER — VITAMIN D (ERGOCALCIFEROL) 1.25 MG (50000 UNIT) PO CAPS: 50000.0000 [IU] | ORAL_CAPSULE | ORAL | 1 refills | Status: DC

## 2021-04-17 MED ORDER — OXYCODONE-ACETAMINOPHEN 10-325 MG PO TABS: ORAL_TABLET | ORAL | 0 refills | Status: DC

## 2021-04-17 NOTE — Patient Instructions (Signed)
We will work on setting you up with endocrinology Dr. Dorris Fetch  Take the vitamin D supplement 50,000 units once weekly for the next 8 weeks then after that 4000 units vitamin D orally  We will see you back in 3 months  Please do your blood work at the start of October

## 2021-04-17 NOTE — Progress Notes (Signed)
   Subjective:    Patient ID: Cheryl Rowe, female    DOB: [birth year], 60 y.o.   MRN: [id number]  HPI This patient was seen today for chronic pain  The medication list was reviewed and updated.  Location of Pain for which the patient has been treated with regarding narcotics: back pain  Onset of this pain: years   -Compliance with medication: yes  - Number patient states they take daily: 6 a day  -when was the last dose patient took? today  The patient was advised the importance of maintaining medication and not using illegal substances with these. Currently lives with her parents and helps them She lost her husband to [place] last year Patient trying to work through the grieving process She is very active at her parents house and it causes a lot of back pain but medicine does help her function Here for refills and follow up  The patient was educated that we can provide 3 monthly scripts for their medication, it is their responsibility to follow the instructions.  Side effects or complications from medications: none  Patient is aware that pain medications are meant to minimize the severity of the pain to allow their pain levels to improve to allow for better function. They are aware of that pain medications cannot totally remove their pain.  Due for UDT ( at least once per year) : 01/2020- today   Patient states pain medicine does help her function.  She denies abusing it.  She states without pain medicine she would not be able to help her parents    Review of Systems     Objective:   Physical Exam  General-in no acute distress Eyes-no discharge Lungs-respiratory rate normal, CTA CV-no murmurs,RRR Extremities skin warm dry no edema Neuro grossly normal Behavior normal, alert       Assessment & Plan:  1. Other hyperlipidemia Cholesterol significantly elevated but has plenty of the good cholesterol which protects her against heart disease no statin indicated  currently  2. Hypercalcemia More than likely due to the hyperparathyroidism referral back to endocrinology.  Patient to do lab work within 2 months  3. Vitamin D deficiency 50,000 unit vitamin D once weekly for the next 8 weeks then 4000 units orally daily  4. Hyperparathyroidism, primary ([place]) Will refer back to Dr. Dorris Fetch.  Will need to check her insurance to make sure he is on her plan.  5. Encounter for long-term opiate analgesic use The patient was seen in followup for chronic pain. A review over at their current pain status was discussed. Drug registry was checked. Prescriptions were given.  Regular follow-up recommended. Discussion was held regarding the importance of compliance with medication as well as pain medication contract.  Patient was informed that medication may cause drowsiness and should not be combined  with other medications/alcohol or street drugs. If the patient feels medication is causing altered alertness then do not drive or operate dangerous equipment.  3 prescriptions were sent in Patient does benefit from the medicine Without it she would have a hard time functioning

## 2021-04-21 LAB — TOXASSURE SELECT 13 (MW), URINE

## 2021-04-22 ENCOUNTER — Other Ambulatory Visit: Payer: Self-pay | Admitting: "Endocrinology

## 2021-04-22 ENCOUNTER — Telehealth: Payer: Self-pay

## 2021-04-22 NOTE — Telephone Encounter (Signed)
Dr Wolfgang Phoenix would like this patient to be seen again, She has not seen you since last year. What labs would you like for her to complete? He is asking for her to be seen for Hypercalcemia, Vitamin D Deficiency and Hyperparathyroidism

## 2021-04-22 NOTE — Telephone Encounter (Signed)
Called pt no answer was unable to lvm, she needs to complete DR Lukings labs and make an appt with [place] as a f/u

## 2021-04-24 NOTE — Telephone Encounter (Signed)
I believe the insurance company figured out this issue I do not think it needs any further interjections thanks

## 2021-04-29 ENCOUNTER — Telehealth: Payer: Self-pay | Admitting: Family Medicine

## 2021-04-29 NOTE — Telephone Encounter (Signed)
Her referral to endocrinology was due to hypercalcemia  As for the osteoporosis and the Reclast I would recommend that we connect with her insurance company to get the dates to include the end of October so that when the 1 year is up they can schedule it and have it covered (Seems silly that the insurance company does not take that into account)

## 2021-04-29 NOTE — Telephone Encounter (Signed)
Patient states she needs to have the reclast done before 06/10/2021 per her insurance she is scheduled for November 1 would like [place] to move it up so her insurance will pay.  CB# [phone]

## 2021-04-29 NOTE — Telephone Encounter (Signed)
Left message to return call [facility] day surgery scheduling

## 2021-05-01 NOTE — Telephone Encounter (Signed)
Request for extension of prior authorization form was completed and faxed to insurance.

## 2021-05-07 NOTE — Telephone Encounter (Signed)
Approval received from insurance company and faxed to hospital

## 2021-05-12 ENCOUNTER — Ambulatory Visit: Payer: 59 | Admitting: "Endocrinology

## 2021-05-13 LAB — BASIC METABOLIC PANEL
BUN/Creatinine Ratio: 21 (ref 9–23)
BUN: 14 mg/dL (ref 6–24)
CO2: 23 mmol/L (ref 20–29)
Calcium: 10 mg/dL (ref 8.7–10.2)
Chloride: 103 mmol/L (ref 96–106)
Creatinine, Ser: 0.66 mg/dL (ref 0.57–1.00)
Glucose: 84 mg/dL (ref 65–99)
Potassium: 4.1 mmol/L (ref 3.5–5.2)
Sodium: 140 mmol/L (ref 134–144)
eGFR: 101 mL/min/{1.73_m2} (ref >59)

## 2021-05-13 LAB — VITAMIN D 25 HYDROXY (VIT D DEFICIENCY, FRACTURES): Vit D, 25-Hydroxy: 34.1 ng/mL (ref 30.0–100.0)

## 2021-05-13 LAB — PARATHYROID HORMONE, INTACT (NO CA): PTH: 87 pg/mL — ABNORMAL HIGH (ref 15–65)

## 2021-05-19 ENCOUNTER — Encounter: Payer: Self-pay | Admitting: "Endocrinology

## 2021-05-19 ENCOUNTER — Ambulatory Visit (INDEPENDENT_AMBULATORY_CARE_PROVIDER_SITE_OTHER): Payer: 59 | Admitting: "Endocrinology

## 2021-05-19 ENCOUNTER — Other Ambulatory Visit: Payer: Self-pay

## 2021-05-19 VITALS — BP 138/86 | HR 68 | Ht 64.0 in | Wt 160.4 lb

## 2021-05-19 DIAGNOSIS — E210 Primary hyperparathyroidism: Secondary | ICD-10-CM | POA: Diagnosis not present

## 2021-05-19 DIAGNOSIS — E559 Vitamin D deficiency, unspecified: Secondary | ICD-10-CM | POA: Diagnosis not present

## 2021-05-19 DIAGNOSIS — M810 Age-related osteoporosis without current pathological fracture: Secondary | ICD-10-CM

## 2021-05-19 NOTE — Progress Notes (Signed)
05/19/2021, 5:48 PM   Endocrinology follow-up note  Cheryl Rowe is a 60 y.o.-year-old female, referred by her PCP, Dr. Wolfgang Phoenix for evaluation for hypercalcemia/mild primary hyperparathyroidism.  She also has osteoporosis on ongoing Reclast treatment. Past Medical History:  Diagnosis Date   ADHD (attention deficit hyperactivity disorder)    Arthritis    Depression    Family history of adverse reaction to anesthesia    sister has PONV   GERD (gastroesophageal reflux disease)    Hyperlipidemia    Migraine    Scoliosis    Teenage   Spondylolisthesis of lumbosacral region    Wears glasses     Past Surgical History:  Procedure Laterality Date   CERVICAL ABLATION     CHOLECYSTECTOMY     COLONOSCOPY WITH PROPOFOL N/A 03/25/2018   Procedure: COLONOSCOPY WITH PROPOFOL;  Surgeon: Rogene Houston, MD;  [facility];  Service: Endoscopy;  Laterality: N/A;  9:30   POLYPECTOMY  03/25/2018   Procedure: POLYPECTOMY;  Surgeon: Rogene Houston, MD;  Location: [facility];  Service: Endoscopy;;  hepatic flexure   TONSILLECTOMY      Social History   Tobacco Use   Smoking status: Never   Smokeless tobacco: Never  Vaping Use   Vaping Use: Never used  Substance Use Topics   Alcohol use: No   Drug use: No    Outpatient Encounter Medications as of 05/19/2021  Medication Sig   Cholecalciferol 50 MCG (2000 UT) CAPS Take 1 capsule (2,000 Units total) by mouth daily with breakfast.   Flaxseed, Linseed, (FLAX SEED OIL) 1000 MG CAPS Take 1,000 mg by mouth daily. (Patient not taking: Reported on 05/19/2021)   lidocaine (LIDODERM) 5 % PLACE 1 PATCH ONTO THE SKIN DAILY AS NEEDED (PAIN). REMOVE & DISCARD PATCH WITHIN 12 HOURS OR AS DIRECTED BY MD   lisinopril (ZESTRIL) 2.5 MG tablet Take 1 tablet (2.5 mg total) by mouth daily.   Multiple Vitamin (MULTIVITAMIN WITH MINERALS) TABS tablet Take 1 tablet by mouth daily. (Patient not  taking: Reported on 05/19/2021)   oxyCODONE-acetaminophen (PERCOCET) 10-325 MG tablet Take one tablet by mouth every 4 hours prn pain max 6 per day   pantoprazole (PROTONIX) 40 MG tablet Take 1 tablet (40 mg total) by mouth daily.   SUPER B COMPLEX/C PO Take 1 tablet by mouth daily. (Patient not taking: Reported on 05/19/2021)   venlafaxine XR (EFFEXOR-XR) 150 MG 24 hr capsule Take 1 capsule (150 mg total) by mouth daily with breakfast.   vitamin E 400 UNIT capsule Take 400 Units by mouth daily. (Patient not taking: Reported on 05/19/2021)   Zoledronic Acid (RECLAST IV) Inject 5 mg into the vein once. yearly   [DISCONTINUED] oxyCODONE-acetaminophen (PERCOCET) 10-325 MG tablet 1 every 4 hours as needed pain max 6 per day   [DISCONTINUED] oxyCODONE-acetaminophen (PERCOCET) 10-325 MG tablet Take one tablet every 4 hours prn pain max 6 per day   [DISCONTINUED] Vitamin D, Ergocalciferol, (DRISDOL) 1.25 MG (50000 UNIT) CAPS capsule Take 1 capsule (50,000 Units total) by mouth every 7 (seven) days.   No facility-administered encounter medications on file as of 05/19/2021.    Allergies  Allergen Reactions  Pravastatin     Legs ache   Cymbalta [Duloxetine Hcl] Other (See Comments)    Aggressive      HPI  Cheryl Rowe was diagnosed with mild primary hyperparathyroidism with intermittent mild hypercalcemia associated with high PTH.   She returns for follow-up.  She is only on observation management.  Her previsit labs show calcium at 10/PTH at 34.  -She was also found to have osteoporosis with femoral neck T score of -3.1, tried on Fosamax which she did not tolerate.   -She is status post 3 injections of IV Reclast, waiting for her4th injection in November 2022.   No interval falls/fractures.  Her bone density in 2021 showed slight worsening of osteoporosis in the left hip.  This study was done on a different machine which did not include the spine.  She is currently on vitamin D 2000 units  daily supplement.  She is not on calcium supplement.   -  She has spondylolisthesis of lumbosacral spine planning electively for orthopedic surgery.  No prior history of fragility fractures or falls. No history of  kidney stones.   -She underwent bone density on December 15, 2017 which showed osteoporosis in spine, and hips with a T score of -2.3 and -3.1 respectively.      She is 10 years postmenopausal.  She has lost about 7 pounds since last visit.  She is grieving the death of her husband from [place].    -She has normal renal function. she is not on HCTZ or other thiazide therapy.  Patient takes multiple supplements of vitamins and elements including calcium/magnesium/zinc and vitamin D.  Did so for several years.   she does not have a family history of hypercalcemia, pituitary tumors, thyroid cancer, or osteoporosis.   I reviewed her chart and she also has a history of multinodular goiter status post biopsy of right-sided thyroid nodule.  FNA results are reviewed showing benign follicular nodule in September 2017.     ROS: Limited as above.  PE: BP 138/86   Pulse 68   Ht _0  (1.626 m)   Wt 160 lb 6.4 oz (72.8 kg)   BMI 27.53 kg/m  Wt Readings from Last 3 Encounters:  05/19/21 160 lb 6.4 oz (72.8 kg)  04/17/21 160 lb 12.8 oz (72.9 kg)  01/14/21 167 lb (75.8 kg)    CMP     Component Value Date/Time   NA 140 05/12/2021 1045   K 4.1 05/12/2021 1045   CL 103 05/12/2021 1045   CO2 23 05/12/2021 1045   GLUCOSE 84 05/12/2021 1045   GLUCOSE 108 (H) 06/28/2020 1309   BUN 14 05/12/2021 1045   CREATININE 0.66 05/12/2021 1045   CALCIUM 10.0 05/12/2021 1045   PROT 7.5 04/14/2021 0920   ALBUMIN 5.2 (H) 04/14/2021 0920   AST 20 04/14/2021 0920   ALT 14 04/14/2021 0920   ALKPHOS 97 04/14/2021 0920   BILITOT 0.4 04/14/2021 0920   GFRNONAA 97 04/26/2020 1158   GFRAA 112 04/26/2020 1158     Lipid Panel ( most recent) Lipid Panel     Component Value Date/Time   CHOL 284 (H)  04/14/2021 0920   TRIG 136 04/14/2021 0920   HDL 81 04/14/2021 0920   CHOLHDL 3.5 04/14/2021 0920   CHOLHDL 4.0 03/15/2014 1259   VLDL 40 03/15/2014 1259   LDLCALC 179 (H) 04/14/2021 0920      Lab Results  Component Value Date   TSH 1.790 12/06/2017   TSH 1.080  09/27/2017   TSH 0.249 (L) 08/27/2017   TSH 1.050 04/22/2016   FREET4 1.33 12/06/2017   FREET4 1.07 09/27/2017   FREET4 0.79 (L) 04/22/2016    Recent Results (from the past 2160 hour(s))  VITAMIN D 25 Hydroxy (Vit-D Deficiency, Fractures)     Status: Abnormal   Collection Time: 04/14/21  9:20 AM  Result Value Ref Range   Vit D, 25-Hydroxy 21.6 (L) 30.0 - 100.0 ng/mL    Comment: Vitamin D deficiency has been defined by the [place] practice guideline as a level of serum 25-OH vitamin D less than 20 ng/mL (1,2). The Endocrine Society went on to further define vitamin D insufficiency as a level between 21 and 29 ng/mL (2). 1. IOM (Institute of Medicine). 2010. Dietary reference    intakes for calcium and D. [place]: The    [employer]. 2. Holick MF, Binkley [place], Bischoff-Ferrari HA, et al.    Evaluation, treatment, and prevention of vitamin D    deficiency: an Endocrine Society clinical practice    guideline. JCEM. 2011 Jul; 96(7):1911-30.   Lipid panel     Status: Abnormal   Collection Time: 04/14/21  9:20 AM  Result Value Ref Range   Cholesterol, Total 284 (H) 100 - 199 mg/dL   Triglycerides 136 0 - 149 mg/dL   HDL 81 >39 mg/dL   VLDL Cholesterol Cal 24 5 - 40 mg/dL   LDL Chol Calc (NIH) 179 (H) 0 - 99 mg/dL   Chol/HDL Ratio 3.5 0.0 - 4.4 ratio    Comment:                                   T. Chol/HDL Ratio                                             Men  Women                               1/2 Avg.Risk  3.4    3.3                                   Avg.Risk  5.0    4.4                                2X Avg.Risk  9.6    7.1                                 3X Avg.Risk 23.4   11.0   Comprehensive metabolic panel     Status: Abnormal   Collection Time: 04/14/21  9:20 AM  Result Value Ref Range   Glucose 104 (H) 65 - 99 mg/dL   BUN 14 6 - 24 mg/dL   Creatinine, Ser 0.75 0.57 - 1.00 mg/dL   eGFR 92 >59 mL/min/1.73   BUN/Creatinine Ratio 19 9 - 23   Sodium 144 134 - 144 mmol/L   Potassium 4.1 3.5 - 5.2 mmol/L   Chloride 102 96 - 106  mmol/L   CO2 21 20 - 29 mmol/L   Calcium 10.8 (H) 8.7 - 10.2 mg/dL   Total Protein 7.5 6.0 - 8.5 g/dL   Albumin 5.2 (H) 3.8 - 4.9 g/dL   Globulin, Total 2.3 1.5 - 4.5 g/dL   Albumin/Globulin Ratio 2.3 (H) 1.2 - 2.2   Bilirubin Total 0.4 0.0 - 1.2 mg/dL   Alkaline Phosphatase 97 44 - 121 IU/L   AST 20 0 - 40 IU/L   ALT 14 0 - 32 IU/L  ToxASSURE Select 13 (MW), Urine     Status: None   Collection Time: 04/17/21 12:00 AM  Result Value Ref Range   Summary Note     Comment: ==================================================================== ToxASSURE Select 13 (MW) ==================================================================== Test                             Result       Flag       Units  Drug Present and Declared for Prescription Verification   Oxycodone                      3577         EXPECTED   ng/mg creat   Oxymorphone                    1999         EXPECTED   ng/mg creat   Noroxycodone                   >4274        EXPECTED   ng/mg creat   Noroxymorphone                 459          EXPECTED   ng/mg creat    Sources of oxycodone are scheduled prescription medications.    Oxymorphone, noroxycodone, and noroxymorphone are expected    metabolites of oxycodone. Oxymorphone is also available as a    scheduled prescription medication.  Drug Present not Declared for Prescription Verification   Carboxy-THC                    >427         UNEXPECTED ng/mg creat    Carboxy-THC is a metabolite of tetrahydrocannabinol (THC). Source o f    THC is most commonly herbal marijuana or marijuana-based  products,    but THC is also present in a scheduled prescription medication.    Trace amounts of THC can be present in hemp and cannabidiol (CBD)    products. This test is not intended to distinguish between delta-9-    tetrahydrocannabinol, the predominant form of THC in most herbal or    marijuana-based products, and delta-8-tetrahydrocannabinol.  ==================================================================== Test                      Result    Flag   Units      Ref Range   Creatinine              234              mg/dL      >=20 ==================================================================== Declared Medications:  The flagging and interpretation on this report are based on the  following declared medications.  Unexpected results may arise from  inaccuracies in the declared medications.   **Note: The testing scope  of this panel includes these medications:   Oxycodone   **Note: The testing [place] o[place] of this panel does not include the  following reported medications:   Venlafaxine (Effexor) ==================================================================== For clinical consultation, please call [phone]. ====================================================================   Parathyroid hormone, intact (no Ca)     Status: Abnormal   Collection Time: 05/12/21 10:45 AM  Result Value Ref Range   PTH 87 (H) 15 - 65 pg/mL  Basic metabolic panel     Status: None   Collection Time: 05/12/21 10:45 AM  Result Value Ref Range   Glucose 84 65 - 99 mg/dL   BUN 14 6 - 24 mg/dL   Creatinine, Ser 0.66 0.57 - 1.00 mg/dL   eGFR 101 >59 mL/min/1.73   BUN/Creatinine Ratio 21 9 - 23   Sodium 140 134 - 144 mmol/L   Potassium 4.1 3.5 - 5.2 mmol/L   Chloride 103 96 - 106 mmol/L   CO2 23 20 - 29 mmol/L   Calcium 10.0 8.7 - 10.2 mg/dL  VITAMIN D 25 Hydroxy (Vit-D Deficiency, Fractures)     Status: None   Collection Time: 05/12/21 10:45 AM  Result Value Ref Range   Vit D,  25-Hydroxy 34.1 30.0 - 100.0 ng/mL    Comment: Vitamin D deficiency has been defined by the [place] and an Endocrine Society practice guideline as a level of serum 25-OH vitamin D less than 20 ng/mL (1,2). The Endocrine Society went on to further define vitamin D insufficiency as a level between 21 and 29 ng/mL (2). 1. IOM (Institute of Medicine). 2010. Dietary reference    intakes for calcium and D. [place]: The    [employer]. 2. Holick MF, Binkley [place], Bischoff-Ferrari HA, et al.    Evaluation, treatment, and prevention of vitamin D    deficiency: an Endocrine Society clinical practice    guideline. JCEM. 2011 Jul; 96(7):1911-30.      Assessment: 1. 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  Osteoporosis 3.  Multinodular goiter-status post fine-needle aspiration in 2017 4.  Vitamin D  deficiency  Plan: -Her most recent labs show calcium stable at 10 with PTH ranging from 47-87.  Given her normal calcium, she will not need Sensipar nor parathyroidectomy.  She is not a surgical candidate, would be continued observation regarding primary hyperparathyroidism. She has osteoporosis. -She has osteoporosis of the hips and osteopenia of the spine.    She did not tolerate alendronate which necessitated switching to IV Reclast.  She is status post her third infusion in October 2021, waiting for her for the infusion in November 2022.  This treatment is being facilitated by Dr. Wolfgang Phoenix.  She is encouraged to maintain that appointment.   -She is now vitamin D replete, however we will continue to benefit from maintenance intervention with D3 2000 units daily. No history of  nephrolithiasis, fragility fractures. No abdominal pain, no major mood disorders, no bone pain.   24-hour urine calcium measurements revealed low calcium of 25, previously 77 mg in 24 hours.  -She will be considered for repeat bone density in September 2023.   -Regarding her  multinodular goiter :  - she is status post fine-needle aspiration of the right-sided thyroid lobe nodule in September 1027 with benign follicular adenoma . She does not require surgical intervention at this time.   She did have normal thyroid function test in January 2019, will have full profile thyroid function test before her next visit in 6 months.  She  is advised to maintain close follow-up with her PMD.  I spent 33 minutes in the care of the patient today including review of labs from Thyroid Function, CMP, and other relevant labs ; imaging/biopsy records (current and previous including abstractions from other facilities); face-to-face time discussing  her lab results and symptoms, medications doses, her options of short and long term treatment based on the latest standards of care / guidelines;   and documenting the encounter.  [name]  participated in the discussions, expressed understanding, and voiced agreement with the above plans.  All questions were answered to her satisfaction. she is encouraged to contact clinic should she have any questions or concerns prior to her return visit.    - Return in about 6 months (around 11/16/2021), or Reclast in November 2022, for F/U with Pre-visit Labs.   Glade Lloyd, MD [facility] [address] [place], [place] [address] Phone: [phone]  Fax: [phone]    This note was partially dictated with voice recognition software. Similar sounding words can be transcribed inadequately or may not  be corrected upon review.  05/19/2021, 5:48 PM

## 2021-05-25 ENCOUNTER — Other Ambulatory Visit: Payer: Self-pay | Admitting: Family Medicine

## 2021-05-28 ENCOUNTER — Ambulatory Visit: Payer: 59 | Admitting: "Endocrinology

## 2021-06-21 ENCOUNTER — Other Ambulatory Visit: Payer: Self-pay | Admitting: Family Medicine

## 2021-06-30 ENCOUNTER — Encounter (HOSPITAL_COMMUNITY): Payer: BC Managed Care – PPO

## 2021-07-01 ENCOUNTER — Encounter (HOSPITAL_COMMUNITY): Admission: RE | Admit: 2021-07-01 | Payer: 59 | Source: Ambulatory Visit

## 2021-07-02 ENCOUNTER — Other Ambulatory Visit: Payer: Self-pay

## 2021-07-02 ENCOUNTER — Encounter (HOSPITAL_COMMUNITY): Payer: Self-pay

## 2021-07-02 ENCOUNTER — Encounter (HOSPITAL_COMMUNITY)
Admission: RE | Admit: 2021-07-02 | Discharge: 2021-07-02 | Disposition: A | Discharge status: Home / Self Care | Payer: 59 | Source: Ambulatory Visit | Attending: Family Medicine | Admitting: Family Medicine

## 2021-07-02 DIAGNOSIS — M810 Age-related osteoporosis without current pathological fracture: Secondary | ICD-10-CM | POA: Diagnosis not present

## 2021-07-02 MED ORDER — ZOLEDRONIC ACID 5 MG/100ML IV SOLN
5.0000 mg | Freq: Once | INTRAVENOUS | Status: AC
  Administered 2021-07-02: 5 mg via INTRAVENOUS

## 2021-07-02 MED ORDER — SODIUM CHLORIDE 0.9 % IV SOLN: INTRAVENOUS | Status: DC

## 2021-07-02 MED ORDER — ZOLEDRONIC ACID 5 MG/100ML IV SOLN
INTRAVENOUS | Status: AC
  Filled 2021-07-02: qty 100

## 2021-07-10 ENCOUNTER — Ambulatory Visit (INDEPENDENT_AMBULATORY_CARE_PROVIDER_SITE_OTHER): Payer: 59 | Admitting: Family Medicine

## 2021-07-10 ENCOUNTER — Encounter: Payer: Self-pay | Admitting: Family Medicine

## 2021-07-10 ENCOUNTER — Other Ambulatory Visit: Payer: Self-pay

## 2021-07-10 VITALS — BP 158/86 | Temp 96.6°F | Wt 149.2 lb

## 2021-07-10 DIAGNOSIS — Z23 Encounter for immunization: Secondary | ICD-10-CM

## 2021-07-10 DIAGNOSIS — Z79891 Long term (current) use of opiate analgesic: Secondary | ICD-10-CM

## 2021-07-10 DIAGNOSIS — I10 Essential (primary) hypertension: Secondary | ICD-10-CM

## 2021-07-10 DIAGNOSIS — Z79899 Other long term (current) drug therapy: Secondary | ICD-10-CM | POA: Diagnosis not present

## 2021-07-10 DIAGNOSIS — E7849 Other hyperlipidemia: Secondary | ICD-10-CM

## 2021-07-10 HISTORY — DX: Essential (primary) hypertension: I10

## 2021-07-10 MED ORDER — LISINOPRIL 5 MG PO TABS: 5.0000 mg | ORAL_TABLET | Freq: Every day | ORAL | 1 refills | Status: DC

## 2021-07-10 MED ORDER — LISINOPRIL 5 MG PO TABS: 5.0000 mg | ORAL_TABLET | Freq: Every day | ORAL | 4 refills | Status: DC

## 2021-07-10 NOTE — Progress Notes (Signed)
Subjective:    Patient ID: Cheryl Rowe, female    DOB: 1960/12/11, 60 y.o.   MRN: [id number]  HPI This patient was seen today for chronic pain  The medication list was reviewed and updated.  Location of Pain for which the patient has been treated with regarding narcotics:   Onset of this pain:    -Compliance with medication: Oxycodone 10-325  - Number patient states they take daily: 6  -when was the last dose patient took? This morning at 8 am  The patient was advised the importance of maintaining medication and not using illegal substances with these.  Here for refills and follow up  The patient was educated that we can provide 3 monthly scripts for their medication, it is their responsibility to follow the instructions.  Side effects or complications from medications: none  Patient is aware that pain medications are meant to minimize the severity of the pain to allow their pain levels to improve to allow for better function. They are aware of that pain medications cannot totally remove their pain.  Due for UDT ( at least once per year) : completed 04/17/21  Scale of 1 to 10 ( 1 is least 10 is most) Your pain level without the medicine: 8 Your pain level with medication: 5  Scale 1 to 10 ( 1-helps very little, 10 helps very well) How well does your pain medication reduce your pain so you can function better through out the day? 7  Quality of the pain: She has constant back pain burning into the upper legs, she describes it as a throbbing in the back  Persistence of the pain: Constantly 24 hours a day  Modifying factors: Worse with activity  Pt having issues with blood pressure. Pt states she took BP the other night and it was 196/111; having some headaches also. No other symptoms.   Need for vaccination - Plan: Flu Vaccine QUAD 6+ mos PF IM (Fluarix Quad PF)  Other hyperlipidemia - Plan: Basic Metabolic Panel (BMET), Lipid Profile  Primary hypertension - Plan:  Basic Metabolic Panel (BMET), Lipid Profile  Encounter for long-term opiate analgesic use  High risk medication use - Plan: Basic Metabolic Panel (BMET), Lipid Profile      Review of Systems     Objective:   Physical Exam  General-in no acute distress Eyes-no discharge Lungs-respiratory rate normal, CTA CV-no murmurs,RRR Extremities skin warm dry no edema Neuro grossly normal Behavior normal, alert       Assessment & Plan:  1. Need for vaccination Flu shot today - Flu Vaccine QUAD 6+ mos PF IM (Fluarix Quad PF)  2. Other hyperlipidemia Check cholesterol profile - Basic Metabolic Panel (BMET) - Lipid Profile  3. Primary hypertension Adjust lisinopril follow-up again in approximately 2 to 3 weeks bring her blood pressure cuff with her may need an additional adjustments - Basic Metabolic Panel (BMET) - Lipid Profile  4. Encounter for long-term opiate analgesic use The patient was seen in followup for chronic pain. A review over at their current pain status was discussed. Drug registry was checked. Prescriptions were given.  Regular follow-up recommended. Discussion was held regarding the importance of compliance with medication as well as pain medication contract.  Patient was informed that medication may cause drowsiness and should not be combined  with other medications/alcohol or street drugs. If the patient feels medication is causing altered alertness then do not drive or operate dangerous equipment. Very important for her to do the  best she can at trying to taper down on the medicine her goal is to go down to 5 between now and the next visit and then to 4 after that  5. High risk medication use See above - Basic Metabolic Panel (BMET) - Lipid Profile

## 2021-07-10 NOTE — Patient Instructions (Signed)

## 2021-07-16 MED ORDER — OXYCODONE-ACETAMINOPHEN 10-325 MG PO TABS: ORAL_TABLET | ORAL | 0 refills | Status: DC

## 2021-07-18 ENCOUNTER — Ambulatory Visit: Payer: 59 | Admitting: Family Medicine

## 2021-07-30 ENCOUNTER — Other Ambulatory Visit: Payer: Self-pay

## 2021-07-30 ENCOUNTER — Ambulatory Visit (INDEPENDENT_AMBULATORY_CARE_PROVIDER_SITE_OTHER): Payer: 59 | Admitting: Family Medicine

## 2021-07-30 ENCOUNTER — Encounter: Payer: Self-pay | Admitting: Family Medicine

## 2021-07-30 VITALS — BP 182/94 | Temp 96.4°F | Wt 148.2 lb

## 2021-07-30 DIAGNOSIS — I10 Essential (primary) hypertension: Secondary | ICD-10-CM | POA: Diagnosis not present

## 2021-07-30 MED ORDER — VALSARTAN 160 MG PO TABS: 160.0000 mg | ORAL_TABLET | Freq: Every day | ORAL | 3 refills | Status: DC

## 2021-07-30 NOTE — Progress Notes (Signed)
   Subjective:    Patient ID: Cheryl Rowe, female    DOB: [birth year], 60 y.o.   MRN: [id number]  HPI Pt here to follow up on blood pressure. Pt states she has began to have headaches. Taking blood pressures daily. Pt had lab work done this morning. Taking Lisinopril 5 mg daily.  Pt did bring readings and cuff to visit.  Patient states patient states that her blood pressure has been significantly elevated giving her headaches denies any numbness tingling nausea or vomiting Review of Systems     Objective:   Physical Exam  General-in no acute distress Eyes-no discharge Lungs-respiratory rate normal, CTA CV-no murmurs,RRR Extremities skin warm dry no edema Neuro grossly normal Behavior normal, alert       Assessment & Plan:  Elevated blood pressure Will change medication at this point Stop lisinopril Start valsartan 160 mg Patient is send [place] blood pressure readings over the next week and follow-up within 2 weeks May need additional medicine perhaps amlodipine If progressive troubles high fevers or worse immediately ER

## 2021-07-30 NOTE — Patient Instructions (Signed)

## 2021-07-31 LAB — BASIC METABOLIC PANEL
BUN/Creatinine Ratio: 28 — ABNORMAL HIGH (ref 9–23)
BUN: 18 mg/dL (ref 6–24)
CO2: 24 mmol/L (ref 20–29)
Calcium: 10.7 mg/dL — ABNORMAL HIGH (ref 8.7–10.2)
Chloride: 104 mmol/L (ref 96–106)
Creatinine, Ser: 0.64 mg/dL (ref 0.57–1.00)
Glucose: 98 mg/dL (ref 70–99)
Potassium: 4.3 mmol/L (ref 3.5–5.2)
Sodium: 143 mmol/L (ref 134–144)
eGFR: 102 mL/min/{1.73_m2} (ref >59)

## 2021-07-31 LAB — LIPID PANEL
Chol/HDL Ratio: 3 ratio (ref 0.0–4.4)
Cholesterol, Total: 211 mg/dL — ABNORMAL HIGH (ref 100–199)
HDL: 70 mg/dL (ref >39)
LDL Chol Calc (NIH): 120 mg/dL — ABNORMAL HIGH (ref 0–99)
Triglycerides: 119 mg/dL (ref 0–149)
VLDL Cholesterol Cal: 21 mg/dL (ref 5–40)

## 2021-08-13 ENCOUNTER — Ambulatory Visit (INDEPENDENT_AMBULATORY_CARE_PROVIDER_SITE_OTHER): Payer: 59 | Admitting: Family Medicine

## 2021-08-13 ENCOUNTER — Other Ambulatory Visit: Payer: Self-pay

## 2021-08-13 VITALS — BP 138/82 | HR 57 | Temp 99.1°F | Ht 64.0 in | Wt 142.8 lb

## 2021-08-13 DIAGNOSIS — I10 Essential (primary) hypertension: Secondary | ICD-10-CM | POA: Diagnosis not present

## 2021-08-13 MED ORDER — OXYCODONE-ACETAMINOPHEN 10-325 MG PO TABS: ORAL_TABLET | ORAL | 0 refills | Status: DC

## 2021-08-13 NOTE — Progress Notes (Signed)
° °  Subjective:    Patient ID: Cheryl Rowe, female    DOB: February 09, 1961, 60 y.o.   MRN: [id number]  Hypertension The current episode started more than 1 month ago. (stress)  Patient is here for follow up for hypertension. Medication makes patient extremely tired. She finds her self feeling low energy when she stands up  Review of Systems     Objective:   Physical Exam General-in no acute distress Eyes-no discharge Lungs-respiratory rate normal, CTA CV-no murmurs,RRR Extremities skin warm dry no edema Neuro grossly normal Behavior normal, alert  Blood pressure 138/82 when she stands 106/64      Assessment & Plan:  The patient was seen in followup for chronic pain. A review over at their current pain status was discussed. Drug registry was checked. Prescriptions were given.  Regular follow-up recommended. Discussion was held regarding the importance of compliance with medication as well as pain medication contract.  Patient was informed that medication may cause drowsiness and should not be combined  with other medications/alcohol or street drugs. If the patient feels medication is causing altered alertness then do not drive or operate dangerous equipment.  Should be noted that the patient appears to be meeting appropriate use of opioids and response.  Evidenced by improved function and decent pain control without significant side effects and no evidence of overt aberrancy issues.  Upon discussion with the patient today they understand that opioid therapy is optional and they feel that the pain has been refractory to reasonable conservative measures and is significant and affecting quality of life enough to warrant ongoing therapy and wishes to continue opioids.  Refills were provided.  It is in her best interest to taper down on pain medicine I recommend she work her way down to 5/day and on follow-up we will consider dropping it to 4/day depending on how she is doing  Blood  pressure does drop dramatically with standing Recommend decreasing valsartan to 80 mg daily

## 2021-08-13 NOTE — Patient Instructions (Signed)
Valsartan- reduce to one half a day Recheck here in 4 to 5 weeks Call or send a message if problems  Pain meds try to do 5 per day instead of [place] your Christmas goes as well as possible [facility]-Dr. Nicki Reaper

## 2021-09-22 ENCOUNTER — Ambulatory Visit (INDEPENDENT_AMBULATORY_CARE_PROVIDER_SITE_OTHER): Payer: 59 | Admitting: Family Medicine

## 2021-09-22 ENCOUNTER — Other Ambulatory Visit: Payer: Self-pay

## 2021-09-22 VITALS — BP 122/86 | HR 89 | Temp 97.9°F | Ht 64.0 in | Wt 146.8 lb

## 2021-09-22 DIAGNOSIS — I10 Essential (primary) hypertension: Secondary | ICD-10-CM

## 2021-09-22 DIAGNOSIS — Z79891 Long term (current) use of opiate analgesic: Secondary | ICD-10-CM | POA: Diagnosis not present

## 2021-09-22 DIAGNOSIS — E7849 Other hyperlipidemia: Secondary | ICD-10-CM

## 2021-09-22 MED ORDER — OXYCODONE-ACETAMINOPHEN 10-325 MG PO TABS: ORAL_TABLET | ORAL | 0 refills | Status: DC

## 2021-09-22 NOTE — Progress Notes (Signed)
Subjective:    Patient ID: Cheryl Rowe, female    DOB: 1961-04-11, 61 y.o.   MRN: [id number] Very nice patient HPI Patient is here for follow up on hypertension and back pain. No problems or concerns.  This patient was seen today for chronic pain  The medication list was reviewed and updated.  Location of Pain for which the patient has been treated with regarding narcotics: Low to mid back  Onset of this pain: She has had this for years has had surgery which was not successful   -Compliance with medication: She takes anywhere between 5 and 6/day, compliant  - Number patient states they take daily: 5 to 6/day  -when was the last dose patient took?  Earlier today  The patient was advised the importance of maintaining medication and not using illegal substances with these.  Here for refills and follow up  The patient was educated that we can provide 3 monthly scripts for their medication, it is their responsibility to follow the instructions.  Side effects or complications from medications: She denies any side effects  Patient is aware that pain medications are meant to minimize the severity of the pain to allow their pain levels to improve to allow for better function. They are aware of that pain medications cannot totally remove their pain.  Due for UDT ( at least once per year) : She is already had it this year will be doing again later this year  Scale of 1 to 10 ( 1 is least 10 is most) Your pain level without the medicine: 7-8 Your pain level with medication 3-4  Scale 1 to 10 ( 1-helps very little, 10 helps very well) How well does your pain medication reduce your pain so you can function better through out the day?  7  Quality of the pain: Burning aching throbbing  Persistence of the pain: Present all the time  Modifying factors: Worse with activity  Patient for blood pressure check up.  The patient does have hypertension.    Medication compliance-takes her  medicine  Blood pressure control recently-she believes is under good control  Dietary compliance-watches the salt in her diet  Moods are doing well depression under good control stress doing better lost her husband a while back     Review of Systems     Objective:   Physical Exam General-in no acute distress Eyes-no discharge Lungs-respiratory rate normal, CTA CV-no murmurs,RRR Extremities skin warm dry no edema Neuro grossly normal Behavior normal, alert        Assessment & Plan:  1. Primary hypertension Blood pressure good control continue current measures watch diet - Basic Metabolic Panel (BMET)  2. Other hyperlipidemia Check cholesterol.  Healthy diet recommended - Lipid Profile - Basic Metabolic Panel (BMET)  3. Encounter for long-term opiate analgesic use The patient was seen in followup for chronic pain. A review over at their current pain status was discussed. Drug registry was checked. Prescriptions were given.  Regular follow-up recommended. Discussion was held regarding the importance of compliance with medication as well as pain medication contract.  Patient was informed that medication may cause drowsiness and should not be combined  with other medications/alcohol or street drugs. If the patient feels medication is causing altered alertness then do not drive or operate dangerous equipment.  Should be noted that the patient appears to be meeting appropriate use of opioids and response.  Evidenced by improved function and decent pain control without significant side effects  and no evidence of overt aberrancy issues.  Upon discussion with the patient today they understand that opioid therapy is optional and they feel that the pain has been refractory to reasonable conservative measures and is significant and affecting quality of life enough to warrant ongoing therapy and wishes to continue opioids.  Refills were provided.  The goal is to taper down on pain  medicine to be on less medicine we will go from 170 down to 165 then down to 160 and then down 155 she is interested in trying to get down to 120 not quite sure if she can get there but we will try  Follow-up 3 months - Basic Metabolic Panel (BMET)

## 2021-09-29 ENCOUNTER — Telehealth: Payer: Self-pay | Admitting: Family Medicine

## 2021-09-29 NOTE — Telephone Encounter (Signed)
Pt called and stated she is almost out of her pain meds and needs a refill. [facility]. Also she stated the pharmacy told her that our office needed to call the ins company and let them know why she is taking it since it is a new ins co. [employer]. [phone]

## 2021-09-30 ENCOUNTER — Other Ambulatory Visit: Payer: Self-pay | Admitting: Family Medicine

## 2021-09-30 NOTE — Telephone Encounter (Signed)
Oxycodone approved 09/29/21-03/29/22- Patient notified.

## 2021-10-06 ENCOUNTER — Other Ambulatory Visit: Payer: Self-pay | Admitting: Family Medicine

## 2021-10-22 DIAGNOSIS — Z029 Encounter for administrative examinations, unspecified: Secondary | ICD-10-CM

## 2021-11-13 DIAGNOSIS — E7849 Other hyperlipidemia: Secondary | ICD-10-CM | POA: Diagnosis not present

## 2021-11-13 DIAGNOSIS — I10 Essential (primary) hypertension: Secondary | ICD-10-CM | POA: Diagnosis not present

## 2021-11-13 DIAGNOSIS — Z79891 Long term (current) use of opiate analgesic: Secondary | ICD-10-CM | POA: Diagnosis not present

## 2021-11-14 LAB — BASIC METABOLIC PANEL
BUN/Creatinine Ratio: 25 — ABNORMAL HIGH (ref 9–23)
BUN: 21 mg/dL (ref 6–24)
CO2: 30 mmol/L — ABNORMAL HIGH (ref 20–29)
Calcium: 10.9 mg/dL — ABNORMAL HIGH (ref 8.7–10.2)
Chloride: 102 mmol/L (ref 96–106)
Creatinine, Ser: 0.84 mg/dL (ref 0.57–1.00)
Glucose: 86 mg/dL (ref 70–99)
Potassium: 4.7 mmol/L (ref 3.5–5.2)
Sodium: 143 mmol/L (ref 134–144)
eGFR: 80 mL/min/{1.73_m2} (ref >59)

## 2021-11-14 LAB — LIPID PANEL
Chol/HDL Ratio: 2.8 ratio (ref 0.0–4.4)
Cholesterol, Total: 258 mg/dL — ABNORMAL HIGH (ref 100–199)
HDL: 91 mg/dL (ref >39)
LDL Chol Calc (NIH): 149 mg/dL — ABNORMAL HIGH (ref 0–99)
Triglycerides: 108 mg/dL (ref 0–149)
VLDL Cholesterol Cal: 18 mg/dL (ref 5–40)

## 2021-11-14 LAB — PTH, INTACT AND CALCIUM
Calcium: 11.1 mg/dL — ABNORMAL HIGH (ref 8.7–10.2)
PTH: 55 pg/mL (ref 15–65)

## 2021-11-14 LAB — T4, FREE: Free T4: 1 ng/dL (ref 0.82–1.77)

## 2021-11-14 LAB — TSH: TSH: 0.759 u[IU]/mL (ref 0.450–4.500)

## 2021-11-14 LAB — VITAMIN D 25 HYDROXY (VIT D DEFICIENCY, FRACTURES): Vit D, 25-Hydroxy: 33.5 ng/mL (ref 30.0–100.0)

## 2021-11-17 ENCOUNTER — Other Ambulatory Visit: Payer: Self-pay

## 2021-11-17 ENCOUNTER — Encounter: Payer: Self-pay | Admitting: "Endocrinology

## 2021-11-17 ENCOUNTER — Ambulatory Visit (INDEPENDENT_AMBULATORY_CARE_PROVIDER_SITE_OTHER): Payer: 59 | Admitting: "Endocrinology

## 2021-11-17 VITALS — BP 152/90 | HR 80 | Ht 64.0 in | Wt 151.4 lb

## 2021-11-17 DIAGNOSIS — M810 Age-related osteoporosis without current pathological fracture: Secondary | ICD-10-CM | POA: Diagnosis not present

## 2021-11-17 DIAGNOSIS — E042 Nontoxic multinodular goiter: Secondary | ICD-10-CM

## 2021-11-17 DIAGNOSIS — E210 Primary hyperparathyroidism: Secondary | ICD-10-CM

## 2021-11-17 DIAGNOSIS — E559 Vitamin D deficiency, unspecified: Secondary | ICD-10-CM

## 2021-11-17 NOTE — Progress Notes (Signed)
?                                                ?  ?    11/17/2021, 7:20 PM ? ? ?Endocrinology follow-up note ? ?Cheryl Rowe is a 61 y.o.-year-old female, referred by her PCP, Dr. Wolfgang Rowe for evaluation for hypercalcemia/mild primary hyperparathyroidism.  She also has osteoporosis on ongoing Reclast treatment. ?Past Medical History:  ?Diagnosis Date  ? ADHD (attention deficit hyperactivity disorder)   ? Arthritis   ? Depression   ? Family history of adverse reaction to anesthesia   ? sister has PONV  ? GERD (gastroesophageal reflux disease)   ? HTN (hypertension) 07/10/2021  ? Hyperlipidemia   ? Migraine   ? Scoliosis   ? Teenage  ? Spondylolisthesis of lumbosacral region   ? Wears glasses   ? ? ?Past Surgical History:  ?Procedure Laterality Date  ? CERVICAL ABLATION    ? CHOLECYSTECTOMY    ? COLONOSCOPY WITH PROPOFOL N/A 03/25/2018  ? Procedure: COLONOSCOPY WITH PROPOFOL;  Surgeon: Cheryl Houston, MD;  [facility]: [facility];  Service: Endoscopy;  Laterality: N/A;  9:30  ? POLYPECTOMY  03/25/2018  ? Procedure: POLYPECTOMY;  Surgeon: Cheryl Houston, MD;  Location: [facility];  Service: Endoscopy;;  hepatic flexure  ? TONSILLECTOMY    ? ? ?Social History  ? ?Tobacco Use  ? Smoking status: Never  ? Smokeless tobacco: Never  ?Vaping Use  ? Vaping Use: Never used  ?Substance Use Topics  ? Alcohol use: No  ? Drug use: No  ? ? ?Outpatient Encounter Medications as of 11/17/2021  ?Medication Sig  ? Flaxseed, Linseed, (FLAX SEED OIL) 1000 MG CAPS Take 1,000 mg by mouth daily.  ? Multiple Vitamin (MULTIVITAMIN WITH MINERALS) TABS tablet Take 1 tablet by mouth daily.  ? oxyCODONE-acetaminophen (PERCOCET) 10-325 MG tablet Take one tablet by mouth every 4 hours prn pain max 6 per day  ? oxyCODONE-acetaminophen (PERCOCET) 10-325 MG tablet Take one tablet po every 4 hours prn pain max 6 per day  ? oxyCODONE-acetaminophen (PERCOCET) 10-325 MG tablet Take one tablet po every 4 hours prn pain max 6 per day  ? pantoprazole  (PROTONIX) 40 MG tablet TAKE 1 TABLET BY MOUTH EVERY DAY - STOP OMEPRAZOLE  ? SUPER B COMPLEX/C PO Take 1 tablet by mouth daily.  ? valsartan (DIOVAN) 160 MG tablet Take 1 tablet (160 mg total) by mouth daily. (Patient taking differently: Take 80 mg by mouth daily.)  ? venlafaxine XR (EFFEXOR-XR) 150 MG 24 hr capsule Take 1 capsule (150 mg total) by mouth daily with breakfast.  ? vitamin E 400 UNIT capsule Take 400 Units by mouth daily.  ? Zoledronic Acid (RECLAST IV) Inject 5 mg into the vein once. yearly  ? [DISCONTINUED] Cholecalciferol 50 MCG (2000 UT) CAPS Take 1 capsule (2,000 Units total) by mouth daily with breakfast.  ? ?No facility-administered encounter medications on file as of 11/17/2021.  ? ? ?Allergies  ?Allergen Reactions  ? Pravastatin   ?  Legs ache  ? Cymbalta [Duloxetine Hcl] Other (See Comments)  ?  Aggressive ?  ? ? ? ?HPI  ?Cheryl Rowe was diagnosed with mild primary hyperparathyroidism with intermittent mild hypercalcemia associated with high PTH.   ?She returns for follow-up.  She is only on observation management.  Her previsit  labs show calcium slightly increasing to 11.1 while PTH drops to 55.  Previously her calcium was 10 with PTH of 87.  She is known to have osteoporosis on Reclast IV infusion yearly.  Her last infusion was in October 2022-status post 4 injections.  A trial of oral Fosamax was not tolerable for her.   ?-Review of her last bone density showed femoral neck T score of -3.1.  She is due for her next bone density. ? ?  No interval falls/fractures.  Her bone density in 2021 showed slight worsening of osteoporosis in the left hip.  This study was done on a different machine which did not include the spine. ? ?She is currently on vitamin D 2000 units daily supplement.  She is not on calcium supplement.  ? ?-  She has spondylolisthesis of lumbosacral spine planning electively for orthopedic surgery. ? ?No prior history of fragility fractures or falls. No history of  kidney  stones.   ? ? ? She is 10 years postmenopausal.  She has lost about 7 pounds since last visit.  She is grieving the death of her husband from [place].   ? ?-She has normal renal function. ?she is not on HCTZ or other thiazide therapy.  Patient takes multiple supplements of vitamins and elements including calcium/magnesium/zinc and vitamin D.  Did so for several years.  ? ?she does not have a family history of hypercalcemia, pituitary tumors, thyroid cancer, or osteoporosis.  ? ?I reviewed her chart and she also has a history of multinodular goiter status post biopsy of right-sided thyroid nodule.  FNA results are reviewed showing benign follicular nodule in September 2017.   ? ? ?ROS: ?Limited as above. ? ?PE: ?BP (!) 152/90   Pulse 80   Ht '5\' 4"'  (1.626 m)   Wt 151 lb 6.4 oz (68.7 kg)   BMI 25.99 kg/m?  ?Wt Readings from Last 3 Encounters:  ?11/17/21 151 lb 6.4 oz (68.7 kg)  ?09/22/21 146 lb 12.8 oz (66.6 kg)  ?08/13/21 142 lb 12.8 oz (64.8 kg)  ? ? ?CMP  ?   ?Component Value Date/Time  ? NA 143 11/13/2021 0901  ? K 4.7 11/13/2021 0901  ? CL 102 11/13/2021 0901  ? CO2 30 (H) 11/13/2021 0901  ? GLUCOSE 86 11/13/2021 0901  ? GLUCOSE 108 (H) 06/28/2020 1309  ? BUN 21 11/13/2021 0901  ? CREATININE 0.84 11/13/2021 0901  ? CALCIUM 11.1 (H) 11/13/2021 0905  ? PROT 7.5 04/14/2021 0920  ? ALBUMIN 5.2 (H) 04/14/2021 0920  ? AST 20 04/14/2021 0920  ? ALT 14 04/14/2021 0920  ? ALKPHOS 97 04/14/2021 0920  ? BILITOT 0.4 04/14/2021 0920  ? GFRNONAA 97 04/26/2020 1158  ? GFRAA 112 04/26/2020 1158  ? ? ? Lipid Panel ( most recent) ?Lipid Panel  ?   ?Component Value Date/Time  ? CHOL 258 (H) 11/13/2021 0901  ? TRIG 108 11/13/2021 0901  ? HDL 91 11/13/2021 0901  ? CHOLHDL 2.8 11/13/2021 0901  ? CHOLHDL 4.0 03/15/2014 1259  ? VLDL 40 03/15/2014 1259  ? [place] 149 (H) 11/13/2021 0901  ? ?  ? ?Lab Results  ?Component Value Date  ? TSH 0.759 11/13/2021  ? TSH 1.790 12/06/2017  ? TSH 1.080 09/27/2017  ? TSH 0.249 (L) 08/27/2017  ? TSH  1.050 04/22/2016  ? FREET4 1.00 11/13/2021  ? FREET4 1.33 12/06/2017  ? FREET4 1.07 09/27/2017  ? FREET4 0.79 (L) 04/22/2016  ?  ?Recent Results (from the past 2160  hour(s))  ?Lipid Profile     Status: Abnormal  ? Collection Time: 11/13/21  9:01 AM  ?Result Value Ref Range  ? Cholesterol, Total 258 (H) 100 - 199 mg/dL  ? Triglycerides 108 0 - 149 mg/dL  ? HDL 91 >39 mg/dL  ? VLDL Cholesterol Cal 18 5 - 40 mg/dL  ? LDL Chol Calc (NIH) 149 (H) 0 - 99 mg/dL  ? Chol/HDL Ratio 2.8 0.0 - 4.4 ratio  ?  Comment:                                   T. Chol/HDL Ratio ?                                            Men  Women ?                              1/2 Avg.Risk  3.4    3.3 ?                                  Avg.Risk  5.0    4.4 ?                               2X Avg.Risk  9.6    7.1 ?                               3X Avg.Risk 23.4   11.0 ?  ?Basic Metabolic Panel (BMET)     Status: Abnormal  ? Collection Time: 11/13/21  9:01 AM  ?Result Value Ref Range  ? Glucose 86 70 - 99 mg/dL  ? BUN 21 6 - 24 mg/dL  ? Creatinine, Ser 0.84 0.57 - 1.00 mg/dL  ? eGFR 80 >59 mL/min/1.73  ? BUN/Creatinine Ratio 25 (H) 9 - 23  ? Sodium 143 134 - 144 mmol/L  ? Potassium 4.7 3.5 - 5.2 mmol/L  ? Chloride 102 96 - 106 mmol/L  ? CO2 30 (H) 20 - 29 mmol/L  ? Calcium 10.9 (H) 8.7 - 10.2 mg/dL  ?TSH     Status: None  ? Collection Time: 11/13/21  9:05 AM  ?Result Value Ref Range  ? TSH 0.759 0.450 - 4.500 uIU/mL  ?T4, free     Status: None  ? Collection Time: 11/13/21  9:05 AM  ?Result Value Ref Range  ? Free T4 1.00 0.82 - 1.77 ng/dL  ?PTH, intact and calcium     Status: Abnormal  ? Collection Time: 11/13/21  9:05 AM  ?Result Value Ref Range  ? Calcium 11.1 (H) 8.7 - 10.2 mg/dL  ? PTH 55 15 - 65 pg/mL  ? PTH Interp Comment   ?  Comment: Interpretation                 Intact PTH    Calcium ?                                (pg/mL)      (  mg/dL) ?Normal                          15 - 65     8.6 - 10.2 ?Primary Hyperparathyroidism         >65           >10.2 ?Secondary Hyperparathyroidism       >65          <10.2 ?Non-Parathyroid Hypercalcemia       <65          >10.2 ?Hypoparathyroidism                  <15          < 8.6 ?Non-Parathyroid Hypocalcemia    15 -

## 2021-11-19 ENCOUNTER — Encounter: Payer: Self-pay | Admitting: Family Medicine

## 2021-11-19 NOTE — Telephone Encounter (Signed)
Based upon her elevated blood pressure at the endocrinologist office I would recommend 160 mg Diovan daily.  Valsartan is the generic ? ?She should keep follow-up visit in April.  Follow-up sooner if any problems. ?

## 2021-11-30 ENCOUNTER — Encounter: Payer: Self-pay | Admitting: Family Medicine

## 2021-12-01 ENCOUNTER — Telehealth: Payer: Self-pay | Admitting: Family Medicine

## 2021-12-01 NOTE — Telephone Encounter (Signed)
[facility] and [facility] stated it was a PA message they had to override since there is a prior approval on file till July 2023 They stated they will fill script for patient. Patient notified. ?

## 2021-12-01 NOTE — Telephone Encounter (Signed)
Patient called about pain medication refills she states insurance is questioning quantity of pills and denied her refill at [facility] please advise ?

## 2021-12-22 ENCOUNTER — Encounter: Payer: Self-pay | Admitting: Family Medicine

## 2021-12-22 ENCOUNTER — Ambulatory Visit (INDEPENDENT_AMBULATORY_CARE_PROVIDER_SITE_OTHER): Payer: 59 | Admitting: Family Medicine

## 2021-12-22 VITALS — BP 144/94 | HR 65 | Temp 97.3°F | Wt 147.4 lb

## 2021-12-22 DIAGNOSIS — Z79891 Long term (current) use of opiate analgesic: Secondary | ICD-10-CM | POA: Diagnosis not present

## 2021-12-22 DIAGNOSIS — I10 Essential (primary) hypertension: Secondary | ICD-10-CM

## 2021-12-22 DIAGNOSIS — M810 Age-related osteoporosis without current pathological fracture: Secondary | ICD-10-CM | POA: Diagnosis not present

## 2021-12-22 MED ORDER — OXYCODONE-ACETAMINOPHEN 10-325 MG PO TABS: ORAL_TABLET | ORAL | 0 refills | Status: DC

## 2021-12-22 MED ORDER — PANTOPRAZOLE SODIUM 40 MG PO TBEC: 40.0000 mg | DELAYED_RELEASE_TABLET | Freq: Every day | ORAL | 3 refills | Status: DC

## 2021-12-22 MED ORDER — VALSARTAN 160 MG PO TABS: 160.0000 mg | ORAL_TABLET | Freq: Every day | ORAL | 3 refills | Status: DC

## 2021-12-22 MED ORDER — AMLODIPINE BESYLATE 2.5 MG PO TABS: 2.5000 mg | ORAL_TABLET | Freq: Every day | ORAL | 3 refills | Status: DC

## 2021-12-22 NOTE — Progress Notes (Signed)
? ?  Subjective:  ? ? Patient ID: Cheryl Rowe, female    DOB: 10-Jan-1961, 61 y.o.   MRN: [id number] ? ?HPI ?This patient was seen today for chronic pain ? ?The medication list was reviewed and updated. ? ?Location of Pain for which the patient has been treated with regarding narcotics:  ? ?Onset of this pain:  ? ? -Compliance with medication: Oxycodone 10-325 ? ?- Number patient states they take daily: 5 ? ?-when was the last dose patient took? 12:30 ? ?The patient was advised the importance of maintaining medication and not using illegal substances with these. ? ?Here for refills and follow up ? ?The patient was educated that we can provide 3 monthly scripts for their medication, it is their responsibility to follow the instructions. ? ?Side effects or complications from medications: none ? ?Patient is aware that pain medications are meant to minimize the severity of the pain to allow their pain levels to improve to allow for better function. They are aware of that pain medications cannot totally remove their pain. ? ?Due for UDT ( at least once per year) : completed 03/2021 ? ?Scale of 1 to 10 ( 1 is least 10 is most) ?Your pain level without the medicine: 8 ?Your pain level with medication: 4 ? ?Scale 1 to 10 ( 1-helps very little, 10 helps very well) ?How well does your pain medication reduce your pain so you can function better through out the day? 8 (takes the edge of) ? ?Quality of the pain:  ? ?Persistence of the pain:  ? ?Modifying factors:  ? ?Pt states blood pressure has been staying elevated due to stress. Blood pressure causing headache.  ?   ? ? ?Review of Systems ? ?   ?Objective:  ? Physical Exam ?General-in no acute distress ?Eyes-no discharge ?Lungs-respiratory rate normal, CTA ?CV-no murmurs,RRR ?Extremities skin warm dry no edema ?Neuro grossly normal ?Behavior normal, alert ? ?Patient does have evidence of back surgery along with kyphosis ? ? ?   ?Assessment & Plan:  ? ?1. Encounter for  long-term opiate analgesic use ?The patient was seen in followup for chronic pain. ?A review over at their current pain status was discussed. Drug registry was checked. ?Prescriptions were given.  Regular follow-up recommended. ?Discussion was held regarding the importance of compliance with medication as well as pain medication contract. ? ?Patient was informed that medication may cause drowsiness and should not be combined  with other medications/alcohol or street drugs. If the patient feels medication is causing altered alertness then do not drive or operate dangerous equipment. ? ?Should be noted that the patient appears to be meeting appropriate use of opioids and response.  Evidenced by improved function and decent pain control without significant side effects and no evidence of overt aberrancy issues.  Upon discussion with the patient today they understand that opioid therapy is optional and they feel that the pain has been refractory to reasonable conservative measures and is significant and affecting quality of life enough to warrant ongoing therapy and wishes to continue opioids.  Refills were provided. ? ? ?2. Primary hypertension ?Blood pressure not quite at goal we will add amlodipine 2.5 mg daily continue valsartan 160 mg daily ? ?3. Osteoporosis without current pathological fracture, unspecified osteoporosis type ?Bone density this July' ?

## 2022-01-12 ENCOUNTER — Telehealth: Payer: Self-pay

## 2022-01-12 MED ORDER — VENLAFAXINE HCL ER 150 MG PO CP24: 150.0000 mg | ORAL_CAPSULE | Freq: Every day | ORAL | 1 refills | Status: DC

## 2022-01-12 NOTE — Telephone Encounter (Signed)
Encourage patient to contact the pharmacy for refills or they can request refills through [facility] ? ?(Please schedule appointment if patient has not been seen in over a year) ? ? ? ?WHAT PHARMACY WOULD THEY LIKE THIS SENT TO: [id number] [place] [address] [id number] ? ?MEDICATION NAME & DOSE:venlafaxine XR (EFFEXOR-XR) 150 MG 24 hr capsule  ? ?NOTES/COMMENTS FROM PATIENT: ? ? ? ? ? ?[place] please notify patient: ?It takes 48-72 hours to process rx refill requests ?Ask patient to call pharmacy to ensure rx is ready before heading there.  ? ?

## 2022-01-12 NOTE — Telephone Encounter (Signed)
Patient notified

## 2022-01-12 NOTE — Telephone Encounter (Signed)
Patient last seen on 12/22/21 for long term opiate analgesic use. Please advise. Thank you ?

## 2022-01-12 NOTE — Telephone Encounter (Signed)
Prescription sent electronically to pharmacy  Left message to return call 

## 2022-01-12 NOTE — Telephone Encounter (Signed)
May have 6 months on refill ?

## 2022-01-22 ENCOUNTER — Ambulatory Visit (INDEPENDENT_AMBULATORY_CARE_PROVIDER_SITE_OTHER): Payer: 59 | Admitting: Family Medicine

## 2022-01-22 ENCOUNTER — Encounter: Payer: Self-pay | Admitting: Family Medicine

## 2022-01-22 ENCOUNTER — Other Ambulatory Visit: Payer: Self-pay

## 2022-01-22 VITALS — BP 142/94 | HR 68 | Temp 98.1°F | Ht 64.0 in | Wt 150.0 lb

## 2022-01-22 DIAGNOSIS — Z79891 Long term (current) use of opiate analgesic: Secondary | ICD-10-CM

## 2022-01-22 DIAGNOSIS — R011 Cardiac murmur, unspecified: Secondary | ICD-10-CM | POA: Diagnosis not present

## 2022-01-22 DIAGNOSIS — I10 Essential (primary) hypertension: Secondary | ICD-10-CM | POA: Diagnosis not present

## 2022-01-22 MED ORDER — AMLODIPINE BESYLATE 5 MG PO TABS: 5.0000 mg | ORAL_TABLET | Freq: Every day | ORAL | 1 refills | Status: DC

## 2022-01-22 MED ORDER — PANTOPRAZOLE SODIUM 40 MG PO TBEC: 40.0000 mg | DELAYED_RELEASE_TABLET | Freq: Every day | ORAL | 3 refills | Status: DC

## 2022-01-22 MED ORDER — OXYCODONE-ACETAMINOPHEN 10-325 MG PO TABS: ORAL_TABLET | ORAL | 0 refills | Status: DC

## 2022-01-22 NOTE — Progress Notes (Signed)
Subjective:    Patient ID: [name], female    DOB: June 13, 1961, 61 y.o.   MRN: [id number]  Hypertension This is a chronic problem. Treatments tried: amlodipine, valsartan,  Eye Problem  The left eye is affected.  Stye of the right eye  Back pain taking prescribed medications and asa Check heart for murmur  Patient was seen by disability doctor recently was noted to have a murmur Patient denies any chest pressure tightness pain or shortness of breath Patient has chronic pain and discomfort in her back that limits her ability to do things she has had failed surgery for severe scoliosis and has been left with severe chronic pain takes pain medication multiple times per day which takes the edge off the pain but does not allow her to function appropriately she cannot do crawling squatting twisting cannot do any significant lifting.  Is in constant pain often has to either lay down or sit in the chair or stand to get some relief there is no way that she would be able to hold a regular job  This patient was seen today for chronic pain  The medication list was reviewed and updated.  Location of Pain for which the patient has been treated with regarding narcotics: Mid back low back upper thoracic  Onset of this pain: Present for years   -Compliance with medication: Good  - Number patient states they take daily: 5 to 6/day  -when was the last dose patient took?  Earlier today  The patient was advised the importance of maintaining medication and not using illegal substances with these.  Here for refills and follow up  The patient was educated that we can provide 3 monthly scripts for their medication, it is their responsibility to follow the instructions.  Side effects or complications from medications: Denies side effects  Patient is aware that pain medications are meant to minimize the severity of the pain to allow their pain levels to improve to allow for better function. They  are aware of that pain medications cannot totally remove their pain.  Due for UDT ( at least once per year) : On next visit  Scale of 1 to 10 ( 1 is least 10 is most) Your pain level without the medicine: 8 Your pain level with medication 4-5  Scale 1 to 10 ( 1-helps very little, 10 helps very well) How well does your pain medication reduce your pain so you can function better through out the day?  7  Quality of the pain: Constant burning aching  Persistence of the pain: Present all the time  Modifying factors: Worse with activity      Review of Systems     Objective:   Physical Exam General-in no acute distress Eyes-no discharge Lungs-respiratory rate normal, CTA CV-murmurs,RRR Extremities skin warm dry no edema Neuro grossly normal Behavior normal, alert        Assessment & Plan:  Early systolic murmur-echo No DOE No pedal edema, history of blood pressure  Elevated blood pressure/HTN-bump up dose amlodipine to 5 mg  Chronic pain with her back she has had failed back surgery in regards to alleviating her pain.  Patient extremely limited with her activity unable to do any significant bending twisting lifting pulling pushing cannot do crawling squatting.  Finds herself frequently having to lay down or rest to alleviate some of her pain takes pain medication approximately 5 to 6/day Drug registry checked Medication does help her with her pain Refill sent  in

## 2022-01-27 ENCOUNTER — Telehealth: Payer: Self-pay

## 2022-01-27 ENCOUNTER — Other Ambulatory Visit: Payer: Self-pay

## 2022-01-27 MED ORDER — CEPHALEXIN 500 MG PO CAPS: 500.0000 mg | ORAL_CAPSULE | Freq: Three times a day (TID) | ORAL | 0 refills | Status: DC

## 2022-01-27 NOTE — Telephone Encounter (Signed)
Patient has been informed per drs orders recommendations .

## 2022-01-27 NOTE — Telephone Encounter (Signed)
Keflex 500 mg 1 taken 3 times daily for 7 days Warm compresses 10 to 15 minutes several times per day If not dramatically better within the next 10 days let us know we will do referral to ophthalmology

## 2022-01-27 NOTE — Telephone Encounter (Signed)
Caller name:Sharee Mcneely   On DPR? :YEs  Call back [phone]  Provider they see: [place]   Reason for call:Pt was seen last Thursday and has a stye in her eye and Dr Nicki Reaper was going to send antibiotic to pharmacy and nothing was ever sent [place]

## 2022-02-04 ENCOUNTER — Other Ambulatory Visit: Payer: Self-pay | Admitting: Family Medicine

## 2022-02-04 ENCOUNTER — Telehealth: Payer: Self-pay

## 2022-02-04 MED ORDER — OXYCODONE HCL 10 MG PO TABS: ORAL_TABLET | ORAL | 0 refills | Status: DC

## 2022-02-04 NOTE — Progress Notes (Signed)
Due to shortage need to read change prescription to plain oxycodone and send this in for 1 month

## 2022-02-04 NOTE — Telephone Encounter (Signed)
LM on vm (pt identifying herself) that medication was sent to the pharmacy. Any questions or concerns to call office.

## 2022-02-04 NOTE — Telephone Encounter (Signed)
I did send in plain oxycodone 10 mg, 1 every 4 maximum 5/day, #150, may fill 02/04/2022-[facility] hopefully that will solve the issue

## 2022-02-04 NOTE — Telephone Encounter (Signed)
Caller name:Zamoria Forsberg   On DPR? :Yes   Call back [phone]  Provider they see: Luking   Reason for call:Pt is calling there is a shortage on oxyCODONE-acetaminophen (PERCOCET) 10-325 MG tablet  pt said her dad take the one without Tylenol  they pharmacy has that in stock can Dr Nicki Reaper call in a new RX for the one with out Tylenol just for this month?

## 2022-02-19 ENCOUNTER — Ambulatory Visit (HOSPITAL_COMMUNITY)
Admission: RE | Admit: 2022-02-19 | Discharge: 2022-02-19 | Disposition: A | Discharge status: Home / Self Care | Payer: 59 | Source: Ambulatory Visit | Attending: Family Medicine | Admitting: Family Medicine

## 2022-02-19 DIAGNOSIS — R011 Cardiac murmur, unspecified: Secondary | ICD-10-CM

## 2022-02-19 LAB — ECHOCARDIOGRAM COMPLETE
Area-P 1/2: 2.83 cm2
S' Lateral: 2.4 cm

## 2022-02-19 NOTE — Progress Notes (Signed)
*  PRELIMINARY RESULTS* Echocardiogram 2D Echocardiogram has been performed.  Samuel [place] 02/19/2022, 2:55 PM

## 2022-03-23 ENCOUNTER — Ambulatory Visit (HOSPITAL_COMMUNITY)
Admission: RE | Admit: 2022-03-23 | Discharge: 2022-03-23 | Disposition: A | Discharge status: Home / Self Care | Payer: 59 | Source: Ambulatory Visit | Attending: Family Medicine | Admitting: Family Medicine

## 2022-03-23 DIAGNOSIS — M85832 Other specified disorders of bone density and structure, left forearm: Secondary | ICD-10-CM | POA: Diagnosis not present

## 2022-03-23 DIAGNOSIS — M810 Age-related osteoporosis without current pathological fracture: Secondary | ICD-10-CM | POA: Insufficient documentation

## 2022-03-23 DIAGNOSIS — Z780 Asymptomatic menopausal state: Secondary | ICD-10-CM | POA: Diagnosis not present

## 2022-04-14 ENCOUNTER — Encounter: Payer: Self-pay | Admitting: Family Medicine

## 2022-04-14 ENCOUNTER — Ambulatory Visit (INDEPENDENT_AMBULATORY_CARE_PROVIDER_SITE_OTHER): Payer: 59 | Admitting: Family Medicine

## 2022-04-14 VITALS — BP 149/76 | HR 81 | Wt 149.4 lb

## 2022-04-14 DIAGNOSIS — Z79891 Long term (current) use of opiate analgesic: Secondary | ICD-10-CM | POA: Diagnosis not present

## 2022-04-14 DIAGNOSIS — I10 Essential (primary) hypertension: Secondary | ICD-10-CM | POA: Diagnosis not present

## 2022-04-14 MED ORDER — OXYCODONE-ACETAMINOPHEN 10-325 MG PO TABS: ORAL_TABLET | ORAL | 0 refills | Status: DC

## 2022-04-14 MED ORDER — VENLAFAXINE HCL ER 75 MG PO CP24: ORAL_CAPSULE | ORAL | 5 refills | Status: DC

## 2022-04-14 MED ORDER — AMLODIPINE BESYLATE 5 MG PO TABS: 5.0000 mg | ORAL_TABLET | Freq: Every day | ORAL | 1 refills | Status: DC

## 2022-04-14 NOTE — Progress Notes (Signed)
Subjective:    Patient ID: Cheryl Rowe, female    DOB: 31-May-1961, 61 y.o.   MRN: [id number]  HPI Pt arrives for follow up. Pt states she has not had Amlodipine 5 mg in about one week; had not had Pantoprazole in 2 months. Has been buying Pantoprazole OTC.  She is trying to do the best she can with her health Trying to eat relatively healthy Her blood pressure has been up because she is ran out of her medicine She also relates finding himself feeling more depressed she has to live currently with her parents who argue a lot it puts a lot of stress on her She is looking to get her own place Her husband died a while back she is working through the grief of that she is recently met someone online that she is going to see in the near future and she is trying to be cautious about all of this Pt states her depression has worsened. Has had some headaches possibly due to stress.  This patient was seen today for chronic pain  The medication list was reviewed and updated.  Location of Pain for which the patient has been treated with regarding narcotics: She has chronic pain in her back as had previous surgery and because of scar tissue and other issues she is unable to tolerate much of anything without utilizing pain medicine  Onset of this pain: Been going on for years   -Compliance with medication: Good compliance  - Number patient states they take daily: On a good day she takes 4/day on a bad day 6/day  -when was the last dose patient took?  Earlier today  The patient was advised the importance of maintaining medication and not using illegal substances with these.  Here for refills and follow up  The patient was educated that we can provide 3 monthly scripts for their medication, it is their responsibility to follow the instructions.  Side effects or complications from medications: She denies side effects  Patient is aware that pain medications are meant to minimize the severity of  the pain to allow their pain levels to improve to allow for better function. They are aware of that pain medications cannot totally remove their pain.  Due for UDT ( at least once per year) : She is due on her next visit  Scale of 1 to 10 ( 1 is least 10 is most) Your pain level without the medicine: 8 Your pain level with medication 3  Scale 1 to 10 ( 1-helps very little, 10 helps very well) How well does your pain medication reduce your pain so you can function better through out the day? 7  Quality of the pain: Aching throbbing  Persistence of the pain: Present all the time  Modifying factors: Worse with activity       Review of Systems     Objective:   Physical Exam General-in no acute distress Eyes-no discharge Lungs-respiratory rate normal, CTA CV-no murmurs,RRR Extremities skin warm dry no edema Neuro grossly normal Behavior normal, alert        Assessment & Plan:   1. Primary hypertension Blood pressure decent control but she ran out of her medicine she will resume her medicines and notify us if any ongoing troubles  2. Encounter for long-term opiate analgesic use The patient was seen in followup for chronic pain. A review over at their current pain status was discussed. Drug registry was checked. Prescriptions were given.  Regular  follow-up recommended. Discussion was held regarding the importance of compliance with medication as well as pain medication contract.  Patient was informed that medication may cause drowsiness and should not be combined  with other medications/alcohol or street drugs. If the patient feels medication is causing altered alertness then do not drive or operate dangerous equipment.  Should be noted that the patient appears to be meeting appropriate use of opioids and response.  Evidenced by improved function and decent pain control without significant side effects and no evidence of overt aberrancy issues.  Upon discussion with the  patient today they understand that opioid therapy is optional and they feel that the pain has been refractory to reasonable conservative measures and is significant and affecting quality of life enough to warrant ongoing therapy and wishes to continue opioids.  Refills were provided. 2 prescription sent in  Patient to follow-up near future  Major depression worse not suicidal recommend Effexor bump up to 225 mg  Patient to get her amlodipine filled and get back on her medicine  Recheck in 6 weeks how her depression is doing

## 2022-04-24 ENCOUNTER — Ambulatory Visit: Payer: Self-pay | Admitting: Family Medicine

## 2022-05-07 ENCOUNTER — Telehealth: Payer: Self-pay | Admitting: *Deleted

## 2022-05-07 NOTE — Telephone Encounter (Addendum)
[facility] called for updated orders for Reclast- patient currently seeing Dr Dorris Fetch for osteoporosis and has f/u scheduled with him 05/21/22   [facility] notified patient current; under care of endocrinology.

## 2022-05-08 ENCOUNTER — Other Ambulatory Visit: Payer: Self-pay | Admitting: Family Medicine

## 2022-05-10 NOTE — Telephone Encounter (Signed)
Nurses-this patient is on recall last once yearly.  I did read through Dr. Liliane Channel note from March.  He agrees that she should be on Reclast.  But he also states in his note that she will continue to follow through with [place] for that issue.  She will need repeat class.  If Dr. Liliane Channel office wants to handle that that is fine but otherwise it will be ours to handle.(Please see March note from Dr. Dorris Fetch) diagnosis osteoporosis

## 2022-05-11 NOTE — Telephone Encounter (Signed)
Patient has follow up office visit with Dr Dorris Fetch for osteoporosis follow up next week 05/21/22

## 2022-05-11 NOTE — Telephone Encounter (Signed)
That's fine

## 2022-05-21 ENCOUNTER — Ambulatory Visit: Payer: 59 | Admitting: "Endocrinology

## 2022-05-26 ENCOUNTER — Ambulatory Visit (INDEPENDENT_AMBULATORY_CARE_PROVIDER_SITE_OTHER): Payer: 59 | Admitting: Family Medicine

## 2022-05-26 ENCOUNTER — Encounter: Payer: Self-pay | Admitting: Family Medicine

## 2022-05-26 VITALS — BP 138/88 | Wt 155.2 lb

## 2022-05-26 DIAGNOSIS — I10 Essential (primary) hypertension: Secondary | ICD-10-CM | POA: Diagnosis not present

## 2022-05-26 DIAGNOSIS — Z23 Encounter for immunization: Secondary | ICD-10-CM | POA: Diagnosis not present

## 2022-05-26 MED ORDER — PANTOPRAZOLE SODIUM 40 MG PO TBEC: 40.0000 mg | DELAYED_RELEASE_TABLET | Freq: Every day | ORAL | 1 refills | Status: DC

## 2022-05-26 MED ORDER — VALSARTAN-HYDROCHLOROTHIAZIDE 160-12.5 MG PO TABS: 1.0000 | ORAL_TABLET | Freq: Every day | ORAL | 3 refills | Status: DC

## 2022-05-26 NOTE — Progress Notes (Signed)
Subjective:    Patient ID: Cheryl Rowe, female    DOB: [birth year], 61 y.o.   MRN: [id number]  HPI Pt arrives to follow up on HTN. Pt states she does get headaches from blood pressure being elevated. Not checking blood pressure at home at this time. Pt is taking Amlodipine 5 mg in the evening.   This patient was seen today for chronic pain This patient was seen today for chronic pain  The medication list was reviewed and updated.  Location of Pain for which the patient has been treated with regarding narcotics: She has significant pain discomfort in her neck and mid back  Onset of this pain: She has had this for years but is worse since having surgery where a rod was placed on her back   -Compliance with medication: She relates compliance with the medicine  - Number patient states they take daily: Maximum of 5/day  -when was the last dose patient took?  Earlier today  The patient was advised the importance of maintaining medication and not using illegal substances with these.  Here for refills and follow up  The patient was educated that we can provide 3 monthly scripts for their medication, it is their responsibility to follow the instructions.  Side effects or complications from medications: She denies any side effects or problems with the medicine  Patient is aware that pain medications are meant to minimize the severity of the pain to allow their pain levels to improve to allow for better function. They are aware of that pain medications cannot totally remove their pain.  Due for UDT ( at least once per year) : Due on her next visit  Scale of 1 to 10 ( 1 is least 10 is most) Your pain level without the medicine: 8 Your pain level with medication 4  Scale 1 to 10 ( 1-helps very little, 10 helps very well) How well does your pain medication reduce your pain so you can function better through out the day? 7-8  Quality of the pain: Throbbing aching pain  Persistence of  the pain: Present all the time  Modifying factors: Worse with activity    Patient relates that her diet could be better but she hopes to be getting her own place in the near future and moving out from her parents home she knows that she could do a better job with walking for exercise  The medication list was reviewed and updated.     Review of Systems     Objective:   Physical Exam  General-in no acute distress Eyes-no discharge Lungs-respiratory rate normal, CTA CV-no murmurs,RRR Extremities skin warm dry no edema Neuro grossly normal Behavior normal, alert       Assessment & Plan:  Blood pressure mildly elevated switch over to valsartan HCTZ  Pain medicine will update her pain medicines The patient was seen in followup for chronic pain. A review over at their current pain status was discussed. Drug registry was checked. Prescriptions were given.  Regular follow-up recommended. Discussion was held regarding the importance of compliance with medication as well as pain medication contract.  Patient was informed that medication may cause drowsiness and should not be combined  with other medications/alcohol or street drugs. If the patient feels medication is causing altered alertness then do not drive or operate dangerous equipment.  Should be noted that the patient appears to be meeting appropriate use of opioids and response.  Evidenced by improved function and decent pain  control without significant side effects and no evidence of overt aberrancy issues.  Upon discussion with the patient today they understand that opioid therapy is optional and they feel that the pain has been refractory to reasonable conservative measures and is significant and affecting quality of life enough to warrant ongoing therapy and wishes to continue opioids.  Refills were provided.  Follow-up by December

## 2022-06-11 ENCOUNTER — Telehealth: Payer: Self-pay

## 2022-06-11 NOTE — Telephone Encounter (Signed)
Caller name:Burnette Bartolini   On DPR? :Yes  Call back number:[phone]  Provider they see: [place]   Reason for call:Pt needs pro authorization regarding oxyCODONE-acetaminophen (PERCOCET) 10-325 MG tablet in order for pharmacy to fill

## 2022-06-11 NOTE — Telephone Encounter (Signed)
PA attempted through cover my meds- cover my meds stated "available with authorization". Contacted pharmacy and they state that pt picked up a 7 day supply today. Once pt is out of those tablets, we will need to send in another script and that will initiate another PA. Attempted to contact patient to let her know but left message to return call.

## 2022-06-12 NOTE — Telephone Encounter (Signed)
Pt returned call and verbalized understanding. Pt will call back next week when needing refill.

## 2022-06-17 ENCOUNTER — Telehealth: Payer: Self-pay | Admitting: Family Medicine

## 2022-06-17 ENCOUNTER — Other Ambulatory Visit: Payer: Self-pay | Admitting: Family Medicine

## 2022-06-17 MED ORDER — OXYCODONE-ACETAMINOPHEN 10-325 MG PO TABS: ORAL_TABLET | ORAL | 0 refills | Status: DC

## 2022-06-17 NOTE — Telephone Encounter (Signed)
Patient notified and stated she will let us know if she needs anything from the office

## 2022-06-17 NOTE — Telephone Encounter (Signed)
Nurses Refills was sent in If she needs prior approval it is important for her pharmacy to let us know that is so proper processing/protocol can be done

## 2022-06-17 NOTE — Telephone Encounter (Signed)
Patient is requesting refill on percocet 10 mg to sent into [facility] she could on get 7 pills due to her insurance change.

## 2022-06-25 ENCOUNTER — Ambulatory Visit: Payer: Self-pay | Admitting: Nurse Practitioner

## 2022-07-02 ENCOUNTER — Encounter (HOSPITAL_COMMUNITY): Payer: 59

## 2022-07-03 ENCOUNTER — Other Ambulatory Visit: Payer: Self-pay | Admitting: Family Medicine

## 2022-07-15 DIAGNOSIS — D2239 Melanocytic nevi of other parts of face: Secondary | ICD-10-CM | POA: Diagnosis not present

## 2022-07-27 DIAGNOSIS — Z0100 Encounter for examination of eyes and vision without abnormal findings: Secondary | ICD-10-CM | POA: Diagnosis not present

## 2022-07-27 DIAGNOSIS — H5203 Hypermetropia, bilateral: Secondary | ICD-10-CM | POA: Diagnosis not present

## 2022-08-11 ENCOUNTER — Encounter: Payer: Self-pay | Admitting: Family Medicine

## 2022-08-11 ENCOUNTER — Ambulatory Visit (INDEPENDENT_AMBULATORY_CARE_PROVIDER_SITE_OTHER): Payer: 59 | Admitting: Family Medicine

## 2022-08-11 VITALS — BP 138/84 | HR 76 | Temp 98.1°F | Wt 155.8 lb

## 2022-08-11 DIAGNOSIS — J019 Acute sinusitis, unspecified: Secondary | ICD-10-CM

## 2022-08-11 DIAGNOSIS — R6889 Other general symptoms and signs: Secondary | ICD-10-CM | POA: Diagnosis not present

## 2022-08-11 MED ORDER — AMOXICILLIN 500 MG PO CAPS: ORAL_CAPSULE | ORAL | 0 refills | Status: DC

## 2022-08-11 NOTE — Progress Notes (Signed)
   Subjective:    Patient ID: Cheryl Rowe, female    DOB: Oct 04, 1960, 61 y.o.   MRN: [id number]  HPI Pt arrives with congestion, sinus drainage, sore throat, cough. Began this past Saturday. Pt states she feels like it the flu.  Patient with body aches head congestion drainage not feeling good sinus pressure pain discomfort no significant breathing issues or wheezing no issues  Review of Systems     Objective:   Physical Exam  Gen-NAD not toxic TMS-normal bilateral T- normal no redness Chest-CTA respiratory rate normal no crackles CV RRR no murmur Skin-warm dry Neuro-grossly normal  Mild sinus tenderness     Assessment & Plan:  1. Flu-like symptoms I triple swab results rest up to stay away from others - COVID-19, Flu A+B and RSV  2. Acute rhinosinusitis Antibiotics prescribed Signs discussed follow-up - COVID-19, Flu A+B and RSV

## 2022-08-13 LAB — COVID-19, FLU A+B AND RSV
Influenza A, NAA: NOT DETECTED
Influenza B, NAA: NOT DETECTED
RSV, NAA: DETECTED — AB
SARS-CoV-2, NAA: NOT DETECTED

## 2022-08-18 ENCOUNTER — Telehealth: Payer: Self-pay | Admitting: Family Medicine

## 2022-08-18 ENCOUNTER — Other Ambulatory Visit: Payer: Self-pay | Admitting: Family Medicine

## 2022-08-18 MED ORDER — OXYCODONE-ACETAMINOPHEN 10-325 MG PO TABS: ORAL_TABLET | ORAL | 0 refills | Status: DC

## 2022-08-18 NOTE — Telephone Encounter (Signed)
Patient is requesting refill  oxycodone 10/325 sent to [facility], She was unable to come to pain management appointment this month due to being sick . Has appointment next month for her followup

## 2022-08-18 NOTE — Telephone Encounter (Signed)
Refill was sent in keep follow-up visit as planned

## 2022-08-19 NOTE — Telephone Encounter (Signed)
Left message to return call. Sent my chart message also

## 2022-08-20 NOTE — Telephone Encounter (Signed)
Per Epic; pt has read MyChart message

## 2022-09-07 ENCOUNTER — Encounter: Payer: Self-pay | Admitting: Family Medicine

## 2022-09-07 ENCOUNTER — Ambulatory Visit (INDEPENDENT_AMBULATORY_CARE_PROVIDER_SITE_OTHER): Payer: Medicare HMO | Admitting: Family Medicine

## 2022-09-07 VITALS — BP 136/76 | Wt 157.2 lb

## 2022-09-07 DIAGNOSIS — I10 Essential (primary) hypertension: Secondary | ICD-10-CM

## 2022-09-07 DIAGNOSIS — Z79891 Long term (current) use of opiate analgesic: Secondary | ICD-10-CM

## 2022-09-07 DIAGNOSIS — R5383 Other fatigue: Secondary | ICD-10-CM | POA: Diagnosis not present

## 2022-09-07 DIAGNOSIS — E210 Primary hyperparathyroidism: Secondary | ICD-10-CM

## 2022-09-07 DIAGNOSIS — E7849 Other hyperlipidemia: Secondary | ICD-10-CM

## 2022-09-07 DIAGNOSIS — R635 Abnormal weight gain: Secondary | ICD-10-CM | POA: Diagnosis not present

## 2022-09-07 MED ORDER — OXYCODONE-ACETAMINOPHEN 10-325 MG PO TABS: ORAL_TABLET | ORAL | 0 refills | Status: DC

## 2022-09-07 MED ORDER — AMLODIPINE BESYLATE 5 MG PO TABS: 5.0000 mg | ORAL_TABLET | Freq: Every day | ORAL | 1 refills | Status: DC

## 2022-09-07 NOTE — Progress Notes (Addendum)
Subjective:    Patient ID: Cheryl Rowe, female    DOB: Oct 15, 1960, 62 y.o.   MRN: [id number]  HPI This patient was seen today for chronic pain  The medication list was reviewed and updated.  Location of Pain for which the patient has been treated with regarding narcotics:   Onset of this pain:    -Compliance with medication: Oxycodone 10-325 mg  - Number patient states they take daily: 4 sometimes 5   -when was the last dose patient took? After lunch today  The patient was advised the importance of maintaining medication and not using illegal substances with these.  Here for refills and follow up  The patient was educated that we can provide 3 monthly scripts for their medication, it is their responsibility to follow the instructions.  Side effects or complications from medications: none  Patient is aware that pain medications are meant to minimize the severity of the pain to allow their pain levels to improve to allow for better function. They are aware of that pain medications cannot totally remove their pain.  Due for UDT ( at least once per year) : completed today   Scale of 1 to 10 ( 1 is least 10 is most) Your pain level without the medicine: depends on activity/day- can get up to 8  Your pain level with medication: 4-5  Scale 1 to 10 ( 1-helps very little, 10 helps very well) How well does your pain medication reduce your pain so you can function better through out the day? Helps with pain; doesn't go completley away but certainly helps  Quality of the pain: Throbbing aching pain in her back  Persistence of the pain: Present all the time worse with certain activities  Modifying factors: Worse with certain activities        Review of Systems     Objective:   Physical Exam  General-in no acute distress Eyes-no discharge Lungs-respiratory rate normal, CTA CV-no murmurs,RRR Extremities skin warm dry no edema Neuro grossly normal Behavior normal,  alert Limited range of motion of the back      Assessment & Plan:  1. Encounter for long-term opiate analgesic use The patient was seen in followup for chronic pain. A review over at their current pain status was discussed. Drug registry was checked. Prescriptions were given.  Regular follow-up recommended. Discussion was held regarding the importance of compliance with medication as well as pain medication contract.  Patient was informed that medication may cause drowsiness and should not be combined  with other medications/alcohol or street drugs. If the patient feels medication is causing altered alertness then do not drive or operate dangerous equipment.  Should be noted that the patient appears to be meeting appropriate use of opioids and response.  Evidenced by improved function and decent pain control without significant side effects and no evidence of overt aberrancy issues.  Upon discussion with the patient today they understand that opioid therapy is optional and they feel that the pain has been refractory to reasonable conservative measures and is significant and affecting quality of life enough to warrant ongoing therapy and wishes to continue opioids.  Refills were provided.  This patient has had multiple back surgeries.  She has a rod in her back.  She is not going to get better from this She has tried anti-inflammatories Tylenol hydrocodone without success.  She has tried physical therapy in the past her own exercises in the past as well as stretching.  Despite  all of this she has severe pain which pain medication is necessary in order to get under better control  Her pain medicine does allow her to function better patient does not abuse of medicine PDMP was been checked in the past on a regular basis She does urine drug screen on a regular basis No history of abuse - ToxASSURE Select 13 (MW), Urine  2. Primary hypertension HTN- patient seen for follow-up regarding HTN.    Diet, medication compliance, appropriate labs and refills were completed.   Importance of keeping blood pressure under good control to lessen the risk of complications discussed Regular follow-up visits discussed  - TSH - T4, Free - PTH, Intact and Calcium - Comprehensive metabolic panel - Lipid panel  3. Hyperparathyroidism, primary ([place]) Lab work ordered.  Patient was encouraged to follow-up with endocrinology we will share the labs with them - TSH - T4, Free - PTH, Intact and Calcium - Comprehensive metabolic panel - Lipid panel  4. Other hyperlipidemia Hyperlipidemia-importance of diet, weight control, activity, compliance with medications discussed.   Recent labs reviewed.   Any additional labs or refills ordered.   Importance of keeping under good control discussed. Regular follow-up visits discussed  - TSH - T4, Free - PTH, Intact and Calcium - Comprehensive metabolic panel - Lipid panel  5. Hypercalcemia Check lab work await results share with endocrinology - TSH - T4, Free - PTH, Intact and Calcium - Comprehensive metabolic panel - Lipid panel  Patient also having fatigue tiredness and concerned about weight gain.  We will check thyroid function

## 2022-09-08 LAB — MED LIST ATTACHED SEPARATELY

## 2022-09-10 LAB — SPECIMEN STATUS REPORT

## 2022-09-10 LAB — TOXASSURE SELECT 13 (MW), URINE

## 2022-09-25 DIAGNOSIS — E7849 Other hyperlipidemia: Secondary | ICD-10-CM | POA: Diagnosis not present

## 2022-09-25 DIAGNOSIS — R5383 Other fatigue: Secondary | ICD-10-CM | POA: Diagnosis not present

## 2022-09-25 DIAGNOSIS — R635 Abnormal weight gain: Secondary | ICD-10-CM | POA: Diagnosis not present

## 2022-09-25 DIAGNOSIS — E210 Primary hyperparathyroidism: Secondary | ICD-10-CM | POA: Diagnosis not present

## 2022-09-25 DIAGNOSIS — I10 Essential (primary) hypertension: Secondary | ICD-10-CM | POA: Diagnosis not present

## 2022-09-26 LAB — COMPREHENSIVE METABOLIC PANEL
ALT: 44 IU/L — ABNORMAL HIGH (ref 0–32)
AST: 27 IU/L (ref 0–40)
Albumin/Globulin Ratio: 2.5 — ABNORMAL HIGH (ref 1.2–2.2)
Albumin: 4.9 g/dL (ref 3.8–4.9)
Alkaline Phosphatase: 124 IU/L — ABNORMAL HIGH (ref 44–121)
BUN/Creatinine Ratio: 29 — ABNORMAL HIGH (ref 12–28)
BUN: 23 mg/dL (ref 8–27)
Bilirubin Total: 0.2 mg/dL (ref 0.0–1.2)
CO2: 26 mmol/L (ref 20–29)
Calcium: 11.2 mg/dL — ABNORMAL HIGH (ref 8.7–10.3)
Chloride: 101 mmol/L (ref 96–106)
Creatinine, Ser: 0.78 mg/dL (ref 0.57–1.00)
Globulin, Total: 2 g/dL (ref 1.5–4.5)
Glucose: 103 mg/dL — ABNORMAL HIGH (ref 70–99)
Potassium: 4.9 mmol/L (ref 3.5–5.2)
Sodium: 141 mmol/L (ref 134–144)
Total Protein: 6.9 g/dL (ref 6.0–8.5)
eGFR: 87 mL/min/{1.73_m2} (ref >59)

## 2022-09-26 LAB — LIPID PANEL
Chol/HDL Ratio: 2.7 ratio (ref 0.0–4.4)
Cholesterol, Total: 236 mg/dL — ABNORMAL HIGH (ref 100–199)
HDL: 86 mg/dL (ref >39)
LDL Chol Calc (NIH): 133 mg/dL — ABNORMAL HIGH (ref 0–99)
Triglycerides: 97 mg/dL (ref 0–149)
VLDL Cholesterol Cal: 17 mg/dL (ref 5–40)

## 2022-09-26 LAB — TSH: TSH: 1.32 u[IU]/mL (ref 0.450–4.500)

## 2022-09-26 LAB — PTH, INTACT AND CALCIUM: PTH: 80 pg/mL — ABNORMAL HIGH (ref 15–65)

## 2022-09-26 LAB — T4, FREE: Free T4: 1.11 ng/dL (ref 0.82–1.77)

## 2022-09-28 NOTE — Addendum Note (Signed)
Addended by: Dairl Ponder on: 09/28/2022 02:04 PM   Modules accepted: Orders

## 2022-11-04 ENCOUNTER — Ambulatory Visit: Payer: Medicare HMO | Admitting: Family Medicine

## 2022-11-05 ENCOUNTER — Encounter: Payer: Self-pay | Admitting: "Endocrinology

## 2022-11-05 ENCOUNTER — Ambulatory Visit: Payer: Medicare HMO | Admitting: "Endocrinology

## 2022-11-05 VITALS — BP 118/76 | HR 96 | Ht 64.0 in | Wt 152.4 lb

## 2022-11-05 DIAGNOSIS — E559 Vitamin D deficiency, unspecified: Secondary | ICD-10-CM | POA: Diagnosis not present

## 2022-11-05 DIAGNOSIS — M810 Age-related osteoporosis without current pathological fracture: Secondary | ICD-10-CM

## 2022-11-05 DIAGNOSIS — E042 Nontoxic multinodular goiter: Secondary | ICD-10-CM

## 2022-11-05 DIAGNOSIS — E782 Mixed hyperlipidemia: Secondary | ICD-10-CM

## 2022-11-05 DIAGNOSIS — E210 Primary hyperparathyroidism: Secondary | ICD-10-CM

## 2022-11-05 NOTE — Progress Notes (Signed)
11/05/2022, 4:14 PM   Endocrinology follow-up note  Cheryl Rowe is a 62 y.o.-year-old female, referred by her PCP, Dr. Wolfgang Phoenix for evaluation for hypercalcemia/primary hyperparathyroidism.  She also has osteoporosis on ongoing Reclast treatment. Past Medical History:  Diagnosis Date   ADHD (attention deficit hyperactivity disorder)    Arthritis    Depression    Family history of adverse reaction to anesthesia    sister has PONV   GERD (gastroesophageal reflux disease)    HTN (hypertension) 07/10/2021   Hyperlipidemia    Migraine    Scoliosis    Teenage   Spondylolisthesis of lumbosacral region    Wears glasses     Past Surgical History:  Procedure Laterality Date   CERVICAL ABLATION     CHOLECYSTECTOMY     COLONOSCOPY WITH PROPOFOL N/A 03/25/2018   Procedure: COLONOSCOPY WITH PROPOFOL;  Surgeon: Rogene Houston, MD;  Location: [facility];  Service: Endoscopy;  Laterality: N/A;  9:30   POLYPECTOMY  03/25/2018   Procedure: POLYPECTOMY;  Surgeon: Rogene Houston, MD;  Location: [facility];  Service: Endoscopy;;  hepatic flexure   TONSILLECTOMY      Social History   Tobacco Use   Smoking status: Never   Smokeless tobacco: Never  Vaping Use   Vaping Use: Never used  Substance Use Topics   Alcohol use: No   Drug use: No    Outpatient Encounter Medications as of 11/05/2022  Medication Sig   amLODipine (NORVASC) 5 MG tablet Take 1 tablet (5 mg total) by mouth daily.   Flaxseed, Linseed, (FLAX SEED OIL) 1000 MG CAPS Take 1,000 mg by mouth daily.   Multiple Vitamin (MULTIVITAMIN WITH MINERALS) TABS tablet Take 1 tablet by mouth daily.   oxyCODONE-acetaminophen (PERCOCET) 10-325 MG tablet Take one tablet by mouth every 4 hours prn pain max 5 per day   oxyCODONE-acetaminophen (PERCOCET) 10-325 MG tablet Take one tablet po every 4 hours prn pain max 5 per day   oxyCODONE-acetaminophen (PERCOCET) 10-325 MG tablet  Take one tablet po every 4 hours prn pain max 5 per day   pantoprazole (PROTONIX) 40 MG tablet Take 1 tablet (40 mg total) by mouth daily.   SUPER B COMPLEX/C PO Take 1 tablet by mouth daily.   valsartan-hydrochlorothiazide (DIOVAN-HCT) 160-12.5 MG tablet Take 1 tablet by mouth daily.   venlafaxine XR (EFFEXOR-XR) 75 MG 24 hr capsule TAKE 3 CAPSULES BY MOUTH EVERY DAY   vitamin E 400 UNIT capsule Take 400 Units by mouth daily.   Zoledronic Acid (RECLAST IV) Inject 5 mg into the vein once. yearly   No facility-administered encounter medications on file as of 11/05/2022.    Allergies  Allergen Reactions   Pravastatin     Legs ache   Cymbalta [Duloxetine Hcl] Other (See Comments)    Aggressive      HPI  Cheryl Rowe was diagnosed with mild primary hyperparathyroidism with intermittent hypercalcemia associated with high PTH.   Due to her hesitance to consider surgery, she was put on expectant management. She missed her last appointment.   She also missed her Reclast infusion in 2023. She returns with worsening profile with hypercalcemia of 11.2  and PTH of 80.  She previously had several instances of hypercalcemia and high PTH.   She is known to have osteoporosis on Reclast IV infusion yearly.  Her last infusion was in October 2022-status post 4 injections.  A trial of oral Fosamax was not tolerable for her.   -Review of her last bone density in July 2023 showed femoral neck T score of -3.1.      No interval falls/fractures.    She is currently on vitamin D 2000 units daily supplement.  She is not on calcium supplement.   -  She has spondylolisthesis of lumbosacral spine .  No prior history of fragility fractures or falls. No history of  kidney stones.      She is 12 years postmenopausal.     -She has normal renal function. she is not on HCTZ or other thiazide therapy.  Patient takes multiple supplements of vitamins and elements including calcium/magnesium/zinc and vitamin D.   Did so for several years.   she does not have a family history of hypercalcemia, pituitary tumors, thyroid cancer, or osteoporosis.   I reviewed her chart and she also has a history of multinodular goiter status post biopsy of right-sided thyroid nodule.  FNA results are reviewed showing benign follicular nodule in September 2017.   She has severe dyslipidemia , not on statins. She does not tolerate statins.   ROS: Limited as above.  PE: BP 118/76   Pulse 96   Ht '5\' 4"'$  (1.626 m)   Wt 152 lb 6.4 oz (69.1 kg)   BMI 26.16 kg/m  Wt Readings from Last 3 Encounters:  11/05/22 152 lb 6.4 oz (69.1 kg)  09/07/22 157 lb 3.2 oz (71.3 kg)  08/11/22 155 lb 12.8 oz (70.7 kg)    CMP     Component Value Date/Time   NA 141 09/25/2022 0839   K 4.9 09/25/2022 0839   CL 101 09/25/2022 0839   CO2 26 09/25/2022 0839   GLUCOSE 103 (H) 09/25/2022 0839   GLUCOSE 108 (H) 06/28/2020 1309   BUN 23 09/25/2022 0839   CREATININE 0.78 09/25/2022 0839   CALCIUM 11.2 (H) 09/25/2022 0839   PROT 6.9 09/25/2022 0839   ALBUMIN 4.9 09/25/2022 0839   AST 27 09/25/2022 0839   ALT 44 (H) 09/25/2022 0839   ALKPHOS 124 (H) 09/25/2022 0839   BILITOT 0.2 09/25/2022 0839   GFRNONAA 97 04/26/2020 1158   GFRAA 112 04/26/2020 1158     Lipid Panel ( most recent) Lipid Panel     Component Value Date/Time   CHOL 236 (H) 09/25/2022 0839   TRIG 97 09/25/2022 0839   HDL 86 09/25/2022 0839   CHOLHDL 2.7 09/25/2022 0839   CHOLHDL 4.0 03/15/2014 1259   VLDL 40 03/15/2014 1259   LDLCALC 133 (H) 09/25/2022 0839      Lab Results  Component Value Date   TSH 1.320 09/25/2022   TSH 0.759 11/13/2021   TSH 1.790 12/06/2017   TSH 1.080 09/27/2017   TSH 0.249 (L) 08/27/2017   TSH 1.050 04/22/2016   FREET4 1.11 09/25/2022   FREET4 1.00 11/13/2021   FREET4 1.33 12/06/2017   FREET4 1.07 09/27/2017   FREET4 0.79 (L) 04/22/2016    Recent Results (from the past 2160 hour(s))  COVID-19, Flu A+B and RSV     Status:  Abnormal   Collection Time: 08/11/22 12:00 AM  Result Value Ref Range   SARS-CoV-2, NAA Not Detected Not Detected   Influenza A, NAA Not  Detected Not Detected   Influenza B, NAA Not Detected Not Detected   RSV, NAA Detected (A) Not Detected   Test Information: Comment     Comment: This nucleic acid amplification test was developed and its performance characteristics determined by [employer]. Nucleic acid amplification tests include RT-PCR and TMA. This test has not been FDA cleared or approved. This test has been authorized by FDA under an Emergency Use Authorization (EUA). This test is only authorized for the duration of time the declaration that circumstances exist justifying the authorization of the emergency use of in vitro diagnostic tests for detection of SARS-CoV-2 virus and/or diagnosis of COVID-19 infection under section 564(b)(1) of the Act, 21 U.S.C. [id number]) (1), unless the authorization is terminated or revoked sooner. When diagnostic testing is negative, the possibility of a false negative result should be considered in the context of a patient's recent exposures and the presence of clinical signs and symptoms consistent with COVID-19. An individual without symptoms of COVID-19 and who is not shedding SARS-CoV-2 virus wo uld expect to have a negative (not detected) result in this assay.   ToxASSURE Select 13 (MW), Urine     Status: None   Collection Time: 09/07/22 12:00 AM  Result Value Ref Range   Summary Note     Comment: ==================================================================== Tox[employer]SU[employer] Select 13 (MW) ==================================================================== Test                             Result       Flag       Units  Drug Present and Declared for Prescription Verification   Oxycodone                      1661         EXPECTED   ng/mg creat   Oxymorphone                    3366         EXPECTED   ng/mg creat   Noroxycodone                    >6024        EXPECTED   ng/mg creat   Noroxymorphone                 914          EXPECTED   ng/mg creat    Sources of oxycodone are scheduled prescription medications.    Oxymorphone, noroxycodone, and noroxymorphone are expected    metabolites of oxycodone. Oxymorphone is also available as a    scheduled prescription medication.  Drug Present not Declared for Prescription Verification   Carboxy-THC                    >602         UNEXPECTED ng/mg creat    Carboxy-THC is a metabolite of tetrahydrocannabinol (THC). Source o f    THC is most commonly herbal marijuana or marijuana-based products,    but THC is also present in a scheduled prescription medication.    Trace amounts of THC can be present in hemp and cannabidiol (CBD)    products. This test is not intended to distinguish between delta-9-    tetrahydrocannabinol, the predominant form of THC in most herbal or    marijuana-based products, and delta-8-tetrahydrocannabinol.  ==================================================================== Test  Result    Flag   Units      Ref Range   Creatinine              166              mg/dL      >=20 ==================================================================== Declared Medications:  The flagging and interpretation on this report are based on the  following declared medications.  Unexpected results may arise from  inaccuracies in the declared medications.   **Note: The testing scope of this panel includes these medications:   Oxycodone ========================= =========================================== For clinical consultation, please call [phone]. ====================================================================   Med List Attached Separately     Status: None (Preliminary result)   Collection Time: 09/07/22 12:00 AM  Result Value Ref Range   Med List Attached Separately WILL FOLLOW   Specimen status report     Status: None    Collection Time: 09/07/22 12:00 AM  Result Value Ref Range   specimen status report Comment     Comment: Please note Please note The date and/or time of collection was not indicated on the requisition as required by state and federal law.  The date of receipt of the specimen was used as the collection date if not supplied.   TSH     Status: None   Collection Time: 09/25/22  8:39 AM  Result Value Ref Range   TSH 1.320 0.450 - 4.500 uIU/mL  T4, Free     Status: None   Collection Time: 09/25/22  8:39 AM  Result Value Ref Range   Free T4 1.11 0.82 - 1.77 ng/dL  PTH, Intact and Calcium     Status: Abnormal   Collection Time: 09/25/22  8:39 AM  Result Value Ref Range   PTH 80 (H) 15 - 65 pg/mL   PTH Interp Comment     Comment: Interpretation                 Intact PTH    Calcium                                 (pg/mL)      (mg/dL) Normal                          15 - 65     8.6 - 10.2 Primary Hyperparathyroidism         >65          >10.2 Secondary Hyperparathyroidism       >65          <10.2 Non-Parathyroid Hypercalcemia       <65          >10.2 Hypoparathyroidism                  <15          < 8.6 Non-Parathyroid Hypocalcemia    15 - 65          < 8.6   Comprehensive metabolic panel     Status: Abnormal   Collection Time: 09/25/22  8:39 AM  Result Value Ref Range   Glucose 103 (H) 70 - 99 mg/dL   BUN 23 8 - 27 mg/dL   Creatinine, Ser 0.78 0.57 - 1.00 mg/dL   eGFR 87 >59 mL/min/1.73   BUN/Creatinine Ratio 29 (H) 12 - 28   Sodium 141 134 - 144 mmol/L  Potassium 4.9 3.5 - 5.2 mmol/L   Chloride 101 96 - 106 mmol/L   CO2 26 20 - 29 mmol/L   Calcium 11.2 (H) 8.7 - 10.3 mg/dL   Total Protein 6.9 6.0 - 8.5 g/dL   Albumin 4.9 3.8 - 4.9 g/dL   Globulin, Total 2.0 1.5 - 4.5 g/dL   Albumin/Globulin Ratio 2.5 (H) 1.2 - 2.2   Bilirubin Total 0.2 0.0 - 1.2 mg/dL   Alkaline Phosphatase 124 (H) 44 - 121 IU/L   AST 27 0 - 40 IU/L   ALT 44 (H) 0 - 32 IU/L  Lipid panel     Status:  Abnormal   Collection Time: 09/25/22  8:39 AM  Result Value Ref Range   Cholesterol, Total 236 (H) 100 - 199 mg/dL   Triglycerides 97 0 - 149 mg/dL   HDL 86 >39 mg/dL   VLDL Cholesterol Cal 17 5 - 40 mg/dL   LDL Chol Calc (NIH) 133 (H) 0 - 99 mg/dL   Chol/HDL Ratio 2.7 0.0 - 4.4 ratio    Comment:                                   T. Chol/HDL Ratio                                             Men  Women                               1/2 Avg.Risk  3.4    3.3                                   Avg.Risk  5.0    4.4                                2X Avg.Risk  9.6    7.1                                3X Avg.Risk 23.4   11.0      Assessment: 1. 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  Osteoporosis 3.  Multinodular goiter-status post fine-needle aspiration in 2017 4.  Vitamin D  deficiency 5. Hyperlipidemia  Plan: -Her most recent labs show calcium stable at 11.2 with PTH of 80.  Her  most recent 24-hour urine calcium was minimal at 25, previously 77, however patient admits that she collected inadequate urine amount.   She  has osteoporosis.  She is now open for surgical intervention. I discussed and referred her to Dr. Harlow Asa. I ordered repeat 24 hour urine calcium measurement.    -She has osteoporosis of the hips and osteopenia of the spine.  She is open to resume her  Reclast IV. She has had 4 treatments before she inurrupted in 2023. She will be sent to infusion center for Reclast .   Her previous IV Reclast treatments were usually  conducted between October-November of each year.  Up to 6 years (62 more treatments )   is planned for her.   -  She is now vitamin D replete, at this time, advised to continue maintenance dose , vitamin D3 2000 units daily.  No history of  nephrolithiasis, fragility fractures. No abdominal pain, no major mood disorders, no bone pain.  Her next DXA is in July 2025. -Regarding her multinodular goiter :  - she is status post fine-needle  aspiration of the right-sided thyroid lobe nodule in September 0000000 with benign follicular adenoma . She does not require surgical intervention at this time.  Her thyroid function tests are within normal limits.    Regarding her dyslipidemia with LDL of 133: reports statin intolerance . She is advised on WFPB diet.  She is advised to maintain close follow-up with her PMD.   I spent  26 minutes in the care of the patient today including review of labs from Thyroid Function, CMP, and other relevant labs ; imaging/biopsy records (current and previous including abstractions from other facilities); face-to-face time discussing  her lab results and symptoms, medications doses, her options of short and long term treatment based on the latest standards of care / guidelines;   and documenting the encounter.  [name]  participated in the discussions, expressed understanding, and voiced agreement with the above plans.  All questions were answered to her satisfaction. she is encouraged to contact clinic should she have any questions or concerns prior to her return visit.  - Return in about 3 months (around 02/05/2023) for Refer her for Reclast Infusion, F/U with Labs after Surgery.   Glade Lloyd, MD [facility] [address] [place], [place] [address] Phone: [phone]  Fax: [phone]    This note was partially dictated with voice recognition software. Similar sounding words can be transcribed inadequately or may not  be corrected upon review.  11/05/2022, 4:14 PM

## 2022-11-06 ENCOUNTER — Telehealth: Payer: Self-pay

## 2022-11-06 ENCOUNTER — Other Ambulatory Visit: Payer: Self-pay

## 2022-11-06 NOTE — Telephone Encounter (Signed)
Left a message requesting pt return call to the office. 

## 2022-11-09 ENCOUNTER — Telehealth: Payer: Self-pay | Admitting: Pharmacy Technician

## 2022-11-09 NOTE — Telephone Encounter (Signed)
Dr. Concha Se, Juluis Rainier note:  Auth Submission: NO AUTH NEEDED Payer: [employer] Medication & CPT/J Code(s) submitted: Reclast (Zolendronic acid) [id number] Route of submission (phone, fax, portal):  Phone # Fax # Auth type: Buy/Bill Units/visits requested: 1 Reference number:  Approval from: 11/09/22 to 11/09/23

## 2022-11-20 ENCOUNTER — Encounter: Payer: Self-pay | Admitting: Nurse Practitioner

## 2022-11-20 ENCOUNTER — Ambulatory Visit (INDEPENDENT_AMBULATORY_CARE_PROVIDER_SITE_OTHER): Payer: Medicare HMO | Admitting: Nurse Practitioner

## 2022-11-20 VITALS — BP 142/88 | HR 78 | Temp 98.0°F | Ht 64.0 in | Wt 154.0 lb

## 2022-11-20 DIAGNOSIS — F419 Anxiety disorder, unspecified: Secondary | ICD-10-CM | POA: Diagnosis not present

## 2022-11-20 DIAGNOSIS — F32A Depression, unspecified: Secondary | ICD-10-CM | POA: Diagnosis not present

## 2022-11-21 ENCOUNTER — Encounter: Payer: Self-pay | Admitting: Nurse Practitioner

## 2022-11-21 NOTE — Progress Notes (Signed)
   Subjective:    Patient ID: Cheryl Rowe, female    DOB: 1961/06/28, 62 y.o.   MRN: [id number]  HPI Presents to discuss worsening depression and anxiety.  States her husband died in 06/25/20.  Has been living with her parents since that time.  States it started off well but has been getting much worse over time.  States there is lots of arguing which has been upsetting her nerves.  Her parents are telling her that she needs to take care of them as they get older.  Patient is in a good position financially so this is not an issue.  States she is intimidated by her father and feels like she cannot stay in the home anymore but feels guilty about leaving.  Also close to her father-in-law.  Has a brother-in-law with CP who she is also very close to.  Patient wishes to start counseling hopefully local and in person.  Denies suicidal or homicidal thoughts or ideation.  Denies any self-harm behavior.  Adherent to her current Effexor dosage with minimal improvement.    11/20/2022   11:30 AM  Depression screen PHQ 2/9  Decreased Interest 3  Down, Depressed, Hopeless 3  PHQ - 2 Score 6  Altered sleeping 3  Tired, decreased energy 3  Change in appetite 2  Feeling bad or failure about yourself  2  Trouble concentrating 2  Moving slowly or fidgety/restless 0  Suicidal thoughts 0  PHQ-9 Score 18  Difficult doing work/chores Very difficult      11/20/2022   11:30 AM 09/07/2022    3:04 PM 07/15/2020    2:55 PM  GAD 7 : Generalized Anxiety Score  Nervous, Anxious, on Edge 2 1 3   Control/stop worrying 2 1 3   Worry too much - different things 2 1 3   Trouble relaxing 3 1 3   Restless 2 1 2   Easily annoyed or irritable 3 1 3   Afraid - awful might happen 3 1 2   Total GAD 7 Score 17 7 19   Anxiety Difficulty Very difficult Somewhat difficult Very difficult         Objective:   Physical Exam NAD.  Alert, oriented.  Lungs clear.  Heart regular rate rhythm.  Tearful and crying during most of the  visit.  Making good eye contact.  Speech clear.  Thoughts logical coherent and relevant.  Dressed appropriately for the weather. Today's Vitals   11/20/22 1216 11/20/22 1230  BP: (!) 140/86 (!) 142/88  Pulse: 78   Temp: 98 F (36.7 C)   SpO2: 98%   Weight: 154 lb (69.9 kg)   Height: 5\' 4"  (1.626 m)    Body mass index is 26.43 kg/m.        Assessment & Plan:   Problem List Items Addressed This Visit       Other   Anxiety and depression - Primary   Relevant Orders   Ambulatory referral to Psychology   Continue current medication regimen.  Referred for mental health counseling.  Patient to call back or seek help sooner if any worsening symptoms.  Also discussed the importance of healthy diet, regular activity, adequate sleep and stress reduction.  Recheck here as needed.

## 2022-11-24 DIAGNOSIS — E213 Hyperparathyroidism, unspecified: Secondary | ICD-10-CM | POA: Diagnosis not present

## 2022-11-24 DIAGNOSIS — E041 Nontoxic single thyroid nodule: Secondary | ICD-10-CM | POA: Diagnosis not present

## 2022-11-30 ENCOUNTER — Other Ambulatory Visit (HOSPITAL_COMMUNITY): Payer: Self-pay | Admitting: Surgery

## 2022-11-30 DIAGNOSIS — E213 Hyperparathyroidism, unspecified: Secondary | ICD-10-CM

## 2022-12-04 ENCOUNTER — Other Ambulatory Visit: Payer: Self-pay | Admitting: Family Medicine

## 2022-12-07 ENCOUNTER — Ambulatory Visit (INDEPENDENT_AMBULATORY_CARE_PROVIDER_SITE_OTHER): Payer: Medicare HMO | Admitting: Family Medicine

## 2022-12-07 ENCOUNTER — Other Ambulatory Visit (HOSPITAL_COMMUNITY): Payer: Self-pay | Admitting: Surgery

## 2022-12-07 ENCOUNTER — Encounter: Payer: Self-pay | Admitting: Family Medicine

## 2022-12-07 VITALS — BP 126/86 | Ht 64.0 in | Wt 154.2 lb

## 2022-12-07 DIAGNOSIS — F32A Depression, unspecified: Secondary | ICD-10-CM | POA: Diagnosis not present

## 2022-12-07 DIAGNOSIS — Z79891 Long term (current) use of opiate analgesic: Secondary | ICD-10-CM

## 2022-12-07 DIAGNOSIS — F321 Major depressive disorder, single episode, moderate: Secondary | ICD-10-CM | POA: Diagnosis not present

## 2022-12-07 DIAGNOSIS — F419 Anxiety disorder, unspecified: Secondary | ICD-10-CM | POA: Diagnosis not present

## 2022-12-07 DIAGNOSIS — I10 Essential (primary) hypertension: Secondary | ICD-10-CM | POA: Diagnosis not present

## 2022-12-07 DIAGNOSIS — E213 Hyperparathyroidism, unspecified: Secondary | ICD-10-CM

## 2022-12-07 DIAGNOSIS — E210 Primary hyperparathyroidism: Secondary | ICD-10-CM

## 2022-12-07 MED ORDER — PREGABALIN 25 MG PO CAPS
25.0000 mg | ORAL_CAPSULE | Freq: Two times a day (BID) | ORAL | 2 refills | Status: DC
Start: 2022-12-07 — End: 2023-03-11

## 2022-12-07 MED ORDER — OXYCODONE-ACETAMINOPHEN 10-325 MG PO TABS: 1.0000 | ORAL_TABLET | ORAL | 0 refills | Status: DC | PRN

## 2022-12-07 MED ORDER — OXYCODONE-ACETAMINOPHEN 10-325 MG PO TABS: ORAL_TABLET | ORAL | 0 refills | Status: DC

## 2022-12-07 NOTE — Progress Notes (Signed)
Subjective:    Patient ID: Cheryl Rowe, female    DOB: 1960-12-09, 62 y.o.   MRN: [id number]  HPI  This patient was seen today for chronic pain  The medication list was reviewed and updated.  Location of Pain for which the patient has been treated with regarding narcotics: back  Onset of this pain: years   -Compliance with medication: yes  - Number patient states they take daily: 4 to 5 a day  -when was the last dose patient took? today  The patient was advised the importance of maintaining medication and not using illegal substances with these.  Here for refills and follow up  The patient was educated that we can provide 3 monthly scripts for their medication, it is their responsibility to follow the instructions.  Side effects or complications from medications: none  Patient is aware that pain medications are meant to minimize the severity of the pain to allow their pain levels to improve to allow for better function. They are aware of that pain medications cannot totally remove their pain.  Due for UDT ( at least once per year) : 08/2022  Problems with depression     Review of Systems     Objective:   Physical Exam General-in no acute distress Eyes-no discharge Lungs-respiratory rate normal, CTA CV-no murmurs,RRR Extremities skin warm dry no edema Neuro grossly normal Behavior normal, alert        Assessment & Plan:  1. Anxiety and depression Patient right now feels that if she can get her hyperparathyroidism under better control she will feel less anxious she has upcoming test and probable surgery  2. Encounter for long-term opiate analgesic use The patient was seen in followup for chronic pain. A review over at their current pain status was discussed. Drug registry was checked. Prescriptions were given.  Regular follow-up recommended. Discussion was held regarding the importance of compliance with medication as well as pain medication  contract.  Patient was informed that medication may cause drowsiness and should not be combined  with other medications/alcohol or street drugs. If the patient feels medication is causing altered alertness then do not drive or operate dangerous equipment.  Should be noted that the patient appears to be meeting appropriate use of opioids and response.  Evidenced by improved function and decent pain control without significant side effects and no evidence of overt aberrancy issues.  Upon discussion with the patient today they understand that opioid therapy is optional and they feel that the pain has been refractory to reasonable conservative measures and is significant and affecting quality of life enough to warrant ongoing therapy and wishes to continue opioids.  Refills were provided.  3 prescriptions were sent in She states medicine allows her to function she denies abusing the medicine 3. Primary hypertension Continue blood pressure medicine healthy diet minimize salt try to stay active  4. Hypercalcemia Followed by general surgery they are planning on doing the parathyroidectomy after testing  5. Hyperparathyroidism, primary Please see above  6. Depression, major, single episode, moderate Patient will keep [place] posted over the next few weeks how things are going if she feels things are going improved she will not need to have additional medications if we do not see improvement over the next month then consideration for adding the medicine Patient not suicidal she plans to sell the home that she used to live in She is still grieving the death of her husband but she is trying to gather a  new perspective

## 2022-12-07 NOTE — Patient Instructions (Signed)
Send me a Mychart update within 3 weeks

## 2022-12-09 ENCOUNTER — Encounter (HOSPITAL_COMMUNITY)
Admission: RE | Admit: 2022-12-09 | Discharge: 2022-12-09 | Disposition: A | Discharge status: Home / Self Care | Payer: Medicare HMO | Source: Ambulatory Visit | Attending: Surgery | Admitting: Surgery

## 2022-12-09 ENCOUNTER — Ambulatory Visit (HOSPITAL_COMMUNITY)
Admission: RE | Admit: 2022-12-09 | Discharge: 2022-12-09 | Disposition: A | Discharge status: Home / Self Care | Payer: Medicare HMO | Source: Ambulatory Visit | Attending: Surgery | Admitting: Surgery

## 2022-12-09 DIAGNOSIS — E041 Nontoxic single thyroid nodule: Secondary | ICD-10-CM | POA: Diagnosis not present

## 2022-12-09 DIAGNOSIS — E210 Primary hyperparathyroidism: Secondary | ICD-10-CM | POA: Diagnosis not present

## 2022-12-09 DIAGNOSIS — E213 Hyperparathyroidism, unspecified: Secondary | ICD-10-CM | POA: Insufficient documentation

## 2022-12-09 MED ORDER — TECHNETIUM TC 99M SESTAMIBI - CARDIOLITE
25.0000 | Freq: Once | INTRAVENOUS | Status: AC | PRN
  Administered 2022-12-09: 24.6 via INTRAVENOUS

## 2022-12-17 NOTE — Progress Notes (Signed)
USN shows a mid right thyroid nodule that is mildly suspicious.  Will need a needle biopsy prior to proceeding with parathyroid surgery.  Tresa Endo - I called patient and left message about the need for a biopsy.  Please schedule with radiology for FNA biopsy.  Also, USN shows potentially two parathyroid adenomas.  Will plan to evaluate both sides at the time of surgery.  Will discuss further with patient when FNA biopsy results are available.  Darnell Level, MD [facility] practice Office: [phone]

## 2022-12-29 ENCOUNTER — Other Ambulatory Visit: Payer: Self-pay | Admitting: Surgery

## 2022-12-29 DIAGNOSIS — E041 Nontoxic single thyroid nodule: Secondary | ICD-10-CM

## 2023-01-28 ENCOUNTER — Inpatient Hospital Stay
Admission: RE | Admit: 2023-01-28 | Discharge: 2023-01-28 | Disposition: A | Discharge status: Home / Self Care | Payer: Medicare HMO | Source: Ambulatory Visit | Attending: Surgery | Admitting: Surgery

## 2023-02-07 ENCOUNTER — Emergency Department (HOSPITAL_COMMUNITY)
Admission: EM | Admit: 2023-02-07 | Discharge: 2023-02-07 | Disposition: A | Discharge status: Home / Self Care | Payer: Medicare HMO | Attending: Emergency Medicine | Admitting: Emergency Medicine

## 2023-02-07 ENCOUNTER — Other Ambulatory Visit: Payer: Self-pay

## 2023-02-07 ENCOUNTER — Encounter (HOSPITAL_COMMUNITY): Payer: Self-pay

## 2023-02-07 ENCOUNTER — Emergency Department (HOSPITAL_COMMUNITY): Payer: Medicare HMO

## 2023-02-07 DIAGNOSIS — R109 Unspecified abdominal pain: Secondary | ICD-10-CM | POA: Diagnosis present

## 2023-02-07 DIAGNOSIS — R112 Nausea with vomiting, unspecified: Secondary | ICD-10-CM | POA: Insufficient documentation

## 2023-02-07 DIAGNOSIS — K449 Diaphragmatic hernia without obstruction or gangrene: Secondary | ICD-10-CM | POA: Diagnosis not present

## 2023-02-07 DIAGNOSIS — I10 Essential (primary) hypertension: Secondary | ICD-10-CM | POA: Diagnosis not present

## 2023-02-07 DIAGNOSIS — R1111 Vomiting without nausea: Secondary | ICD-10-CM | POA: Diagnosis not present

## 2023-02-07 DIAGNOSIS — R1013 Epigastric pain: Secondary | ICD-10-CM | POA: Diagnosis not present

## 2023-02-07 DIAGNOSIS — K3189 Other diseases of stomach and duodenum: Secondary | ICD-10-CM | POA: Diagnosis not present

## 2023-02-07 LAB — CBC
HCT: 36.8 % (ref 36.0–46.0)
Hemoglobin: 12.5 g/dL (ref 12.0–15.0)
MCH: 31.6 pg (ref 26.0–34.0)
MCHC: 34 g/dL (ref 30.0–36.0)
MCV: 93.2 fL (ref 80.0–100.0)
Platelets: 340 10*3/uL (ref 150–400)
RBC: 3.95 MIL/uL (ref 3.87–5.11)
RDW: 12.6 % (ref 11.5–15.5)
WBC: 11.5 10*3/uL — ABNORMAL HIGH (ref 4.0–10.5)
nRBC: 0 % (ref 0.0–0.2)

## 2023-02-07 LAB — COMPREHENSIVE METABOLIC PANEL
ALT: 21 U/L (ref 0–44)
AST: 30 U/L (ref 15–41)
Albumin: 4.6 g/dL (ref 3.5–5.0)
Alkaline Phosphatase: 79 U/L (ref 38–126)
Anion gap: 13 (ref 5–15)
BUN: 19 mg/dL (ref 6–20)
CO2: 25 mmol/L (ref 22–32)
Calcium: 10.3 mg/dL (ref 8.9–10.3)
Chloride: 102 mmol/L (ref 98–111)
Creatinine, Ser: 0.71 mg/dL (ref 0.44–1.00)
GFR, Estimated: >60 mL/min (ref >60)
Glucose, Bld: 157 mg/dL — ABNORMAL HIGH (ref 70–99)
Potassium: 3.6 mmol/L (ref 3.5–5.1)
Sodium: 140 mmol/L (ref 135–145)
Total Bilirubin: 0.6 mg/dL (ref 0.3–1.2)
Total Protein: 7.4 g/dL (ref 6.5–8.1)

## 2023-02-07 LAB — LIPASE, BLOOD: Lipase: 33 U/L (ref 11–51)

## 2023-02-07 MED ORDER — ONDANSETRON HCL 4 MG/2ML IJ SOLN
4.0000 mg | Freq: Once | INTRAMUSCULAR | Status: AC
  Administered 2023-02-07: 4 mg via INTRAVENOUS
  Filled 2023-02-07: qty 2

## 2023-02-07 MED ORDER — HYDROMORPHONE HCL 1 MG/ML IJ SOLN
1.0000 mg | Freq: Once | INTRAMUSCULAR | Status: AC
  Administered 2023-02-07: 1 mg via INTRAVENOUS
  Filled 2023-02-07: qty 1

## 2023-02-07 MED ORDER — ONDANSETRON 4 MG PO TBDP: 4.0000 mg | ORAL_TABLET | Freq: Three times a day (TID) | ORAL | 0 refills | Status: DC | PRN

## 2023-02-07 MED ORDER — IOHEXOL 300 MG/ML  SOLN
100.0000 mL | Freq: Once | INTRAMUSCULAR | Status: AC | PRN
  Administered 2023-02-07: 100 mL via INTRAVENOUS

## 2023-02-07 NOTE — ED Provider Notes (Signed)
[place] EMERGENCY DEPARTMENT AT [facility] Provider Note   CSN: [id number] Arrival date & time: 02/07/23  4098     History  Chief Complaint  Patient presents with   Emesis    Cheryl Rowe is a 62 y.o. female.   Emesis  62 year old female, prior history of cholecystectomy approximately 18 years ago, no history of pancreatitis, no alcohol or drug use, non-smoker, states that over the last 6 months she has had 3 discrete episodes of abdominal pain associated with vomiting, this 1 came on at 9:00 last night and was quite severe, has been present throughout the night and associated with several episodes of diarrhea 1 of which appear black.  She did try to drink some type of medication she is unsure if it was Pepto-Bismol, she has not been traveling nor has she been around anybody who has been sick.  The pain does not radiate to her back and is not associated with urinary symptoms.    Home Medications Prior to Admission medications   Medication Sig Start Date End Date Taking? Authorizing Provider  ondansetron (ZOFRAN-ODT) 4 MG disintegrating tablet Take 1 tablet (4 mg total) by mouth every 8 (eight) hours as needed for nausea. 02/07/23  Yes Eber Hong, MD  amLODipine (NORVASC) 5 MG tablet Take 1 tablet (5 mg total) by mouth daily. 09/07/22   Luking, Jonna Coup, MD  Flaxseed, Linseed, (FLAX SEED OIL) 1000 MG CAPS Take 1,000 mg by mouth daily.    [provider]  Multiple Vitamin (MULTIVITAMIN WITH MINERALS) TABS tablet Take 1 tablet by mouth daily.    [provider]  oxyCODONE-acetaminophen (PERCOCET) 10-325 MG tablet Take 1 tablet by mouth every 4 (four) hours as needed for pain. No more than 5 tablets a day 12/07/22   Babs Sciara, MD  oxyCODONE-acetaminophen (PERCOCET) 10-325 MG tablet 1 every 4 hours as needed pain maximum 5/day 12/07/22   Babs Sciara, MD  oxyCODONE-acetaminophen (PERCOCET) 10-325 MG tablet 1 every 4 hours p.o. and pain maximum 5/day  12/07/22   Luking, Jonna Coup, MD  pantoprazole (PROTONIX) 40 MG tablet Take 1 tablet (40 mg total) by mouth daily. 05/26/22   Babs Sciara, MD  pregabalin (LYRICA) 25 MG capsule Take 1 capsule (25 mg total) by mouth 2 (two) times daily. 12/07/22   Babs Sciara, MD  valsartan-hydrochlorothiazide (DIOVAN-HCT) 160-12.5 MG tablet Take 1 tablet by mouth daily. 05/26/22   Babs Sciara, MD  venlafaxine XR (EFFEXOR-XR) 75 MG 24 hr capsule TAKE 3 CAPSULES BY MOUTH EVERY DAY 07/07/22   Babs Sciara, MD  vitamin E 400 UNIT capsule Take 400 Units by mouth daily.    [provider]  Zoledronic Acid (RECLAST IV) Inject 5 mg into the vein once. yearly    [provider]      Allergies    Pravastatin and Cymbalta [duloxetine hcl]    Review of Systems   Review of Systems  Gastrointestinal:  Positive for vomiting.  All other systems reviewed and are negative.   Physical Exam Updated Vital Signs BP (!) 153/98   Pulse 79   Temp 97.8 F (36.6 C) (Oral)   Resp 16   Ht 1.626 m (5\' 4" )   Wt 72.6 kg   SpO2 100%   BMI 27.46 kg/m  Physical Exam Vitals and nursing note reviewed.  Constitutional:      General: She is not in acute distress.    Appearance: She is well-developed.  HENT:     Head: Normocephalic and atraumatic.     Mouth/Throat:     Pharynx: No oropharyngeal exudate.  Eyes:     General: No scleral icterus.       Right eye: No discharge.        Left eye: No discharge.     Conjunctiva/sclera: Conjunctivae normal.     Pupils: Pupils are equal, round, and reactive to light.  Neck:     Thyroid: No thyromegaly.     Vascular: No JVD.  Cardiovascular:     Rate and Rhythm: Normal rate and regular rhythm.     Heart sounds: Normal heart sounds. No murmur heard.    No friction rub. No gallop.  Pulmonary:     Effort: Pulmonary effort is normal. No respiratory distress.     Breath sounds: Normal breath sounds. No wheezing or rales.  Abdominal:     General: Bowel sounds  are normal. There is no distension.     Palpations: Abdomen is soft. There is no mass.     Tenderness: There is abdominal tenderness.     Comments: The abdomen is extremely soft but there is mid and upper abdominal tenderness, no Murphy sign, no pain McBurney's point  Musculoskeletal:        General: No tenderness. Normal range of motion.     Cervical back: Normal range of motion and neck supple.     Right lower leg: No edema.     Left lower leg: No edema.  Lymphadenopathy:     Cervical: No cervical adenopathy.  Skin:    General: Skin is warm and dry.     Findings: No erythema or rash.  Neurological:     Mental Status: She is alert.     Coordination: Coordination normal.  Psychiatric:        Behavior: Behavior normal.     ED Results / Procedures / Treatments   Labs (all labs ordered are listed, but only abnormal results are displayed) Labs Reviewed  COMPREHENSIVE METABOLIC PANEL - Abnormal; Notable for the following components:      Result Value   Glucose, Bld 157 (*)    All other components within normal limits  CBC - Abnormal; Notable for the following components:   WBC 11.5 (*)    All other components within normal limits  LIPASE, BLOOD  URINALYSIS, ROUTINE W REFLEX MICROSCOPIC    EKG EKG Interpretation  Date/Time:  Sunday February 07 2023 08:07:33 EDT Ventricular Rate:  76 PR Interval:  162 QRS Duration: 100 QT Interval:  429 QTC Calculation: 483 R Axis:   53 Text Interpretation: Sinus rhythm Baseline wander in lead(s) V3 since last tracing no significant change Confirmed by Eber Hong ([id number]) on 02/07/2023 8:29:07 AM  Radiology CT ABDOMEN PELVIS W CONTRAST  Result Date: 02/07/2023 CLINICAL DATA:  Abdominal pain, acute, nonlocalized. Upper abdominal pain, vomiting and diarrhea since last night. EXAM: CT ABDOMEN AND PELVIS WITH CONTRAST TECHNIQUE: Multidetector CT imaging of the abdomen and pelvis was performed using the standard protocol following bolus  administration of intravenous contrast. RADIATION DOSE REDUCTION: This exam was performed according to the departmental dose-optimization program which includes automated exposure control, adjustment of the mA and/or kV according to patient size and/or use of iterative reconstruction technique. CONTRAST:  OMNIPAQUE IOHEXOL 300 MG/ML  SOLN COMPARISON:  04/11/2015 CT abdomen/pelvis. FINDINGS: Lower chest: No significant pulmonary nodules or acute consolidative airspace disease. Hepatobiliary: Normal liver size. No liver mass. Cholecystectomy. Mild diffuse intrahepatic biliary  ductal dilatation and dilated common bile duct with diameter 9 mm with smooth distal tapering, not significantly changed since 2016 CT, compatible with chronic post cholecystectomy effect. Pancreas: Normal, with no mass or duct dilation. Spleen: Normal size. No mass. Adrenals/Urinary Tract: Normal adrenals. No hydronephrosis. Subcentimeter hypodense bilateral renal cortical lesions are too small to characterize, for which no follow-up imaging is recommended. Symmetric normal contrast nephrograms. Normal bladder. Stomach/Bowel: Moderate to large hiatal hernia. Stomach is nondistended and otherwise normal. Normal caliber small bowel with no small bowel wall thickening. Right lower quadrant appendix is mildly dilated (9 mm diameter) with slight appendiceal wall thickening (series 2/image 69). No significant periappendiceal fat stranding. No abscess or free air. Findings are equivocal for acute appendicitis. Normal large bowel with no diverticulosis, large bowel wall thickening or pericolonic fat stranding. Vascular/Lymphatic: Atherosclerotic nonaneurysmal abdominal aorta. Patent portal, splenic, hepatic and renal veins. No pathologically enlarged lymph nodes in the abdomen or pelvis. Reproductive: Grossly normal uterus.  No adnexal mass. Other: No pneumoperitoneum, ascites or focal fluid collection. Musculoskeletal: No aggressive appearing  focal osseous lesions. Moderate thoracolumbar spondylosis. Status post bilateral posterior spinal fusion L4-S1. For moderate long segment levocurvature of the lumbar spine. IMPRESSION: 1. Mildly dilated right lower quadrant appendix with slight appendiceal wall thickening. No significant periappendiceal fat stranding. No abscess or free air. Findings are equivocal for acute appendicitis. 2. Moderate to large hiatal hernia. 3.  Aortic Atherosclerosis (ICD10-I70.0). Electronically Signed   By: Delbert Phenix M.D.   On: 02/07/2023 10:49    Procedures Procedures    Medications Ordered in ED Medications  HYDROmorphone (DILAUDID) injection 1 mg (1 mg Intravenous Given 02/07/23 0929)  ondansetron (ZOFRAN) injection 4 mg (4 mg Intravenous Given 02/07/23 0929)  iohexol (OMNIPAQUE) 300 MG/ML solution 100 mL (100 mLs Intravenous Contrast Given 02/07/23 0951)    ED Course/ Medical Decision Making/ A&P                             Medical Decision Making Amount and/or Complexity of Data Reviewed Labs: ordered. Radiology: ordered.  Risk Prescription drug management.    This patient presents to the ED for concern of abdominal pain with vomiting, this involves an extensive number of treatment options, and is a complaint that carries with it a high risk of complications and morbidity.  The differential diagnosis includes gastritis, pancreatitis, gastroenteritis, bowel obstruction, volvulus, intussusception   Co morbidities that complicate the patient evaluation  Overweight, prior cholecystectomy   Additional history obtained:  Additional history obtained from medical record External records from outside source obtained and reviewed including no prior emergency department visits for the 2 prior episodes, has been seen for hyperparathyroidism by general surgeon and told that this can cause some abdominal discomfort.  Also followed for anxiety and depression by family doctor Has not had any abdominal  imaging in the last few years   Lab Tests:  I Ordered, and personally interpreted labs.  The pertinent results include: BC with minimal leukocytosis, no other significant lab abnormalities   Imaging Studies ordered:  I ordered imaging studies including CT scan of the abdomen and pelvis I independently visualized and interpreted imaging which showed dilation in the appendix but no signs of periappendiceal inflammation I agree with the radiologist interpretation   Cardiac Monitoring: / EKG:  The patient was maintained on a cardiac monitor.  I personally viewed and interpreted the cardiac monitored which showed an underlying rhythm of: Normal sinus rhythm  Problem List / ED Course / Critical interventions / Medication management  Patient was given medications, she improved significantly, she is no longer nauseated and on repeat exam the patient has no tenderness except for a small amount in the epigastrium but absolutely no tenderness in the lower abdomen, she is comfortable going home to follow-up in the outpatient setting. I have discussed with the patient at the bedside the results, and the meaning of these results.  They have expressed her understanding to the need for follow-up with primary care physician I have reviewed the patients home medicines and have made adjustments as needed   Social Determinants of Health:  None   Test / Admission - Considered:  Considered admission but the patient is essentially resolved with normal vitals except for mild hypertension, stable for discharge         Final Clinical Impression(s) / ED Diagnoses Final diagnoses:  Nausea and vomiting, unspecified vomiting type  Epigastric pain    Rx / DC Orders ED Discharge Orders          Ordered    ondansetron (ZOFRAN-ODT) 4 MG disintegrating tablet  Every 8 hours PRN        02/07/23 1120              Eber Hong, MD 02/07/23 1121

## 2023-02-07 NOTE — Discharge Instructions (Signed)
Thankfully your testing has been unremarkable and reassuring.  I have given you a medication called Zofran which I want you to take every 6 hours as needed for nausea.  This dissolves in your mouth.  Zofran is a medication which can help with nausea.  You may take 4 mg by mouth every 6 hours as needed if you are an adult, if your child under the age of 6 take half of a tablet or 2 mg every 6 hours as needed.  This should dissolve on your tongue within a short timeframe.  Wait about 30 minutes after taking it to help with drinking clear liquids.  As we talked about the CAT scan showed that your appendix was a little bit bigger than normal but you are not tender there and it is not inflamed.  If you do develop increasing or worsening abdominal pain especially on the right I want you to return to the ER immediately.  Thank you for allowing [place] to treat you in the emergency department today.  After reviewing your examination and potential testing that was done it appears that you are safe to go home.  I would like for you to follow-up with your doctor within the next several days, have them obtain your results and follow-up with them to review all of these tests.  If you should develop severe or worsening symptoms return to the emergency department immediately

## 2023-02-07 NOTE — ED Triage Notes (Signed)
Pt reports vomiting and diarrhea with upper abd pain since last night.

## 2023-02-11 ENCOUNTER — Ambulatory Visit: Payer: PRIVATE HEALTH INSURANCE | Admitting: "Endocrinology

## 2023-02-11 ENCOUNTER — Telehealth: Payer: Self-pay

## 2023-02-11 NOTE — Telephone Encounter (Signed)
Transition Care Management Unsuccessful Follow-up Telephone Call  Date of discharge and from where:  02/07/2023 [facility]  Attempts:  1st Attempt  Reason for unsuccessful TCM follow-up call:  Left voice message  [facility]  [facility] Population Health Community Resource Care Guide   [email or web address]  ?? [phone]   Website: [email or web address]  [email or web address]

## 2023-02-12 ENCOUNTER — Ambulatory Visit (INDEPENDENT_AMBULATORY_CARE_PROVIDER_SITE_OTHER): Payer: Medicare HMO | Admitting: Family Medicine

## 2023-02-12 ENCOUNTER — Telehealth: Payer: Self-pay

## 2023-02-12 VITALS — BP 124/81 | HR 90 | Ht 64.0 in | Wt 155.4 lb

## 2023-02-12 DIAGNOSIS — K449 Diaphragmatic hernia without obstruction or gangrene: Secondary | ICD-10-CM | POA: Diagnosis not present

## 2023-02-12 MED ORDER — ROSUVASTATIN CALCIUM 10 MG PO TABS
ORAL_TABLET | ORAL | 1 refills | Status: DC
Start: 2023-02-12 — End: 2023-08-03

## 2023-02-12 NOTE — Progress Notes (Signed)
   Subjective:    Patient ID: Cheryl Rowe, female    DOB: April 01, 1961, 62 y.o.   MRN: [id number]  HPI Patient arrives today for hospital follow up.Patient states no concerns or issues today.  Patient with several intermittent episodes of nausea vomiting epigastric pain denies any sweats chills wheezing difficulty breathing states energy level doing okay had a CAT scan which showed large hiatal hernia  Review of Systems     Objective:   Physical Exam General-in no acute distress Eyes-no discharge Lungs-respiratory rate normal, CTA CV-no murmurs,RRR Extremities skin warm dry no edema Neuro grossly normal Behavior normal, alert She does not have any tenderness is in the right lower quadrant her pain was in the upper abdomen where the hiatal hernia was       Assessment & Plan:  It is my feeling that this is due to the hiatal hernia It will be important for the patient to be followed by general surgery She has some testing coming up for a hyperparathyroid evaluation for possible surgery This takes precedent This is the third time she has had these abdominal symptoms and she describes a fullness when she gets sick that up into her chest which is consistent with hiatal hernia.

## 2023-02-12 NOTE — Telephone Encounter (Signed)
Transition Care Management Unsuccessful Follow-up Telephone Call  Date of discharge and from where:  02/07/2023 [facility]  Attempts:  2nd Attempt  Reason for unsuccessful TCM follow-up call:  Left voice message  Aadvik Roker [facility]  [facility] Population Health Community Resource Care Guide   [email or web address]  ?? [phone]   Website: [email or web address]  [email or web address]

## 2023-02-13 ENCOUNTER — Encounter: Payer: Self-pay | Admitting: Family Medicine

## 2023-02-13 DIAGNOSIS — K449 Diaphragmatic hernia without obstruction or gangrene: Secondary | ICD-10-CM

## 2023-02-16 NOTE — Telephone Encounter (Signed)
Nurses-it would be my advice to put off any type of surgical treatment of hiatal hernia until after her parathyroid is taking care of   It would be okay to go ahead and do referral to [place] [facility] for large hiatal hernia  She has seen Dr. Gerrit Friends but I do not know if he does the hiatal hernias or another partner in the group does those  I think would be fine for her to see the specialist to get their opinion about her hiatal hernia but I would recommend putting off surgery for that until after treatment of her hyperparathyroidism  But it would be okay to go ahead with referral  Please share with Jasmine December as well thank you

## 2023-02-22 DIAGNOSIS — R112 Nausea with vomiting, unspecified: Secondary | ICD-10-CM | POA: Diagnosis not present

## 2023-02-22 DIAGNOSIS — E213 Hyperparathyroidism, unspecified: Secondary | ICD-10-CM | POA: Diagnosis not present

## 2023-02-22 DIAGNOSIS — M4126 Other idiopathic scoliosis, lumbar region: Secondary | ICD-10-CM | POA: Diagnosis not present

## 2023-02-22 DIAGNOSIS — K440 Diaphragmatic hernia with obstruction, without gangrene: Secondary | ICD-10-CM | POA: Diagnosis not present

## 2023-02-22 DIAGNOSIS — Z86010 Personal history of colonic polyps: Secondary | ICD-10-CM | POA: Diagnosis not present

## 2023-02-28 ENCOUNTER — Encounter: Payer: Self-pay | Admitting: Family Medicine

## 2023-03-01 ENCOUNTER — Ambulatory Visit (INDEPENDENT_AMBULATORY_CARE_PROVIDER_SITE_OTHER): Payer: Medicare HMO | Admitting: Gastroenterology

## 2023-03-01 ENCOUNTER — Encounter (INDEPENDENT_AMBULATORY_CARE_PROVIDER_SITE_OTHER): Payer: Self-pay | Admitting: Gastroenterology

## 2023-03-01 ENCOUNTER — Other Ambulatory Visit: Payer: Self-pay

## 2023-03-01 VITALS — BP 157/96 | HR 76 | Temp 97.8°F | Ht 64.0 in | Wt 162.6 lb

## 2023-03-01 DIAGNOSIS — R1013 Epigastric pain: Secondary | ICD-10-CM | POA: Diagnosis not present

## 2023-03-01 DIAGNOSIS — R1110 Vomiting, unspecified: Secondary | ICD-10-CM | POA: Insufficient documentation

## 2023-03-01 DIAGNOSIS — R1112 Projectile vomiting: Secondary | ICD-10-CM

## 2023-03-01 DIAGNOSIS — K449 Diaphragmatic hernia without obstruction or gangrene: Secondary | ICD-10-CM | POA: Diagnosis not present

## 2023-03-01 DIAGNOSIS — E210 Primary hyperparathyroidism: Secondary | ICD-10-CM | POA: Diagnosis not present

## 2023-03-01 HISTORY — PX: BIOPSY THYROID: PRO38

## 2023-03-01 MED ORDER — PEG 3350-KCL-NA BICARB-NACL 420 G PO SOLR: 4000.0000 mL | Freq: Once | ORAL | 0 refills | Status: DC

## 2023-03-01 NOTE — Progress Notes (Signed)
Vista Lawman , M.D. Gastroenterology & Hepatology [facility] Gastroenterology [address] [place], [place] [address] Primary Care Physician: Babs Sciara, MD [address] [place] [address]  Referring MD: Dr Michaell Cowing   Chief Complaint:  Vomiting Adalberto Ill pain   History of Present Illness:  Cheryl Rowe is a 62 y.o. female who presents for evaluation of vomiting in lieu of hiatal hernia.   Patient's report this started March 2024.  Patient has episodes of vomiting at least once a week always happens while she is sleeping she will wake up in the middle of the night with multiple episodes of nausea and vomiting nonbilious nonbloody.  This has led her to go to the ER and they found a moderate to large hiatal hernia.  She was seen by surgery and as preprocedure workup she is scheduled for manometry next week at [facility].  Patient never had an upper.  During the same time patient has oblique abdominal pain located in the epigastric area and she describes it as a dull ache.  Denies any melena or hematochezia.  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  Patient denies dysphagia is able to eat all her meals.  Denies any regurgitation.  She has been on antisecretory medications such as omeprazole and pantoprazole for a long time and that is due to underlying heartburn.  The patient denies having any  fever, chills, hematochezia, melena, hematemesis, abdominal distention,  jaundice, pruritus or unintentional weight loss.  Never had EGD   Colonoscopy 2019 :- One small polyp at the hepatic flexure, removed with a cold snare. Resected and retrieved. - Diverticulosis in the sigmoid colon. - External hemorrhoids.  TA Due : 2026  FHx: neg for any gastrointestinal/liver disease, no malignancies Social: neg smoking, alcohol or illicit drug use Surgical:  Cholecystectomy   Past Medical History: Past Medical History:  Diagnosis Date   ADHD (attention deficit hyperactivity disorder)    Arthritis    Depression    Family history of adverse reaction to anesthesia    sister has PONV   GERD (gastroesophageal reflux disease)    HTN (hypertension) 07/10/2021   Hyperlipidemia    Migraine    Scoliosis    Teenage   Spondylolisthesis of lumbosacral region    Wears glasses     Past Surgical History: Past Surgical History:  Procedure Laterality Date   CERVICAL ABLATION     CHOLECYSTECTOMY     COLONOSCOPY WITH PROPOFOL N/A 03/25/2018   Procedure: COLONOSCOPY WITH PROPOFOL;  Surgeon: Malissa Hippo, MD;  [facility];  Service: Endoscopy;  Laterality: N/A;  9:30   POLYPECTOMY  03/25/2018   Procedure: POLYPECTOMY;  Surgeon: Malissa Hippo, MD;  Location: [facility];  Service: Endoscopy;;  hepatic flexure   TONSILLECTOMY      Family History: Family History  Problem Relation Age of Onset   Hypertension Father    Arthritis Mother     Social History: Social History   Tobacco Use  Smoking Status Never  Smokeless Tobacco Never   Social History   Substance and Sexual Activity  Alcohol Use No   Social History   Substance and Sexual Activity  Drug Use No    Allergies: Allergies  Allergen Reactions   Pravastatin     Legs ache   Cymbalta [Duloxetine Hcl] Other (See Comments)    Aggressive     Medications: Current  Outpatient Medications  Medication Sig Dispense Refill   amLODipine (NORVASC) 5 MG tablet Take 1 tablet (5 mg total) by mouth daily. 90 tablet 1   Flaxseed, Linseed, (FLAX SEED OIL) 1000 MG CAPS Take 1,000 mg by mouth daily.     Multiple Vitamin (MULTIVITAMIN WITH MINERALS) TABS tablet Take 1 tablet by mouth daily.     ondansetron (ZOFRAN-ODT) 4 MG disintegrating tablet Take 1 tablet (4 mg total) by mouth every 8 (eight) hours as needed for nausea. 10 tablet 0   oxyCODONE-acetaminophen (PERCOCET)  10-325 MG tablet Take 1 tablet by mouth every 4 (four) hours as needed for pain. No more than 5 tablets a day 140 tablet 0   pantoprazole (PROTONIX) 40 MG tablet Take 1 tablet (40 mg total) by mouth daily. 90 tablet 1   pregabalin (LYRICA) 25 MG capsule Take 1 capsule (25 mg total) by mouth 2 (two) times daily. 60 capsule 2   rosuvastatin (CRESTOR) 10 MG tablet One on M W and Friday 36 tablet 1   valsartan-hydrochlorothiazide (DIOVAN-HCT) 160-12.5 MG tablet Take 1 tablet by mouth daily. 90 tablet 3   venlafaxine XR (EFFEXOR-XR) 75 MG 24 hr capsule TAKE 3 CAPSULES BY MOUTH EVERY DAY 270 capsule 2   vitamin E 400 UNIT capsule Take 400 Units by mouth daily.     Zoledronic Acid (RECLAST IV) Inject 5 mg into the vein once. yearly     No current facility-administered medications for this visit.    Review of Systems: GENERAL: negative for malaise, night sweats HEENT: No changes in hearing or vision, no nose bleeds or other nasal problems. NECK: Negative for lumps, goiter, pain and significant neck swelling RESPIRATORY: Negative for cough, wheezing CARDIOVASCULAR: Negative for chest pain, leg swelling, palpitations, orthopnea GI: SEE HPI MUSCULOSKELETAL: Negative for joint pain or swelling, back pain, and muscle pain. SKIN: Negative for lesions, rash PSYCH: Negative for sleep disturbance, mood disorder and recent psychosocial stressors. HEMATOLOGY Negative for prolonged bleeding, bruising easily, and swollen nodes. ENDOCRINE: Negative for cold or heat intolerance, polyuria, polydipsia and goiter. NEURO: negative for tremor, gait imbalance, syncope and seizures. The remainder of the review of systems is noncontributory.   Physical Exam: BP (!) 157/96 (BP Location: Left Arm, Patient Position: Sitting, Cuff Size: Large)   Pulse 76   Temp 97.8 F (36.6 C) (Temporal)   Ht 5\' 4"  (1.626 m)   Wt 162 lb 9.6 oz (73.8 kg)   BMI 27.91 kg/m  GENERAL: The patient is AO x3, in no acute  distress. HEENT: Head is normocephalic and atraumatic. EOMI are intact. Mouth is well hydrated and without lesions. NECK: Supple. No masses LUNGS: Clear to auscultation. No presence of rhonchi/wheezing/rales. Adequate chest expansion HEART: RRR, normal s1 and s2. ABDOMEN: Soft, nontender, no guarding, no peritoneal signs, and nondistended. BS +. No masses. EXTREMITIES: Without any cyanosis, clubbing, rash, lesions or edema. NEUROLOGIC: AOx3, no focal motor deficit. SKIN: no jaundice, no rashes   Imaging/Labs: as above  I personally reviewed and interpreted the available labs, imaging and endoscopic files.  Impression and Plan:  Cheryl Rowe is a 62 y.o. female who presents for evaluation of vomiting in lieu of hiatal hernia.   #Vomiting   Patient has episodes of vomiting always at night while she is sleeping , this may be due to underlying hiatal hernia or GERD  She was seen by surgery and planned for manometry at [facility] , and as pre-procedure workup EGD is indicated to rule out any structural  defect or any other lesion  Recommended holding PPI for atleast 10 days prior to the procedure to evaluate for GERD defining lesion such as LA Class C/D esophagitis and also EGD in this case can serve as screening for [place] for long standing GERD.   #Dyspepsia  Patient has new epigastric pain and positive for tenderness on exam.  As per ACG guideline, patient >age 1 with new upper Gi symptoms , EGD is indicated  [facility]  Patient had a high quality colonoscopy in 2019 with diminutive TA, Next colonoscopy due 2026  All questions were answered.      Vista Lawman  MD Gastroenterology and Hepatology [facility] Gastroenterology

## 2023-03-01 NOTE — Patient Instructions (Addendum)
It was nice to meet you !  1) will schedule for Upper endoscopy as discussed because you have symptomatic large hiatal which likely needs surgery , depending on manometry results  2) Hold Pantoprazole 10 days prior to the procedure  3) If any severe chest/Abdominal pain with intractable vomiting , please come to the ED  4) Continue to follow up with your PCP as you are hypertensive

## 2023-03-02 ENCOUNTER — Other Ambulatory Visit: Payer: Medicare HMO

## 2023-03-02 LAB — PTH, INTACT AND CALCIUM
Calcium: 10.6 mg/dL — ABNORMAL HIGH (ref 8.7–10.3)
PTH: 63 pg/mL (ref 15–65)

## 2023-03-03 ENCOUNTER — Inpatient Hospital Stay: Admission: RE | Admit: 2023-03-03 | Payer: Medicare HMO | Source: Ambulatory Visit

## 2023-03-03 ENCOUNTER — Telehealth (INDEPENDENT_AMBULATORY_CARE_PROVIDER_SITE_OTHER): Payer: Self-pay | Admitting: Gastroenterology

## 2023-03-03 ENCOUNTER — Encounter (INDEPENDENT_AMBULATORY_CARE_PROVIDER_SITE_OTHER): Payer: Self-pay

## 2023-03-03 NOTE — Telephone Encounter (Signed)
Left detailed message with pre op appt. Pre op scheduled for 03/24/23 at 2:45pm. Also sent my chart message

## 2023-03-05 ENCOUNTER — Other Ambulatory Visit: Payer: Self-pay | Admitting: Family Medicine

## 2023-03-05 ENCOUNTER — Other Ambulatory Visit (HOSPITAL_COMMUNITY)
Admission: RE | Admit: 2023-03-05 | Discharge: 2023-03-05 | Disposition: A | Discharge status: Home / Self Care | Payer: Medicare HMO | Source: Ambulatory Visit | Attending: Surgery | Admitting: Surgery

## 2023-03-05 ENCOUNTER — Ambulatory Visit
Admission: RE | Admit: 2023-03-05 | Discharge: 2023-03-05 | Disposition: A | Discharge status: Home / Self Care | Payer: Medicare HMO | Source: Ambulatory Visit | Attending: Surgery | Admitting: Surgery

## 2023-03-05 DIAGNOSIS — E041 Nontoxic single thyroid nodule: Secondary | ICD-10-CM

## 2023-03-08 LAB — CYTOLOGY - NON PAP

## 2023-03-08 NOTE — Progress Notes (Signed)
Good news!  Thyroid biopsy is benign.  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  I will contact patient to discuss timing of surgery.  Darnell Level, MD [facility] practice Office: [phone]

## 2023-03-09 DIAGNOSIS — E210 Primary hyperparathyroidism: Secondary | ICD-10-CM | POA: Diagnosis not present

## 2023-03-10 ENCOUNTER — Ambulatory Visit: Payer: Medicare HMO | Admitting: Family Medicine

## 2023-03-11 ENCOUNTER — Telehealth (INDEPENDENT_AMBULATORY_CARE_PROVIDER_SITE_OTHER): Payer: Self-pay | Admitting: Gastroenterology

## 2023-03-11 ENCOUNTER — Encounter: Payer: Self-pay | Admitting: Family Medicine

## 2023-03-11 ENCOUNTER — Ambulatory Visit (INDEPENDENT_AMBULATORY_CARE_PROVIDER_SITE_OTHER): Payer: Medicare HMO | Admitting: Family Medicine

## 2023-03-11 VITALS — BP 148/94 | HR 72 | Temp 98.1°F | Ht 64.0 in | Wt 164.0 lb

## 2023-03-11 DIAGNOSIS — K449 Diaphragmatic hernia without obstruction or gangrene: Secondary | ICD-10-CM

## 2023-03-11 DIAGNOSIS — I10 Essential (primary) hypertension: Secondary | ICD-10-CM | POA: Diagnosis not present

## 2023-03-11 DIAGNOSIS — Z79891 Long term (current) use of opiate analgesic: Secondary | ICD-10-CM

## 2023-03-11 DIAGNOSIS — M810 Age-related osteoporosis without current pathological fracture: Secondary | ICD-10-CM

## 2023-03-11 DIAGNOSIS — E7849 Other hyperlipidemia: Secondary | ICD-10-CM

## 2023-03-11 DIAGNOSIS — E210 Primary hyperparathyroidism: Secondary | ICD-10-CM | POA: Diagnosis not present

## 2023-03-11 MED ORDER — OXYCODONE-ACETAMINOPHEN 10-325 MG PO TABS: ORAL_TABLET | ORAL | 0 refills | Status: DC

## 2023-03-11 MED ORDER — PANTOPRAZOLE SODIUM 40 MG PO TBEC: 40.0000 mg | DELAYED_RELEASE_TABLET | Freq: Every day | ORAL | 1 refills | Status: DC

## 2023-03-11 MED ORDER — AMLODIPINE BESYLATE 10 MG PO TABS: 10.0000 mg | ORAL_TABLET | Freq: Every day | ORAL | 5 refills | Status: DC

## 2023-03-11 MED ORDER — OXYCODONE-ACETAMINOPHEN 10-325 MG PO TABS: 1.0000 | ORAL_TABLET | ORAL | 0 refills | Status: DC | PRN

## 2023-03-11 NOTE — Progress Notes (Signed)
Subjective:    Patient ID: Cheryl Rowe, female    DOB: Apr 09, 1961, 62 y.o.   MRN: [id number]  HPI Pt comes in today for a 3 month follow for pain management. Pt has no questions or concerns at this time. This patient was seen today for chronic pain  The medication list was reviewed and updated.   Location of Pain for which the patient has been treated with regarding narcotics: Patient with significant back pain discomfort has had previous surgery.  Unfortunately surgery failed to alleviate her pain.  She takes approximately 4/day sometimes 5  Onset of this pain: Present for years   -Compliance with medication: Good compliance  - Number patient states they take daily: 4 or 5/day  -Reason for ongoing use of opioids aching/pain despite supportive measures  What other measures have been tried outside of opioids Tylenol NSAIDs surgery injections physical therapy  In the ongoing specialists regarding this condition back specialist  -when was the last dose patient took?  Earlier today  The patient was advised the importance of maintaining medication and not using illegal substances with these.  Here for refills and follow up  The patient was educated that we can provide 3 monthly scripts for their medication, it is their responsibility to follow the instructions.  Side effects or complications from medications: None  Patient is aware that pain medications are meant to minimize the severity of the pain to allow their pain levels to improve to allow for better function. They are aware of that pain medications cannot totally remove their pain.  Due for UDT ( at least once per year) (pain management contract is also completed at the time of the UDT): January 2024  Scale of 1 to 10 ( 1 is least 10 is most) Your pain level without the medicine: 8 Your pain level with medication 5  Scale 1 to 10 ( 1-helps very little, 10 helps very well) How well does your pain medication reduce  your pain so you can function better through out the day? 8  Quality of the pain: Throbbing aching  Persistence of the pain: Present all the time  Modifying factors: Worse with activity  Outpatient Encounter Medications as of 03/11/2023  Medication Sig Note   oxyCODONE-acetaminophen (PERCOCET) 10-325 MG tablet 1 every 4 hours as needed pain maximum 5/day to last 30 days    oxyCODONE-acetaminophen (PERCOCET) 10-325 MG tablet 1 every 4 hours as needed pain maximum 5/day to last 30 days    amLODipine (NORVASC) 10 MG tablet Take 1 tablet (10 mg total) by mouth daily.    Flaxseed, Linseed, (FLAX SEED OIL) 1000 MG CAPS Take 1,000 mg by mouth daily.    Multiple Vitamin (MULTIVITAMIN WITH MINERALS) TABS tablet Take 1 tablet by mouth daily.    ondansetron (ZOFRAN-ODT) 4 MG disintegrating tablet Take 1 tablet (4 mg total) by mouth every 8 (eight) hours as needed for nausea.    oxyCODONE-acetaminophen (PERCOCET) 10-325 MG tablet Take 1 tablet by mouth every 4 (four) hours as needed for pain. No more than 5 tablets a day    pantoprazole (PROTONIX) 40 MG tablet Take 1 tablet (40 mg total) by mouth daily.    rosuvastatin (CRESTOR) 10 MG tablet One on M W and Friday    valsartan-hydrochlorothiazide (DIOVAN-HCT) 160-12.5 MG tablet TAKE 1 TABLET BY MOUTH EVERY DAY    venlafaxine XR (EFFEXOR-XR) 75 MG 24 hr capsule TAKE 3 CAPSULES BY MOUTH EVERY DAY    vitamin E 400  UNIT capsule Take 400 Units by mouth daily.    Zoledronic Acid (RECLAST IV) Inject 5 mg into the vein once. yearly    [DISCONTINUED] amLODipine (NORVASC) 5 MG tablet Take 1 tablet (5 mg total) by mouth daily.    [DISCONTINUED] oxyCODONE-acetaminophen (PERCOCET) 10-325 MG tablet Take 1 tablet by mouth every 4 (four) hours as needed for pain. No more than 5 tablets a day    [DISCONTINUED] pantoprazole (PROTONIX) 40 MG tablet Take 1 tablet (40 mg total) by mouth daily. 03/01/2023: Bid per patient.    [DISCONTINUED] pregabalin (LYRICA) 25 MG capsule  Take 1 capsule (25 mg total) by mouth 2 (two) times daily. (Patient not taking: Reported on 03/11/2023)    No facility-administered encounter medications on file as of 03/11/2023.    Past Medical History:  Diagnosis Date   ADHD (attention deficit hyperactivity disorder)    Arthritis    Depression    Family history of adverse reaction to anesthesia    sister has PONV   GERD (gastroesophageal reflux disease)    HTN (hypertension) 07/10/2021   Hyperlipidemia    Migraine    Scoliosis    Teenage   Spondylolisthesis of lumbosacral region    Wears glasses     Encounter for long-term opiate analgesic use  Osteoporosis without current pathological fracture, unspecified osteoporosis type  Other hyperlipidemia  Hyperparathyroidism, primary (HCC)  Hiatal hernia  Primary hypertension      Review of Systems     Objective:   Physical Exam  General-in no acute distress Eyes-no discharge Lungs-respiratory rate normal, CTA CV-no murmurs,RRR Extremities skin warm dry no edema Neuro grossly normal Behavior normal, alert       Assessment & Plan:  1. Encounter for long-term opiate analgesic use The patient was seen in followup for chronic pain. A review over at their current pain status was discussed. Drug registry was checked. Prescriptions were given.  Regular follow-up recommended. Discussion was held regarding the importance of compliance with medication as well as pain medication contract.  Patient was informed that medication may cause drowsiness and should not be combined  with other medications/alcohol or street drugs. If the patient feels medication is causing altered alertness then do not drive or operate dangerous equipment.  Should be noted that the patient appears to be meeting appropriate use of opioids and response.  Evidenced by improved function and decent pain control without significant side effects and no evidence of overt aberrancy issues.  Upon discussion  with the patient today they understand that opioid therapy is optional and they feel that the pain has been refractory to reasonable conservative measures and is significant and affecting quality of life enough to warrant ongoing therapy and wishes to continue opioids.  Refills were provided. PDMP checked Pain medication discussed with the patient She states she is keeping it at 4 to 5/day   2. Osteoporosis without current pathological fracture, unspecified osteoporosis type Continue current measures  3. Other hyperlipidemia Healthy diet  4. Hyperparathyroidism, primary (HCC) More than likely will have surgery within the next few weeks  5. Hiatal hernia At some point possible surgery after her other surgery  6. Primary hypertension Add blood pressure medicine follow-up in a few weeks to recheck blood pressure

## 2023-03-11 NOTE — Telephone Encounter (Signed)
FYI-Pt left voicemail needing to cancel EGD for 03/26/23 at 11:00 am with Dr.Ahmed. pt will be having thyroid surgery on 7/22. Advised pt to call after her follow up from surgery and we can get her scheduled.  Will send message to Endo

## 2023-03-12 ENCOUNTER — Encounter: Payer: Self-pay | Admitting: "Endocrinology

## 2023-03-12 ENCOUNTER — Ambulatory Visit (INDEPENDENT_AMBULATORY_CARE_PROVIDER_SITE_OTHER): Payer: Medicare HMO | Admitting: "Endocrinology

## 2023-03-12 VITALS — BP 132/84 | HR 68 | Ht 64.0 in | Wt 162.0 lb

## 2023-03-12 DIAGNOSIS — D126 Benign neoplasm of colon, unspecified: Secondary | ICD-10-CM | POA: Diagnosis not present

## 2023-03-12 DIAGNOSIS — R112 Nausea with vomiting, unspecified: Secondary | ICD-10-CM | POA: Diagnosis not present

## 2023-03-12 DIAGNOSIS — E042 Nontoxic multinodular goiter: Secondary | ICD-10-CM

## 2023-03-12 DIAGNOSIS — E559 Vitamin D deficiency, unspecified: Secondary | ICD-10-CM

## 2023-03-12 DIAGNOSIS — M810 Age-related osteoporosis without current pathological fracture: Secondary | ICD-10-CM | POA: Diagnosis not present

## 2023-03-12 DIAGNOSIS — E210 Primary hyperparathyroidism: Secondary | ICD-10-CM | POA: Diagnosis not present

## 2023-03-12 DIAGNOSIS — E782 Mixed hyperlipidemia: Secondary | ICD-10-CM | POA: Diagnosis not present

## 2023-03-12 DIAGNOSIS — E213 Hyperparathyroidism, unspecified: Secondary | ICD-10-CM | POA: Diagnosis not present

## 2023-03-12 DIAGNOSIS — K440 Diaphragmatic hernia with obstruction, without gangrene: Secondary | ICD-10-CM | POA: Diagnosis not present

## 2023-03-12 DIAGNOSIS — Z86010 Personal history of colonic polyps: Secondary | ICD-10-CM | POA: Diagnosis not present

## 2023-03-12 NOTE — Progress Notes (Signed)
03/12/2023, 2:08 PM   Endocrinology follow-up note  Cheryl Rowe is a 62 y.o.-year-old female, referred by her PCP, Dr. Gerda Diss for evaluation for hypercalcemia/primary hyperparathyroidism.  She also has osteoporosis on ongoing Reclast treatment. Past Medical History:  Diagnosis Date   ADHD (attention deficit hyperactivity disorder)    Arthritis    Depression    Family history of adverse reaction to anesthesia    sister has PONV   GERD (gastroesophageal reflux disease)    HTN (hypertension) 07/10/2021   Hyperlipidemia    Migraine    Scoliosis    Teenage   Spondylolisthesis of lumbosacral region    Wears glasses     Past Surgical History:  Procedure Laterality Date   BIOPSY THYROID  03/2023   CERVICAL ABLATION     CHOLECYSTECTOMY     COLONOSCOPY WITH PROPOFOL N/A 03/25/2018   Procedure: COLONOSCOPY WITH PROPOFOL;  Surgeon: Malissa Hippo, MD;  [facility]: [facility];  Service: Endoscopy;  Laterality: N/A;  9:30   POLYPECTOMY  03/25/2018   Procedure: POLYPECTOMY;  Surgeon: Malissa Hippo, MD;  Location: [facility];  Service: Endoscopy;;  hepatic flexure   TONSILLECTOMY      Social History   Tobacco Use   Smoking status: Never   Smokeless tobacco: Never  Vaping Use   Vaping status: Never Used  Substance Use Topics   Alcohol use: No   Drug use: No    Outpatient Encounter Medications as of 03/12/2023  Medication Sig   amLODipine (NORVASC) 10 MG tablet Take 1 tablet (10 mg total) by mouth daily.   Flaxseed, Linseed, (FLAX SEED OIL) 1000 MG CAPS Take 1,000 mg by mouth daily.   Multiple Vitamin (MULTIVITAMIN WITH MINERALS) TABS tablet Take 1 tablet by mouth daily.   ondansetron (ZOFRAN-ODT) 4 MG disintegrating tablet Take 1 tablet (4 mg total) by mouth every 8 (eight) hours as needed for nausea.   oxyCODONE-acetaminophen (PERCOCET) 10-325 MG tablet Take 1 tablet by mouth every 4 (four) hours as needed for  pain. No more than 5 tablets a day   oxyCODONE-acetaminophen (PERCOCET) 10-325 MG tablet 1 every 4 hours as needed pain maximum 5/day to last 30 days   oxyCODONE-acetaminophen (PERCOCET) 10-325 MG tablet 1 every 4 hours as needed pain maximum 5/day to last 30 days   pantoprazole (PROTONIX) 40 MG tablet Take 1 tablet (40 mg total) by mouth daily.   rosuvastatin (CRESTOR) 10 MG tablet One on M W and Friday   valsartan-hydrochlorothiazide (DIOVAN-HCT) 160-12.5 MG tablet TAKE 1 TABLET BY MOUTH EVERY DAY   venlafaxine XR (EFFEXOR-XR) 75 MG 24 hr capsule TAKE 3 CAPSULES BY MOUTH EVERY DAY   vitamin E 400 UNIT capsule Take 400 Units by mouth daily.   Zoledronic Acid (RECLAST IV) Inject 5 mg into the vein once. yearly   No facility-administered encounter medications on file as of 03/12/2023.    Allergies  Allergen Reactions   Pravastatin     Legs ache   Cymbalta [Duloxetine Hcl] Other (See Comments)    Aggressive      HPI  Cheryl Rowe was diagnosed with mild primary hyperparathyroidism with intermittent hypercalcemia associated with high PTH.  She has confirmed parathyroid adenoma . She is being prepared for parathyroidectomy at the end of this month.  She missed her last appointment.   She does have osteoporosis as potential complication of primary hyperparathyroidism,  missed her Reclast infusion in 2023.  Her most recent labs show calcium ranging between 10.6-11.2, and PTH between 63-80.   She previously had several instances of hypercalcemia and high PTH.   Her last Reclast infusion was in October 2022-status post 4 injections.  A trial of oral Fosamax was not tolerable for her.   -Review of her last bone density in July 2023 showed femoral neck T score of -3.1.      No interval falls/fractures.    She is currently on vitamin D 2000 units daily supplement.  She is not on calcium supplement.   -  She has spondylolisthesis of lumbosacral spine .  No prior history of fragility  fractures or falls. No history of  kidney stones.      She is 12 years postmenopausal.     -She has normal renal function. she is not on HCTZ or other thiazide therapy.  Patient takes multiple supplements of vitamins and elements including calcium/magnesium/zinc and vitamin D.  Did so for several years.   she does not have a family history of hypercalcemia, pituitary tumors, thyroid cancer, or osteoporosis.   I reviewed her chart and she also has a history of multinodular goiter status post biopsy of right-sided thyroid nodule.  FNA results are reviewed showing benign follicular nodule in September 2017.  She underwent repeat fine-needle aspiration of right thyroid nodule with benign outcomes.  She has severe dyslipidemia , not on statins. She does not tolerate statins.   ROS: Limited as above.  PE: BP 132/84   Pulse 68   Ht 5\' 4"  (1.626 m)   Wt 162 lb (73.5 kg)   BMI 27.81 kg/m  Wt Readings from Last 3 Encounters:  03/12/23 162 lb (73.5 kg)  03/11/23 164 lb (74.4 kg)  03/01/23 162 lb 9.6 oz (73.8 kg)    CMP     Component Value Date/Time   NA 140 02/07/2023 0832   NA 141 09/25/2022 0839   K 3.6 02/07/2023 0832   CL 102 02/07/2023 0832   CO2 25 02/07/2023 0832   GLUCOSE 157 (H) 02/07/2023 0832   BUN 19 02/07/2023 0832   BUN 23 09/25/2022 0839   CREATININE 0.71 02/07/2023 0832   CALCIUM 10.6 (H) 03/01/2023 1218   PROT 7.4 02/07/2023 0832   PROT 6.9 09/25/2022 0839   ALBUMIN 4.6 02/07/2023 0832   ALBUMIN 4.9 09/25/2022 0839   AST 30 02/07/2023 0832   ALT 21 02/07/2023 0832   ALKPHOS 79 02/07/2023 0832   BILITOT 0.6 02/07/2023 0832   BILITOT 0.2 09/25/2022 0839   GFRNONAA >60 02/07/2023 0832   GFRAA 112 04/26/2020 1158     Lipid Panel ( most recent) Lipid Panel     Component Value Date/Time   CHOL 236 (H) 09/25/2022 0839   TRIG 97 09/25/2022 0839   HDL 86 09/25/2022 0839   CHOLHDL 2.7 09/25/2022 0839   CHOLHDL 4.0 03/15/2014 1259   VLDL 40 03/15/2014 1259    LDLCALC 133 (H) 09/25/2022 0839      Lab Results  Component Value Date   TSH 1.320 09/25/2022   TSH 0.759 11/13/2021   TSH 1.790 12/06/2017   TSH 1.080 09/27/2017   TSH 0.249 (L) 08/27/2017   TSH 1.050 04/22/2016   FREET4 1.11  09/25/2022   FREET4 1.00 11/13/2021   FREET4 1.33 12/06/2017   FREET4 1.07 09/27/2017   FREET4 0.79 (L) 04/22/2016    Recent Results (from the past 2160 hour(s))  Lipase, blood     Status: None   Collection Time: 02/07/23  8:32 AM  Result Value Ref Range   Lipase 33 11 - 51 U/L    Comment: Performed at [facility], [address], [place], [place] [address]  Comprehensive metabolic panel     Status: Abnormal   Collection Time: 02/07/23  8:32 AM  Result Value Ref Range   Sodium 140 135 - 145 mmol/L   Potassium 3.6 3.5 - 5.1 mmol/L   Chloride 102 98 - 111 mmol/L   CO2 25 22 - 32 mmol/L   Glucose, Bld 157 (H) 70 - 99 mg/dL    Comment: Glucose reference range applies only to samples taken after fasting for at least 8 hours.   BUN 19 6 - 20 mg/dL   Creatinine, Ser 6.04 0.44 - 1.00 mg/dL   Calcium 54.0 8.9 - 98.1 mg/dL   Total Protein 7.4 6.5 - 8.1 g/dL   Albumin 4.6 3.5 - 5.0 g/dL   AST 30 15 - 41 U/L   ALT 21 0 - 44 U/L   Alkaline Phosphatase 79 38 - 126 U/L   Total Bilirubin 0.6 0.3 - 1.2 mg/dL   GFR, Estimated >19 >14 mL/min    Comment: (NOTE) Calculated using the CKD-EPI Creatinine Equation (2021)    Anion gap 13 5 - 15    Comment: Performed at [facility], [address], [place], [place] [address]  CBC     Status: Abnormal   Collection Time: 02/07/23  8:32 AM  Result Value Ref Range   WBC 11.5 (H) 4.0 - 10.5 K/uL   RBC 3.95 3.87 - 5.11 MIL/uL   Hemoglobin 12.5 12.0 - 15.0 g/dL   HCT 62.1 30.8 - 65.7 %   MCV 93.2 80.0 - 100.0 fL   MCH 31.6 26.0 - 34.0 pg   MCHC 34.0 30.0 - 36.0 g/dL   RDW 84.6 96.2 - 95.2 %   Platelets 340 150 - 400 K/uL   nRBC 0.0 0.0 - 0.2 %    Comment: Performed at [facility], [address], [place],  [place] [address]  PTH, intact and calcium     Status: Abnormal   Collection Time: 03/01/23 12:18 PM  Result Value Ref Range   Calcium 10.6 (H) 8.7 - 10.3 mg/dL   PTH 63 15 - 65 pg/mL   PTH Interp Comment     Comment: Interpretation                 Intact PTH    Calcium                                 (pg/mL)      (mg/dL) Normal                          15 - 65     8.6 - 10.2 Primary Hyperparathyroidism         >65          >10.2 Secondary Hyperparathyroidism       >65          <10.2 Non-Parathyroid Hypercalcemia       <65          >  10.2 Hypoparathyroidism                  <15          < 8.6 Non-Parathyroid Hypocalcemia    15 - 65          < 8.6   Cytology - Non PAP; RIGHT THYROID LOBE     Status: None   Collection Time: 03/05/23  3:47 PM  Result Value Ref Range   CYTOLOGY - NON GYN      CYTOLOGY - NON PAP CASE: [id number] PATIENT: Cheryl Rowe Non-Gynecological Cytology Report     Clinical History: Nodule #1: Right; Mid, Maximum size: 3.2 cm; Other 2 dimensions: 1.7 x 2.8 cm, previously 3.0 x 2.0 x 2.5 cm, solid/almost completely solid, isoechoic, TI-RADS total points: 3. Specimen Submitted:  A. THYROID, RT MID, FINE NEEDLE ASPIRATION   FINAL MICROSCOPIC DIAGNOSIS: - Benign follicular nodule (Bethesda category II)  SPECIMEN ADEQUACY: Satisfactory for evaluation  GROSS: Received is/are 30 cc's of peach cytolyt solution. (EM[facility]:emh) Prepared: Smears:  0 Concentration Method (Thin Prep):  1 Cell Block:  1 conventional. Additional Studies: Afirma collected.     Final Diagnosis performed by Lance Coon, MD.   Electronically signed 03/08/2023 Technical and / or Professional components performed at [facility]. [facility], [address] [place], [place] [address].  Immunohistochemistry Technical component (if applicable) was performed  at [facility]. [address], [place], [place] [address].   IMMUNOHISTOCHEMISTRY DISCLAIMER (if  applicable): Some of these immunohistochemical stains may have been developed and the performance characteristics determine by [facility]. Some may not have been cleared or approved by the U.S. Food and Drug Administration. The FDA has determined that such clearance or approval is not necessary. This test is used for clinical purposes. It should not be regarded as investigational or for research. This laboratory is certified under the Clinical Laboratory Improvement Amendments of 1988 (CLIA-88) as qualified to perform high complexity clinical laboratory testing.  The controls stained appropriately.   IHC stains are performed on formalin fixed, paraffin embedded tissue using a 3,3"diaminobenzidine (DAB) chromogen and Leica Bond Autostainer System. The staining intensity of the nucleus is score manually and is reported as the percenta ge of tumor cell nuclei demonstrating specific nuclear staining. The specimens are fixed in 10% Neutral Formalin for at least 6 hours and up to 72hrs. These tests are validated on decalcified tissue. Results should be interpreted with caution given the possibility of false negative results on decalcified specimens. Antibody Clones are as follows ER-clone [id number], PR-clone 16, Ki67- clone MM1. Some of these immunohistochemical stains may have been developed and the performance characteristics determined by [facility].   Calcium, urine, 24 hour     Status: None (Preliminary result)   Collection Time: 03/09/23  9:00 AM  Result Value Ref Range   Calcium, Urine WILL FOLLOW    Calcium, 24H Urine WILL FOLLOW   Creatinine, urine, 24 hour     Status: None   Collection Time: 03/09/23  9:00 AM  Result Value Ref Range   Creatinine, Urine 88.2 Not Estab. mg/dL   Creatinine, 09W Ur 119 800 - 1,800 mg/24 hr   Parathyroid scan on 11/08/2022 IMPRESSION: Retained focal radiotracer activity posterior to the mid RIGHT thyroid gland is most consistent  with parathyroid adenoma.  Assessment: 1. 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  Osteoporosis 3.  Multinodular goiter-status post fine-needle aspiration in 2017 and 2024 4.  Vitamin D  deficiency 5. Hyperlipidemia  Plan: -I reviewed and discussed her most recent labs and imaging findings with her.  She has confirmed diagnosis of primary hyperparathyroidism with hypercalcemia and osteoporosis.  She has consulted with Dr. Gerrit Friends and being prepared for surgery at the end of July 2024.    Her most recent labs show calcium stable between 10.6-11.2 with PTH of 63-80.  Her most recent 24-hour urine calcium is still in process, previously denied minimal calcium in the urine after collecting inadequate sample.    She  has osteoporosis.  Her parathyroid sestamibi scan showed right lower parathyroid adenoma.  -She has primary hyperparathyroidism still suitable for surgical consideration.  She is advised to proceed as planned.    -She has osteoporosis of the hips and osteopenia of the spine.  She is open to resume treatment either with resumption of Reclast or Prolia.  This will be postponed until after her surgery.     Her previous IV Reclast treatments were usually  conducted between October-November of each year.  Up to 6 years (62 more treatments )   is planned for her.   -She is now vitamin D replete, at this time, advised to continue maintenance dose , vitamin D3 2000 units daily.  No history of  nephrolithiasis, fragility fractures. No abdominal pain, no major mood disorders, no bone pain.  Her next DXA is in July 2025. -Regarding her multinodular goiter :  - she is status post fine-needle aspiration of the right-sided thyroid lobe nodule in September 2017 with benign follicular adenoma , and again in 2024. She does not require surgical intervention at this time.  Her thyroid function tests are within normal limits.    Regarding her dyslipidemia with LDL of 133: reports  statin intolerance . She is advised on WFPB diet.  She is advised to maintain close follow-up with her PMD.   I spent  26  minutes in the care of the patient today including review of labs from Thyroid Function, CMP, and other relevant labs ; imaging/biopsy records (current and previous including abstractions from other facilities); face-to-face time discussing  her lab results and symptoms, medications doses, her options of short and long term treatment based on the latest standards of care / guidelines;   and documenting the encounter.  [name]  participated in the discussions, expressed understanding, and voiced agreement with the above plans.  All questions were answered to her satisfaction. she is encouraged to contact clinic should she have any questions or concerns prior to her return visit.   - Return in about 5 weeks (around 04/16/2023) for F/U with Pre-visit Labs.   Marquis Lunch, MD [facility] [address] [place], [place] [address] Phone: [phone]  Fax: [phone]    This note was partially dictated with voice recognition software. Similar sounding words can be transcribed inadequately or may not  be corrected upon review.  03/12/2023, 2:08 PM

## 2023-03-14 ENCOUNTER — Other Ambulatory Visit: Payer: Self-pay | Admitting: Family Medicine

## 2023-03-16 ENCOUNTER — Ambulatory Visit: Payer: Self-pay | Admitting: Surgery

## 2023-03-16 NOTE — H&P (Signed)
REFERRING PHYSICIAN: Purcell Nails, MD  PROVIDER: Savanna Dooley Myra Rude, MD   Chief Complaint: New Consultation ( Hyperparathyroidism)  History of Present Illness:  Patient is referred by Dr. Purcell Nails for surgical evaluation and management of suspected primary hyperparathyroidism. Patient has been noted to have hypercalcemia for at least 3 to 4 years. She has undergone repeated laboratory studies with calcium levels ranging as high as 11.2. Intact PTH levels have been as high as 87. 25-hydroxy vitamin D was low normal at 33. Patient notes moderate fatigue. She denies nephrolithiasis. She does have osteoporosis and has been receiving treatment with Reclast. Patient does have a history of thyroid nodule and underwent ultrasound and fine-needle aspiration biopsy in 2017 in [place]. Reportedly these results were benign. Patient has had no prior head or neck surgery. There is a family history of thyroid disease but no family history of parathyroid disease. Patient is from [place], [place], and presents today accompanied by her mother.  Review of Systems: A complete review of systems was obtained from the patient. I have reviewed this information and discussed as appropriate with the patient. See HPI as well for other ROS.  Review of Systems  Constitutional: Positive for malaise/fatigue.  HENT: Negative.  Eyes: Negative.  Respiratory: Negative.  Cardiovascular: Negative.  Gastrointestinal: Negative.  Genitourinary: Negative.  Musculoskeletal: Positive for joint pain.  Skin: Negative.  Neurological: Negative.  Endo/Heme/Allergies: Negative.  Psychiatric/Behavioral: Negative.    Medical History: Past Medical History:  Diagnosis Date  Arthritis  GERD (gastroesophageal reflux disease)  Hypertension   Patient Active Problem List  Diagnosis  Hyperparathyroidism (CMS-HCC)   Past Surgical History:  Procedure Laterality Date  Back surgery  CHOLECYSTECTOMY     Allergies  Allergen Reactions  Cymbalta [Duloxetine] Other (See Comments)  AGGRESSION  Pravastatin Muscle Pain  LEGS ACHE   Current Outpatient Medications on File Prior to Visit  Medication Sig Dispense Refill  amLODIPine (NORVASC) 5 MG tablet Take 5 mg by mouth once daily  flaxseed oiL 1,000 mg Cap Take by mouth  multivitamin with minerals tablet Take 1 tablet by mouth once daily  oxyCODONE-acetaminophen (PERCOCET) 10-325 mg tablet TAKE ONE TABLET BY MOUTH EVERY 4 HOURS AS NEEDED FOR PAIN (MAX 5 TABLETS PER DAY)  pantoprazole (PROTONIX) 40 MG DR tablet Take 40 mg by mouth once daily  valsartan-hydroCHLOROthiazide (DIOVAN-HCT) 160-12.5 mg tablet Take 1 tablet by mouth once daily  venlafaxine (EFFEXOR-XR) 75 MG XR capsule Take 225 mg by mouth once daily  zoledronic acid (RECLAST) 5 mg/100 mL injection Inject into the vein   No current facility-administered medications on file prior to visit.   Family History  Problem Relation Age of Onset  High blood pressure (Hypertension) Father  Hyperlipidemia (Elevated cholesterol) Father    Social History   Tobacco Use  Smoking Status Never  Smokeless Tobacco Never    Social History   Socioeconomic History  Marital status: Widowed  Tobacco Use  Smoking status: Never  Smokeless tobacco: Never  Vaping Use  Vaping Use: Never used  Substance and Sexual Activity  Alcohol use: Not Currently  Drug use: Never   Objective:   Vitals:  BP: (!) 150/80  Pulse: 78  Temp: 36.8 C (98.2 F)  SpO2: 97%  Weight: 71.8 kg (158 lb 3.2 oz)  Height: 162.6 cm (5\' 4" )  PainSc: 0-No pain   Body mass index is 27.15 kg/m.  Physical Exam   GENERAL APPEARANCE Comfortable, no acute issues Development: normal Gross deformities: none  SKIN  Rash, lesions, ulcers: none Induration, erythema: none Nodules: none palpable  EYES Conjunctiva and lids: normal Pupils: equal and reactive  EARS, NOSE, MOUTH, THROAT External ears: no lesion  or deformity External nose: no lesion or deformity Hearing: grossly normal  NECK Symmetric: no Trachea: midline Thyroid: There is a dominant smooth relatively firm nodule occupying a large part of the right thyroid lobe, mobile with swallowing, nontender. Left thyroid lobe is without palpable abnormality.  CHEST Respiratory effort: normal Retraction or accessory muscle use: no Breath sounds: normal bilaterally Rales, rhonchi, wheeze: none  CARDIOVASCULAR Auscultation: regular rhythm, normal rate Murmurs: none Pulses: radial pulse 2+ palpable Lower extremity edema: none  ABDOMEN Not assessed  GENITOURINARY/RECTAL Not assessed  MUSCULOSKELETAL Station and gait: normal Digits and nails: no clubbing or cyanosis Muscle strength: grossly normal all extremities Range of motion: grossly normal all extremities Deformity: none  LYMPHATIC Cervical: none palpable Supraclavicular: none palpable  PSYCHIATRIC Oriented to person, place, and time: yes Mood and affect: normal for situation Judgment and insight: appropriate for situation  Assessment and Plan:  Hyperparathyroidism (CMS-HCC)  Patient is referred by her endocrinologist, Dr. Purcell Nails, for surgical evaluation and management of suspected primary hyperparathyroidism.  Patient provided with a copy of "Parathyroid Surgery: Treatment for Your Parathyroid Gland Problem", published by [employer], 12 pages. Book reviewed and explained to patient during visit today.  Today we reviewed her clinical history as well as her recent laboratory studies. I provided her with written literature on parathyroid surgery to review at home. We discussed further evaluation with imaging studies to include an ultrasound and a nuclear medicine parathyroid scan with sestamibi. The studies localize the adenoma and confirm the diagnosis approximately 80% of the time. If so, then she will be a good candidate for minimally invasive outpatient  surgery for parathyroidectomy. We discussed the procedure today. If the studies failed to identify an adenoma, then we will obtain a 4D CT scan of the neck in hopes of confirming the diagnosis and identifying the location of the parathyroid adenoma. The patient understands and agrees to proceed with the studies.  Patient will undergo the above imaging studies. We will contact her with the results and make a decision about how to proceed once these are reviewed.  Darnell Level, MD [facility] practice Office: [phone]

## 2023-03-16 NOTE — Progress Notes (Signed)
 Sent message, via epic in basket, requesting orders in epic from surgeon.  

## 2023-03-16 NOTE — H&P (View-Only) (Signed)
REFERRING PHYSICIAN: Purcell Nails, MD  PROVIDER: TODD Myra Rude, MD   Chief Complaint: New Consultation ( Hyperparathyroidism)  History of Present Illness:  Patient is referred by Dr. Purcell Nails for surgical evaluation and management of suspected primary hyperparathyroidism. Patient has been noted to have hypercalcemia for at least 3 to 4 years. She has undergone repeated laboratory studies with calcium levels ranging as high as 11.2. Intact PTH levels have been as high as 87. 25-hydroxy vitamin D was low normal at 33. Patient notes moderate fatigue. She denies nephrolithiasis. She does have osteoporosis and has been receiving treatment with Reclast. Patient does have a history of thyroid nodule and underwent ultrasound and fine-needle aspiration biopsy in 2017 in [place]. Reportedly these results were benign. Patient has had no prior head or neck surgery. There is a family history of thyroid disease but no family history of parathyroid disease. Patient is from [place], [place], and presents today accompanied by her mother.  Review of Systems: A complete review of systems was obtained from the patient. I have reviewed this information and discussed as appropriate with the patient. See HPI as well for other ROS.  Review of Systems  Constitutional: Positive for malaise/fatigue.  HENT: Negative.  Eyes: Negative.  Respiratory: Negative.  Cardiovascular: Negative.  Gastrointestinal: Negative.  Genitourinary: Negative.  Musculoskeletal: Positive for joint pain.  Skin: Negative.  Neurological: Negative.  Endo/Heme/Allergies: Negative.  Psychiatric/Behavioral: Negative.    Medical History: Past Medical History:  Diagnosis Date  Arthritis  GERD (gastroesophageal reflux disease)  Hypertension   Patient Active Problem List  Diagnosis  Hyperparathyroidism (CMS-HCC)   Past Surgical History:  Procedure Laterality Date  Back surgery  CHOLECYSTECTOMY     Allergies  Allergen Reactions  Cymbalta [Duloxetine] Other (See Comments)  AGGRESSION  Pravastatin Muscle Pain  LEGS ACHE   Current Outpatient Medications on File Prior to Visit  Medication Sig Dispense Refill  amLODIPine (NORVASC) 5 MG tablet Take 5 mg by mouth once daily  flaxseed oiL 1,000 mg Cap Take by mouth  multivitamin with minerals tablet Take 1 tablet by mouth once daily  oxyCODONE-acetaminophen (PERCOCET) 10-325 mg tablet TAKE ONE TABLET BY MOUTH EVERY 4 HOURS AS NEEDED FOR PAIN (MAX 5 TABLETS PER DAY)  pantoprazole (PROTONIX) 40 MG DR tablet Take 40 mg by mouth once daily  valsartan-hydroCHLOROthiazide (DIOVAN-HCT) 160-12.5 mg tablet Take 1 tablet by mouth once daily  venlafaxine (EFFEXOR-XR) 75 MG XR capsule Take 225 mg by mouth once daily  zoledronic acid (RECLAST) 5 mg/100 mL injection Inject into the vein   No current facility-administered medications on file prior to visit.   Family History  Problem Relation Age of Onset  High blood pressure (Hypertension) Father  Hyperlipidemia (Elevated cholesterol) Father    Social History   Tobacco Use  Smoking Status Never  Smokeless Tobacco Never    Social History   Socioeconomic History  Marital status: Widowed  Tobacco Use  Smoking status: Never  Smokeless tobacco: Never  Vaping Use  Vaping Use: Never used  Substance and Sexual Activity  Alcohol use: Not Currently  Drug use: Never   Objective:   Vitals:  BP: (!) 150/80  Pulse: 78  Temp: 36.8 C (98.2 F)  SpO2: 97%  Weight: 71.8 kg (158 lb 3.2 oz)  Height: 162.6 cm (5\' 4" )  PainSc: 0-No pain   Body mass index is 27.15 kg/m.  Physical Exam   GENERAL APPEARANCE Comfortable, no acute issues Development: normal Gross deformities: none  SKIN  Rash, lesions, ulcers: none Induration, erythema: none Nodules: none palpable  EYES Conjunctiva and lids: normal Pupils: equal and reactive  EARS, NOSE, MOUTH, THROAT External ears: no lesion  or deformity External nose: no lesion or deformity Hearing: grossly normal  NECK Symmetric: no Trachea: midline Thyroid: There is a dominant smooth relatively firm nodule occupying a large part of the right thyroid lobe, mobile with swallowing, nontender. Left thyroid lobe is without palpable abnormality.  CHEST Respiratory effort: normal Retraction or accessory muscle use: no Breath sounds: normal bilaterally Rales, rhonchi, wheeze: none  CARDIOVASCULAR Auscultation: regular rhythm, normal rate Murmurs: none Pulses: radial pulse 2+ palpable Lower extremity edema: none  ABDOMEN Not assessed  GENITOURINARY/RECTAL Not assessed  MUSCULOSKELETAL Station and gait: normal Digits and nails: no clubbing or cyanosis Muscle strength: grossly normal all extremities Range of motion: grossly normal all extremities Deformity: none  LYMPHATIC Cervical: none palpable Supraclavicular: none palpable  PSYCHIATRIC Oriented to person, place, and time: yes Mood and affect: normal for situation Judgment and insight: appropriate for situation  Assessment and Plan:  Hyperparathyroidism (CMS-HCC)  Patient is referred by her endocrinologist, Dr. Purcell Nails, for surgical evaluation and management of suspected primary hyperparathyroidism.  Patient provided with a copy of "Parathyroid Surgery: Treatment for Your Parathyroid Gland Problem", published by [employer], 12 pages. Book reviewed and explained to patient during visit today.  Today we reviewed her clinical history as well as her recent laboratory studies. I provided her with written literature on parathyroid surgery to review at home. We discussed further evaluation with imaging studies to include an ultrasound and a nuclear medicine parathyroid scan with sestamibi. The studies localize the adenoma and confirm the diagnosis approximately 80% of the time. If so, then she will be a good candidate for minimally invasive outpatient  surgery for parathyroidectomy. We discussed the procedure today. If the studies failed to identify an adenoma, then we will obtain a 4D CT scan of the neck in hopes of confirming the diagnosis and identifying the location of the parathyroid adenoma. The patient understands and agrees to proceed with the studies.  Patient will undergo the above imaging studies. We will contact her with the results and make a decision about how to proceed once these are reviewed.  Darnell Level, MD [facility] practice Office: [phone]

## 2023-03-17 NOTE — Progress Notes (Addendum)
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    Anesthesia review: heart murmur, HTN  Patient denies shortness of breath, fever, cough and chest pain at PAT appointment  Patient verbalized understanding of instructions that were given to them at the PAT appointment. Patient was also instructed that they will need to review over the PAT instructions again at home before surgery.

## 2023-03-17 NOTE — Patient Instructions (Addendum)
SURGICAL WAITING ROOM VISITATION  Patients having surgery or a procedure may have no more than 2 support people in the waiting area - these visitors may rotate.    Children under the age of 75 must have an adult with them who is not the patient.  Due to an increase in RSV and influenza rates and associated hospitalizations, children ages 45 and under may not visit patients in [facility] hospitals.  If the patient needs to stay at the hospital during part of their recovery, the visitor guidelines for inpatient rooms apply. Pre-op nurse will coordinate an appropriate time for 1 support person to accompany patient in pre-op.  This support person may not rotate.    Please refer to the [facility] website for the visitor guidelines for Inpatients (after your surgery is over and you are in a regular room).    Your procedure is scheduled on: 03/22/23   Report to [facility] Main Entrance    Report to admitting at 12:15 PM   Call this number if you have problems the morning of surgery [phone]   Do not eat food :After Midnight.   After Midnight you may have the following liquids until 11:30 AM DAY OF SURGERY  Water Non-Citrus Juices (without pulp, NO RED-Apple, White grape, White cranberry) Black Coffee (NO MILK/CREAM OR CREAMERS, sugar ok)  Clear Tea (NO MILK/CREAM OR CREAMERS, sugar ok) regular and decaf                             Plain Jell-O (NO RED)                                           Fruit ices (not with fruit pulp, NO RED)                                     Popsicles (NO RED)                                                               Sports drinks like Gatorade (NO RED)                      If you have questions, please contact your surgeon's office.   FOLLOW BOWEL PREP AND ANY ADDITIONAL PRE OP INSTRUCTIONS YOU RECEIVED FROM YOUR SURGEON'S OFFICE!!!     Oral Hygiene is also important to reduce your risk of infection.                                     Remember - BRUSH YOUR TEETH THE MORNING OF SURGERY WITH YOUR REGULAR TOOTHPASTE  DENTURES WILL BE REMOVED PRIOR TO SURGERY PLEASE DO NOT APPLY "Poly grip" OR ADHESIVES!!!   Take these medicines the morning of surgery with A SIP OF WATER: Amlodipine, Zofran, Percocet, Pantoprazole, Rosuvastatin, Venlafaxine  You may not have any metal on your body including hair pins, jewelry, and body piercing             Do not wear make-up, lotions, powders, perfumes, or deodorant  Do not wear nail polish including gel and S&S, artificial/acrylic nails, or any other type of covering on natural nails including finger and toenails. If you have artificial nails, gel coating, etc. that needs to be removed by a nail salon please have this removed prior to surgery or surgery may need to be canceled/ delayed if the surgeon/ anesthesia feels like they are unable to be safely monitored.   Do not shave  48 hours prior to surgery.    Do not bring valuables to the hospital. [place] IS NOT             RESPONSIBLE   FOR VALUABLES.   Contacts, glasses, dentures or bridgework may not be worn into surgery.  DO NOT BRING YOUR HOME MEDICATIONS TO THE HOSPITAL. PHARMACY WILL DISPENSE MEDICATIONS LISTED ON YOUR MEDICATION LIST TO YOU DURING YOUR ADMISSION IN THE HOSPITAL!    Patients discharged on the day of surgery will not be allowed to drive home.  Someone NEEDS to stay with you for the first 24 hours after anesthesia.              Please read over the following fact sheets you were given: IF YOU HAVE QUESTIONS ABOUT YOUR PRE-OP INSTRUCTIONS PLEASE CALL [phone] Contras    If you received a COVID test during your pre-op visit  it is requested that you wear a mask when out in public, stay away from anyone that may not be feeling well and notify your surgeon if you develop symptoms. If you test positive for Covid or have been in contact with anyone that has tested positive in the last  10 days please notify you surgeon.    [place] - Preparing for Surgery Before surgery, you can play an important role.  Because skin is not sterile, your skin needs to be as free of germs as possible.  You can reduce the number of germs on your skin by washing with CHG (chlorahexidine gluconate) soap before surgery.  CHG is an antiseptic cleaner which kills germs and bonds with the skin to continue killing germs even after washing. Please DO NOT use if you have an allergy to CHG or antibacterial soaps.  If your skin becomes reddened/irritated stop using the CHG and inform your nurse when you arrive at Short Stay. Do not shave (including legs and underarms) for at least 48 hours prior to the first CHG shower.  You may shave your face/neck.  Please follow these instructions carefully:  1.  Shower with CHG Soap the night before surgery and the  morning of surgery.  2.  If you choose to wash your hair, wash your hair first as usual with your normal  shampoo.  3.  After you shampoo, rinse your hair and body thoroughly to remove the shampoo.                             4.  Use CHG as you would any other liquid soap.  You can apply chg directly to the skin and wash.  Gently with a scrungie or clean washcloth.  5.  Apply the CHG Soap to your body ONLY FROM THE NECK DOWN.   Do   not use on  face/ open                           Wound or open sores. Avoid contact with eyes, ears mouth and   genitals (private parts).                       Wash face,  Genitals (private parts) with your normal soap.             6.  Wash thoroughly, paying special attention to the area where your    surgery  will be performed.  7.  Thoroughly rinse your body with warm water from the neck down.  8.  DO NOT shower/wash with your normal soap after using and rinsing off the CHG Soap.                9.  Pat yourself dry with a clean towel.            10.  Wear clean pajamas.            11.  Place clean sheets on your bed the night  of your first shower and do not  sleep with pets. Day of Surgery : Do not apply any lotions/deodorants the morning of surgery.  Please wear clean clothes to the hospital/surgery center.  FAILURE TO FOLLOW THESE INSTRUCTIONS MAY RESULT IN THE CANCELLATION OF YOUR SURGERY  PATIENT SIGNATURE_________________________________  NURSE SIGNATURE__________________________________  ________________________________________________________________________

## 2023-03-18 ENCOUNTER — Encounter (HOSPITAL_COMMUNITY)
Admission: RE | Admit: 2023-03-18 | Discharge: 2023-03-18 | Disposition: A | Discharge status: Home / Self Care | Payer: Medicare HMO | Source: Ambulatory Visit | Attending: Surgery | Admitting: Surgery

## 2023-03-18 ENCOUNTER — Other Ambulatory Visit: Payer: Self-pay

## 2023-03-18 ENCOUNTER — Encounter (HOSPITAL_COMMUNITY): Payer: Self-pay

## 2023-03-18 ENCOUNTER — Encounter (HOSPITAL_COMMUNITY): Payer: Self-pay | Admitting: Surgery

## 2023-03-18 VITALS — BP 132/84 | HR 79 | Temp 98.4°F | Resp 18 | Ht 64.0 in | Wt 158.8 lb

## 2023-03-18 DIAGNOSIS — Z01812 Encounter for preprocedural laboratory examination: Secondary | ICD-10-CM | POA: Insufficient documentation

## 2023-03-18 DIAGNOSIS — I10 Essential (primary) hypertension: Secondary | ICD-10-CM | POA: Diagnosis not present

## 2023-03-18 DIAGNOSIS — E210 Primary hyperparathyroidism: Secondary | ICD-10-CM | POA: Insufficient documentation

## 2023-03-18 DIAGNOSIS — Z79899 Other long term (current) drug therapy: Secondary | ICD-10-CM | POA: Diagnosis not present

## 2023-03-18 DIAGNOSIS — I700 Atherosclerosis of aorta: Secondary | ICD-10-CM | POA: Insufficient documentation

## 2023-03-18 DIAGNOSIS — K449 Diaphragmatic hernia without obstruction or gangrene: Secondary | ICD-10-CM | POA: Diagnosis not present

## 2023-03-18 DIAGNOSIS — E785 Hyperlipidemia, unspecified: Secondary | ICD-10-CM | POA: Insufficient documentation

## 2023-03-18 DIAGNOSIS — K219 Gastro-esophageal reflux disease without esophagitis: Secondary | ICD-10-CM | POA: Diagnosis not present

## 2023-03-18 HISTORY — DX: Cardiac murmur, unspecified: R01.1

## 2023-03-18 LAB — BASIC METABOLIC PANEL
Anion gap: 9 (ref 5–15)
BUN: 25 mg/dL — ABNORMAL HIGH (ref 6–20)
CO2: 26 mmol/L (ref 22–32)
Calcium: 10.3 mg/dL (ref 8.9–10.3)
Chloride: 105 mmol/L (ref 98–111)
Creatinine, Ser: 0.86 mg/dL (ref 0.44–1.00)
GFR, Estimated: >60 mL/min (ref >60)
Glucose, Bld: 91 mg/dL (ref 70–99)
Potassium: 4.7 mmol/L (ref 3.5–5.1)
Sodium: 140 mmol/L (ref 135–145)

## 2023-03-18 LAB — CBC
HCT: 42.4 % (ref 36.0–46.0)
Hemoglobin: 13.9 g/dL (ref 12.0–15.0)
MCH: 31.8 pg (ref 26.0–34.0)
MCHC: 32.8 g/dL (ref 30.0–36.0)
MCV: 97 fL (ref 80.0–100.0)
Platelets: 336 10*3/uL (ref 150–400)
RBC: 4.37 MIL/uL (ref 3.87–5.11)
RDW: 12.5 % (ref 11.5–15.5)
WBC: 6.2 10*3/uL (ref 4.0–10.5)
nRBC: 0 % (ref 0.0–0.2)

## 2023-03-19 ENCOUNTER — Encounter (HOSPITAL_COMMUNITY): Payer: Self-pay

## 2023-03-19 NOTE — Anesthesia Preprocedure Evaluation (Signed)
Anesthesia Evaluation  Patient identified by MRN, date of birth, ID band Patient awake    Reviewed: Allergy & Precautions, H&P , NPO status , Patient's Chart, lab work & pertinent test results  Airway Mallampati: II  TM Distance: >3 FB Neck ROM: Full    Dental  (+) Teeth Intact, Dental Advisory Given   Pulmonary neg pulmonary ROS   Pulmonary exam normal breath sounds clear to auscultation       Cardiovascular hypertension (139/86 preop), Pt. on medications Normal cardiovascular exam Rhythm:Regular Rate:Normal  Echo 2023 w/u for murmur heard on exam  1. Left ventricular ejection fraction, by estimation, is 65 to 70%. The  left ventricle has normal function. The left ventricle has no regional  wall motion abnormalities. Left ventricular diastolic parameters were  normal.   2. Right ventricular systolic function is normal. The right ventricular  size is normal. There is normal pulmonary artery systolic pressure. The  estimated right ventricular systolic pressure is 24.0 mmHg.   3. The mitral valve is normal in structure. No evidence of mitral valve  regurgitation. No evidence of mitral stenosis.   4. The aortic valve is tricuspid. Aortic valve regurgitation is not  visualized. No aortic stenosis is present.   5. The inferior vena cava is normal in size with greater than 50%  respiratory variability, suggesting right atrial pressure of 3 mmHg.     Neuro/Psych  Headaches PSYCHIATRIC DISORDERS Anxiety Depression       GI/Hepatic Neg liver ROS, hiatal hernia,GERD  Controlled,,  Endo/Other  negative endocrine ROS    Renal/GU negative Renal ROS  negative genitourinary   Musculoskeletal  (+) Arthritis , Osteoarthritis,    Abdominal   Peds negative pediatric ROS (+)  Hematology negative hematology ROS (+)   Anesthesia Other Findings   Reproductive/Obstetrics negative OB ROS                               Anesthesia Physical Anesthesia Plan  ASA: 2  Anesthesia Plan: General   Post-op Pain Management: Tylenol PO (pre-op)*   Induction: Intravenous  PONV Risk Score and Plan: 3 and Ondansetron, Dexamethasone, Midazolam and Treatment may vary due to age or medical condition  Airway Management Planned: Oral ETT  Additional Equipment: None  Intra-op Plan:   Post-operative Plan: Extubation in OR  Informed Consent: I have reviewed the patients History and Physical, chart, labs and discussed the procedure including the risks, benefits and alternatives for the proposed anesthesia with the patient or authorized representative who has indicated his/her understanding and acceptance.     Dental advisory given  Plan Discussed with: CRNA  Anesthesia Plan Comments:          Anesthesia Quick Evaluation

## 2023-03-19 NOTE — Progress Notes (Signed)
Case: [id number] Date/Time: 03/22/23 1415   Procedure: NECK EXPLORATION WITH PARATHYROIDECTOMY   Anesthesia type: General   Pre-op diagnosis: PRIMARY HYPER PARATHYROIDISM   Location: [facility] ROOM 03 / [facility]   Surgeons: Darnell Level, MD       DISCUSSION: Cheryl Rowe is a 62 yo female who presents to PAT prior to surgery above. PMH significant for hyperparathyroidism, GERD, large hiatal hernia, HTN, aortic atherosclerosis, heart murmur, migraines, chronic back pain s/p lumbar fusion (L4-S1 in 2018), osteoporosis.  Patient is scheduled for parathyroidectomy due to hyperparathyroidism. She has also had recent episodes of upper abdominal pain, N/V leading to an ED visit on 02/07/23. CT abdomen/pelvis was obtained which showed a moderate-large hiatal hernia. She was advised to fu with General Surgery and it has been recommended that this is repaired due to her being symptomatic but her parathyroid surgery is taking precedence. She has been taking Protonix BID.  No prior anesthesia complications.  Patient follows with her PCP for chronic medical issues. BP has been controlled. Pt with aortic atherosclerosis and HLD and takes a statin. Has hx of heart murmur and had an echo in 2023 which showed normal EF and no significant valvular disease.   VS: BP 132/84   Pulse 79   Temp 36.9 C (Oral)   Resp 18   Ht 5\' 4"  (1.626 m)   Wt 72 kg   SpO2 97%   BMI 27.26 kg/m   PROVIDERS: Babs Sciara, MD   LABS: Labs reviewed: Acceptable for surgery. (all labs ordered are listed, but only abnormal results are displayed)  Labs Reviewed  BASIC METABOLIC PANEL - Abnormal; Notable for the following components:      Result Value   BUN 25 (*)    All other components within normal limits  CBC     IMAGES:  CT abdomen/pelvis 02/07/23:    IMPRESSION: 1. Mildly dilated right lower quadrant appendix with slight appendiceal wall thickening. No significant periappendiceal fat stranding. No abscess or free  air. Findings are equivocal for acute appendicitis. 2. Moderate to large hiatal hernia. 3.  Aortic Atherosclerosis (ICD10-I70.0).   EKG 02/08/23:  NSR, rate 76 Baseline wander in V3   CV:  Echo 02/19/22:  IMPRESSIONS     1. Left ventricular ejection fraction, by estimation, is 65 to 70%. The  left ventricle has normal function. The left ventricle has no regional  wall motion abnormalities. Left ventricular diastolic parameters were  normal.   2. Right ventricular systolic function is normal. The right ventricular  size is normal. There is normal pulmonary artery systolic pressure. The  estimated right ventricular systolic pressure is 24.0 mmHg.   3. The mitral valve is normal in structure. No evidence of mitral valve  regurgitation. No evidence of mitral stenosis.   4. The aortic valve is tricuspid. Aortic valve regurgitation is not  visualized. No aortic stenosis is present.   5. The inferior vena cava is normal in size with greater than 50%  respiratory variability, suggesting right atrial pressure of 3 mmHg.   Past Medical History:  Diagnosis Date   ADHD (attention deficit hyperactivity disorder)    Arthritis    Depression    Family history of adverse reaction to anesthesia    sister has PONV   GERD (gastroesophageal reflux disease)    Heart murmur    HTN (hypertension) 07/10/2021   Hyperlipidemia    Migraine    Scoliosis    Teenage   Spondylolisthesis of lumbosacral region  Wears glasses     Past Surgical History:  Procedure Laterality Date   BACK SURGERY     rods   BIOPSY THYROID  03/2023   CERVICAL ABLATION     CHOLECYSTECTOMY     COLONOSCOPY WITH PROPOFOL N/A 03/25/2018   Procedure: COLONOSCOPY WITH PROPOFOL;  Surgeon: Malissa Hippo, MD;  [facility];  Service: Endoscopy;  Laterality: N/A;  9:30   POLYPECTOMY  03/25/2018   Procedure: POLYPECTOMY;  Surgeon: Malissa Hippo, MD;  Location: [facility];  Service: Endoscopy;;  hepatic  flexure   TONSILLECTOMY      MEDICATIONS:  amLODipine (NORVASC) 10 MG tablet   aspirin-acetaminophen-caffeine (EXCEDRIN MIGRAINE) 250-250-65 MG tablet   diphenhydrAMINE (BENADRYL) 25 MG tablet   Flaxseed, Linseed, (FLAX SEED OIL) 1000 MG CAPS   Multiple Vitamin (MULTIVITAMIN WITH MINERALS) TABS tablet   ondansetron (ZOFRAN-ODT) 4 MG disintegrating tablet   oxyCODONE-acetaminophen (PERCOCET) 10-325 MG tablet   oxyCODONE-acetaminophen (PERCOCET) 10-325 MG tablet   oxyCODONE-acetaminophen (PERCOCET) 10-325 MG tablet   pantoprazole (PROTONIX) 40 MG tablet   Polyethyl Glycol-Propyl Glycol (LUBRICATING EYE DROPS OP)   rosuvastatin (CRESTOR) 10 MG tablet   valsartan-hydrochlorothiazide (DIOVAN-HCT) 160-12.5 MG tablet   venlafaxine XR (EFFEXOR-XR) 75 MG 24 hr capsule   Zoledronic Acid (RECLAST IV)   No current facility-administered medications for this encounter.   Marcille Blanco [facility]/Anesthesiology [facility] Phone [phone] 03/19/2023 9:13 AM

## 2023-03-22 ENCOUNTER — Ambulatory Visit (HOSPITAL_BASED_OUTPATIENT_CLINIC_OR_DEPARTMENT_OTHER): Payer: Medicare HMO | Admitting: Anesthesiology

## 2023-03-22 ENCOUNTER — Ambulatory Visit (HOSPITAL_COMMUNITY): Payer: Medicare HMO | Admitting: Medical

## 2023-03-22 ENCOUNTER — Encounter (HOSPITAL_COMMUNITY)
Admission: AD | Disposition: A | Discharge status: Home / Self Care | Payer: Self-pay | Source: Home / Self Care | Attending: Surgery

## 2023-03-22 ENCOUNTER — Other Ambulatory Visit: Payer: Self-pay

## 2023-03-22 ENCOUNTER — Ambulatory Visit (HOSPITAL_COMMUNITY)
Admission: AD | Admit: 2023-03-22 | Discharge: 2023-03-22 | Disposition: A | Discharge status: Home / Self Care | Payer: Medicare HMO | Source: Home / Self Care | Attending: Surgery | Admitting: Surgery

## 2023-03-22 ENCOUNTER — Encounter (HOSPITAL_COMMUNITY): Payer: Self-pay | Admitting: Surgery

## 2023-03-22 DIAGNOSIS — E210 Primary hyperparathyroidism: Secondary | ICD-10-CM | POA: Insufficient documentation

## 2023-03-22 DIAGNOSIS — M810 Age-related osteoporosis without current pathological fracture: Secondary | ICD-10-CM | POA: Diagnosis not present

## 2023-03-22 DIAGNOSIS — F418 Other specified anxiety disorders: Secondary | ICD-10-CM | POA: Insufficient documentation

## 2023-03-22 DIAGNOSIS — I10 Essential (primary) hypertension: Secondary | ICD-10-CM | POA: Insufficient documentation

## 2023-03-22 DIAGNOSIS — Z7983 Long term (current) use of bisphosphonates: Secondary | ICD-10-CM | POA: Diagnosis not present

## 2023-03-22 DIAGNOSIS — D351 Benign neoplasm of parathyroid gland: Secondary | ICD-10-CM | POA: Diagnosis not present

## 2023-03-22 HISTORY — PX: PARATHYROIDECTOMY: SHX19

## 2023-03-22 LAB — CALCIUM, URINE, 24 HOUR

## 2023-03-22 LAB — CREATININE, URINE, 24 HOUR
Creatinine, 24H Ur: 882 mg/24 hr (ref 800–1800)
Creatinine, Urine: 88.2 mg/dL

## 2023-03-22 SURGERY — PARATHYROIDECTOMY
Anesthesia: General

## 2023-03-22 MED ORDER — HYDROMORPHONE HCL 1 MG/ML IJ SOLN
INTRAMUSCULAR | Status: DC | PRN
  Administered 2023-03-22 (×2): .5 mg via INTRAVENOUS
  Administered 2023-03-22: 1 mg via INTRAVENOUS

## 2023-03-22 MED ORDER — MIDAZOLAM HCL 2 MG/2ML IJ SOLN
INTRAMUSCULAR | Status: AC
  Filled 2023-03-22: qty 2

## 2023-03-22 MED ORDER — HEMOSTATIC AGENTS (NO CHARGE) OPTIME
TOPICAL | Status: DC | PRN
  Administered 2023-03-22: 1 via TOPICAL

## 2023-03-22 MED ORDER — HYDROMORPHONE HCL 1 MG/ML IJ SOLN: 0.2500 mg | INTRAMUSCULAR | Status: DC | PRN

## 2023-03-22 MED ORDER — DEXAMETHASONE SODIUM PHOSPHATE 10 MG/ML IJ SOLN
INTRAMUSCULAR | Status: DC | PRN
  Administered 2023-03-22: 8 mg via INTRAVENOUS

## 2023-03-22 MED ORDER — LIDOCAINE HCL (CARDIAC) PF 100 MG/5ML IV SOSY
PREFILLED_SYRINGE | INTRAVENOUS | Status: DC | PRN
  Administered 2023-03-22: 60 mg via INTRAVENOUS

## 2023-03-22 MED ORDER — ONDANSETRON HCL 4 MG/2ML IJ SOLN: 4.0000 mg | Freq: Once | INTRAMUSCULAR | Status: DC | PRN

## 2023-03-22 MED ORDER — PROPOFOL 10 MG/ML IV BOLUS
INTRAVENOUS | Status: AC
  Filled 2023-03-22: qty 20

## 2023-03-22 MED ORDER — CEFAZOLIN SODIUM-DEXTROSE 2-4 GM/100ML-% IV SOLN
2.0000 g | INTRAVENOUS | Status: AC
  Administered 2023-03-22: 2 g via INTRAVENOUS
  Filled 2023-03-22: qty 100

## 2023-03-22 MED ORDER — CHLORHEXIDINE GLUCONATE CLOTH 2 % EX PADS: 6.0000 | MEDICATED_PAD | Freq: Once | CUTANEOUS | Status: DC

## 2023-03-22 MED ORDER — SUGAMMADEX SODIUM 200 MG/2ML IV SOLN
INTRAVENOUS | Status: DC | PRN
  Administered 2023-03-22: 200 mg via INTRAVENOUS

## 2023-03-22 MED ORDER — OXYCODONE HCL 5 MG PO TABS: 5.0000 mg | ORAL_TABLET | Freq: Once | ORAL | Status: DC | PRN

## 2023-03-22 MED ORDER — ONDANSETRON HCL 4 MG/2ML IJ SOLN
INTRAMUSCULAR | Status: AC
  Filled 2023-03-22: qty 2

## 2023-03-22 MED ORDER — CHLORHEXIDINE GLUCONATE 0.12 % MT SOLN
15.0000 mL | Freq: Once | OROMUCOSAL | Status: AC
  Administered 2023-03-22: 15 mL via OROMUCOSAL

## 2023-03-22 MED ORDER — FENTANYL CITRATE (PF) 250 MCG/5ML IJ SOLN
INTRAMUSCULAR | Status: AC
  Filled 2023-03-22: qty 5

## 2023-03-22 MED ORDER — LIDOCAINE HCL (PF) 2 % IJ SOLN
INTRAMUSCULAR | Status: AC
  Filled 2023-03-22: qty 5

## 2023-03-22 MED ORDER — DEXMEDETOMIDINE HCL IN NACL 80 MCG/20ML IV SOLN
INTRAVENOUS | Status: DC | PRN
  Administered 2023-03-22 (×2): 4 ug via INTRAVENOUS
  Administered 2023-03-22: 8 ug via INTRAVENOUS

## 2023-03-22 MED ORDER — DEXAMETHASONE SODIUM PHOSPHATE 10 MG/ML IJ SOLN
INTRAMUSCULAR | Status: AC
  Filled 2023-03-22: qty 1

## 2023-03-22 MED ORDER — ROCURONIUM BROMIDE 10 MG/ML (PF) SYRINGE
PREFILLED_SYRINGE | INTRAVENOUS | Status: AC
  Filled 2023-03-22: qty 10

## 2023-03-22 MED ORDER — LACTATED RINGERS IV SOLN: INTRAVENOUS | Status: DC

## 2023-03-22 MED ORDER — ORAL CARE MOUTH RINSE: 15.0000 mL | Freq: Once | OROMUCOSAL | Status: AC

## 2023-03-22 MED ORDER — OXYCODONE HCL 5 MG PO TABS: 5.0000 mg | ORAL_TABLET | Freq: Four times a day (QID) | ORAL | 0 refills | Status: DC | PRN

## 2023-03-22 MED ORDER — MIDAZOLAM HCL 5 MG/5ML IJ SOLN
INTRAMUSCULAR | Status: DC | PRN
  Administered 2023-03-22: 2 mg via INTRAVENOUS

## 2023-03-22 MED ORDER — 0.9 % SODIUM CHLORIDE (POUR BTL) OPTIME
TOPICAL | Status: DC | PRN
  Administered 2023-03-22: 1000 mL

## 2023-03-22 MED ORDER — FENTANYL CITRATE (PF) 100 MCG/2ML IJ SOLN
INTRAMUSCULAR | Status: DC | PRN
  Administered 2023-03-22: 25 ug via INTRAVENOUS
  Administered 2023-03-22: 100 ug via INTRAVENOUS
  Administered 2023-03-22 (×2): 50 ug via INTRAVENOUS
  Administered 2023-03-22: 25 ug via INTRAVENOUS

## 2023-03-22 MED ORDER — AMISULPRIDE (ANTIEMETIC) 5 MG/2ML IV SOLN: 10.0000 mg | Freq: Once | INTRAVENOUS | Status: DC | PRN

## 2023-03-22 MED ORDER — ROCURONIUM BROMIDE 100 MG/10ML IV SOLN
INTRAVENOUS | Status: DC | PRN
  Administered 2023-03-22: 20 mg via INTRAVENOUS
  Administered 2023-03-22: 50 mg via INTRAVENOUS

## 2023-03-22 MED ORDER — OXYCODONE HCL 5 MG/5ML PO SOLN: 5.0000 mg | Freq: Once | ORAL | Status: DC | PRN

## 2023-03-22 MED ORDER — BUPIVACAINE HCL (PF) 0.25 % IJ SOLN
INTRAMUSCULAR | Status: DC | PRN
  Administered 2023-03-22: 16 mL

## 2023-03-22 MED ORDER — PROPOFOL 10 MG/ML IV BOLUS
INTRAVENOUS | Status: DC | PRN
  Administered 2023-03-22: 150 mg via INTRAVENOUS
  Administered 2023-03-22: 20 mg via INTRAVENOUS
  Administered 2023-03-22: 30 mg via INTRAVENOUS

## 2023-03-22 MED ORDER — ONDANSETRON HCL 4 MG/2ML IJ SOLN
INTRAMUSCULAR | Status: DC | PRN
  Administered 2023-03-22: 4 mg via INTRAVENOUS

## 2023-03-22 SURGICAL SUPPLY — 51 items
ADH SKN CLS APL DERMABOND .7 (GAUZE/BANDAGES/DRESSINGS) ×1
APL PRP STRL LF DISP 70% ISPRP (MISCELLANEOUS) ×1
ATTRACTOMAT 16X20 MAGNETIC DRP (DRAPES) ×1 IMPLANT
BAG COUNTER SPONGE SURGICOUNT (BAG) ×2 IMPLANT
BAG SPNG CNTER NS LX DISP (BAG) ×1
BLADE HEX COATED 2.75 (ELECTRODE) ×2 IMPLANT
BLADE SURG 15 STRL LF DISP TIS (BLADE) ×2 IMPLANT
BLADE SURG 15 STRL SS (BLADE) ×1
CANISTER SUCT 3000ML PPV (MISCELLANEOUS) ×1 IMPLANT
CHLORAPREP W/TINT 26 (MISCELLANEOUS) ×1 IMPLANT
CLIP TI MEDIUM 6 (CLIP) ×2 IMPLANT
CLIP TI WIDE RED SMALL 6 (CLIP) ×2 IMPLANT
CNTNR URN SCR LID CUP LEK RST (MISCELLANEOUS) ×2 IMPLANT
CONT SPEC 4OZ STRL OR WHT (MISCELLANEOUS)
COVER SURGICAL LIGHT HANDLE (MISCELLANEOUS) ×2 IMPLANT
DERMABOND ADVANCED .7 DNX12 (GAUZE/BANDAGES/DRESSINGS) IMPLANT
DRAPE LAPAROTOMY T 98X78 PEDS (DRAPES) ×2 IMPLANT
DRAPE UTILITY XL STRL (DRAPES) ×2 IMPLANT
ELECT REM PT RETURN 15FT ADLT (MISCELLANEOUS) ×1 IMPLANT
GAUZE 4X4 16PLY ~~LOC~~+RFID DBL (SPONGE) ×2 IMPLANT
GAUZE SPONGE 2X2 8PLY STRL LF (GAUZE/BANDAGES/DRESSINGS) ×1 IMPLANT
GLOVE SURG ORTHO 8.0 STRL STRW (GLOVE) ×2 IMPLANT
GOWN STRL REUS W/ TWL LRG LVL3 (GOWN DISPOSABLE) ×2 IMPLANT
GOWN STRL REUS W/ TWL XL LVL3 (GOWN DISPOSABLE) ×1 IMPLANT
GOWN STRL REUS W/TWL LRG LVL3 (GOWN DISPOSABLE) ×1
GOWN STRL REUS W/TWL XL LVL3 (GOWN DISPOSABLE) ×1
HEMOSTAT ARISTA ABSORB 3G PWDR (HEMOSTASIS) IMPLANT
HEMOSTAT SURGICEL 2X4 FIBR (HEMOSTASIS) IMPLANT
ILLUMINATOR WAVEGUIDE N/F (MISCELLANEOUS) ×1 IMPLANT
KIT BASIN OR (CUSTOM PROCEDURE TRAY) ×2 IMPLANT
KIT TURNOVER KIT A (KITS) IMPLANT
NDL HYPO 25X1 1.5 SAFETY (NEEDLE) ×2 IMPLANT
NEEDLE HYPO 25X1 1.5 SAFETY (NEEDLE) ×1
NS IRRIG 1000ML POUR BTL (IV SOLUTION) ×2 IMPLANT
PACK BASIC VI WITH GOWN DISP (CUSTOM PROCEDURE TRAY) ×1 IMPLANT
PAD ARMBOARD 7.5X6 YLW CONV (MISCELLANEOUS) ×2 IMPLANT
PENCIL BUTTON HOLSTER BLD 10FT (ELECTRODE) ×2 IMPLANT
POSITIONER HEAD DONUT 9IN (MISCELLANEOUS) ×2 IMPLANT
SHEARS HARMONIC 9CM CVD (BLADE) ×2 IMPLANT
SPONGE INTESTINAL PEANUT (DISPOSABLE) IMPLANT
STRIP CLOSURE SKIN 1/2X4 (GAUZE/BANDAGES/DRESSINGS) ×2 IMPLANT
SUT MNCRL AB 4-0 PS2 18 (SUTURE) ×2 IMPLANT
SUT SILK 2 0 (SUTURE)
SUT SILK 2-0 18XBRD TIE 12 (SUTURE) IMPLANT
SUT SILK 3 0 (SUTURE)
SUT SILK 3-0 18XBRD TIE 12 (SUTURE) IMPLANT
SUT VIC AB 3-0 SH 18 (SUTURE) ×2 IMPLANT
SYR BULB EAR ULCER 3OZ GRN STR (SYRINGE) ×2 IMPLANT
SYR CONTROL 10ML LL (SYRINGE) ×2 IMPLANT
TOWEL GREEN STERILE FF (TOWEL DISPOSABLE) ×2 IMPLANT
TUBING CONNECTING 10 (TUBING) ×2 IMPLANT

## 2023-03-22 NOTE — Op Note (Signed)
Operative Note  Pre-operative Diagnosis:  primary hyperparathyroidism  Post-operative Diagnosis:  same  Surgeon:  Darnell Level, MD  Assistant:  none   Procedure:  Neck exploration with right superior parathyroidectomy  Anesthesia:  general  Estimated Blood Loss:  20 cc  Drains: none         Specimen: right superior parathyroid to pathology  Indications:  Patient is referred by Dr. Purcell Nails for surgical evaluation and management of suspected primary hyperparathyroidism. Patient has been noted to have hypercalcemia for at least 3 to 4 years. She has undergone repeated laboratory studies with calcium levels ranging as high as 11.2. Intact PTH levels have been as high as 87. 25-hydroxy vitamin D was low normal at 33. Patient notes moderate fatigue. She denies nephrolithiasis. She does have osteoporosis and has been receiving treatment with Reclast. Patient does have a history of thyroid nodule and underwent ultrasound and fine-needle aspiration biopsy in 2017 in [place].  Nuclear medicine parathyroid scan with sestamibi localized a right sided parathyroid adenoma.  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  Patient now comes to surgery for neck exploration and parathyroidectomy.  Procedure:  The patient was seen in the pre-op holding area. The risks, benefits, complications, treatment options, and expected outcomes were previously discussed with the patient. The patient agreed with the proposed plan and has signed the informed consent form.  The patient was brought to the operating room by the surgical team, identified as Cheryl Rowe and the procedure verified. A "time out" was completed and the above information confirmed.  Following administration of general anesthesia, the patient is positioned and then prepped and draped in the usual aseptic fashion.  After ascertaining that an adequate level of anesthesia been achieved, a Kocher incision is made with a #15  blade.  Dissection was carried through the subcutaneous tissues and platysma and hemostasis achieved with the electrocautery.  Skin flaps were elevated cephalad and caudad and a self-retaining retractor was placed.  Incision was made in the midline between the strap muscles.  Strap muscles were mobilized allowing for the exposure of the left thyroid lobe.  Left thyroid lobe was mobilized using the harmonic scalpel.  Exploration revealed what appeared to be a normal inferior parathyroid gland on the left and no evidence of an enlarged left superior parathyroid gland.  Dry pack was placed in the left neck.  Next the strap muscles were reflected to the right exposing a large anterior right thyroid nodule measuring approximately 3 to 4 cm in greatest dimension.  This had previously been evaluated with ultrasound and biopsied with benign cytopathology.  The right thyroid lobe was then mobilized using the harmonic scalpel.  Exploration inferiorly failed to identify any evidence of enlarged parathyroid tissue.  The inferior thyroid artery was identified.  Just cephalad to the artery and posterior to the thyroid lobe is an enlarged rounded parathyroid gland consistent with adenoma.  This was gently dissected out using the harmonic scalpel.  The vascular pedicle was divided between small ligaclips.  The gland was excised and submitted to pathology where frozen section confirmed hypercellular parathyroid tissue consistent with parathyroid adenoma.  Left side of the neck was again explored and again no evidence of enlarged parathyroid tissue was identified.  Neck was then irrigated with warm saline.  Good hemostasis was achieved throughout the operative field.  Fibrillar was placed throughout the operative field.  Strap muscles were reapproximated in the midline with interrupted 3-0 Vicryl sutures.  Platysma was closed with interrupted 3-0  Vicryl sutures.  Skin was anesthetized with local anesthetic.  Incision was  closed with a running 4-0 Monocryl subcuticular suture.  Wound was washed and dried and Dermabond was applied as dressing.  Patient was awakened from anesthesia and transported to the recovery room in stable condition.  The patient tolerated the procedure well.   Darnell Level, MD [facility] Surgery Office: [phone]

## 2023-03-22 NOTE — Discharge Instructions (Addendum)

## 2023-03-22 NOTE — Anesthesia Procedure Notes (Signed)
Procedure Name: Intubation Date/Time: 03/22/2023 12:16 PM  Performed by: Florene Route, CRNAPre-anesthesia Checklist: Patient identified, Emergency Drugs available, Suction available and Patient being monitored Patient Re-evaluated:Patient Re-evaluated prior to induction Oxygen Delivery Method: Circle system utilized Preoxygenation: Pre-oxygenation with 100% oxygen Induction Type: IV induction Ventilation: Mask ventilation without difficulty Laryngoscope Size: Miller and 2 Grade View: Grade I Tube type: Oral Tube size: 7.0 mm Number of attempts: 1 Airway Equipment and Method: Stylet Placement Confirmation: ETT inserted through vocal cords under direct vision, positive ETCO2 and breath sounds checked- equal and bilateral Secured at: 21 cm Tube secured with: Tape Dental Injury: Teeth and Oropharynx as per pre-operative assessment

## 2023-03-22 NOTE — Transfer of Care (Signed)
Immediate Anesthesia Transfer of Care Note  Patient: MICHON KACZMAREK  Procedure(s) Performed: NECK EXPLORATION WITH PARATHYROIDECTOMY  Patient Location: PACU  Anesthesia Type:General  Level of Consciousness: awake, alert , oriented, and patient cooperative  Airway & Oxygen Therapy: Patient Spontanous Breathing and Patient connected to face mask oxygen  Post-op Assessment: Report given to RN and Post -op Vital signs reviewed and stable  Post vital signs: Reviewed and stable  Last Vitals:  Vitals Value Taken Time  BP 141/85 03/22/23 1354  Temp    Pulse 107 03/22/23 1359  Resp 14 03/22/23 1359  SpO2 97 % 03/22/23 1359  Vitals shown include unfiled device data.  Last Pain:  Vitals:   03/22/23 1024  TempSrc:   PainSc: 0-No pain      Patients Stated Pain Goal: 4 (03/22/23 1024)  Complications: No notable events documented.

## 2023-03-22 NOTE — Anesthesia Postprocedure Evaluation (Signed)
Anesthesia Post Note  Patient: Cheryl Rowe  Procedure(s) Performed: NECK EXPLORATION WITH PARATHYROIDECTOMY     Patient location during evaluation: PACU Anesthesia Type: General Level of consciousness: awake and alert Pain management: pain level controlled Vital Signs Assessment: post-procedure vital signs reviewed and stable Respiratory status: spontaneous breathing, nonlabored ventilation and respiratory function stable Cardiovascular status: blood pressure returned to baseline and stable Postop Assessment: no apparent nausea or vomiting Anesthetic complications: no  No notable events documented.  Last Vitals:  Vitals:   03/22/23 1415 03/22/23 1430  BP: 130/75 114/76  Pulse: (!) 105 90  Resp: 18 13  Temp:  36.5 C  SpO2: 95% 92%    Last Pain:  Vitals:   03/22/23 1430  TempSrc:   PainSc: 1                  Jamy Whyte,W. EDMOND

## 2023-03-22 NOTE — Interval H&P Note (Signed)
History and Physical Interval Note:  03/22/2023 11:32 AM  Cheryl Rowe  has presented today for surgery, with the diagnosis of PRIMARY HYPER PARATHYROIDISM.  The various methods of treatment have been discussed with the patient and family. After consideration of risks, benefits and other options for treatment, the patient has consented to    Procedure(s): NECK EXPLORATION WITH PARATHYROIDECTOMY (N/A) as a surgical intervention.    The patient's history has been reviewed, patient examined, no change in status, stable for surgery.  I have reviewed the patient's chart and labs.  Questions were answered to the patient's satisfaction.    Darnell Level, MD [facility] Surgery A [facility] practice Office: [phone]   Darnell Level

## 2023-03-23 ENCOUNTER — Encounter (HOSPITAL_COMMUNITY): Payer: Self-pay | Admitting: Surgery

## 2023-03-23 LAB — SURGICAL PATHOLOGY

## 2023-03-24 ENCOUNTER — Other Ambulatory Visit (HOSPITAL_COMMUNITY): Payer: Medicare HMO

## 2023-03-24 ENCOUNTER — Ambulatory Visit: Payer: Medicare HMO | Admitting: Family Medicine

## 2023-03-26 ENCOUNTER — Ambulatory Visit (HOSPITAL_COMMUNITY): Admit: 2023-03-26 | Payer: Medicare HMO

## 2023-03-26 ENCOUNTER — Encounter (HOSPITAL_COMMUNITY): Payer: Self-pay

## 2023-03-26 SURGERY — ESOPHAGOGASTRODUODENOSCOPY (EGD) WITH PROPOFOL
Anesthesia: Monitor Anesthesia Care

## 2023-04-05 ENCOUNTER — Other Ambulatory Visit: Payer: Self-pay

## 2023-04-06 ENCOUNTER — Telehealth: Payer: Self-pay | Admitting: Pharmacy Technician

## 2023-04-06 NOTE — Telephone Encounter (Signed)
Auth Submission: NO AUTH NEEDED Site of care: Site of care: [facility] Payer: [employer] MEDICARE Medication & CPT/J Code(s) submitted: Reclast (Zolendronic acid) [id number] Route of submission (phone, fax, portal):  Phone # Fax # Auth type: Buy/Bill PB Units/visits requested: 1 DOSE Reference number:  Approval from: 04/06/23 to 07/07/23

## 2023-04-11 ENCOUNTER — Other Ambulatory Visit: Payer: Self-pay | Admitting: Family Medicine

## 2023-04-13 ENCOUNTER — Other Ambulatory Visit: Payer: Self-pay | Admitting: Emergency Medicine

## 2023-04-13 ENCOUNTER — Other Ambulatory Visit: Payer: Self-pay | Admitting: "Endocrinology

## 2023-04-13 ENCOUNTER — Telehealth: Payer: Self-pay | Admitting: "Endocrinology

## 2023-04-13 DIAGNOSIS — E210 Primary hyperparathyroidism: Secondary | ICD-10-CM

## 2023-04-13 DIAGNOSIS — I159 Secondary hypertension, unspecified: Secondary | ICD-10-CM

## 2023-04-13 NOTE — Telephone Encounter (Signed)
Pt will do labs this week but notified her that [facility] short stay would contact her as her infusion needs to be 6 weeks post surgery

## 2023-04-13 NOTE — Telephone Encounter (Signed)
Pt has her appt with you 8/19 and the reclast is scheduled for same day in the afternoon, she wants to know if she needs to do her labs before or after reclast and if she needs to reschedule our appt

## 2023-04-13 NOTE — Telephone Encounter (Signed)
Just to verify. Is pt ok to come in with labs results. Her reclast infusion is scheduled for the same day as her office visit here.

## 2023-04-14 DIAGNOSIS — E210 Primary hyperparathyroidism: Secondary | ICD-10-CM | POA: Diagnosis not present

## 2023-04-16 ENCOUNTER — Other Ambulatory Visit: Payer: Self-pay

## 2023-04-19 ENCOUNTER — Ambulatory Visit: Payer: Medicare HMO

## 2023-04-19 ENCOUNTER — Encounter: Payer: Self-pay | Admitting: "Endocrinology

## 2023-04-19 ENCOUNTER — Ambulatory Visit (INDEPENDENT_AMBULATORY_CARE_PROVIDER_SITE_OTHER): Payer: Medicare HMO | Admitting: "Endocrinology

## 2023-04-19 VITALS — BP 128/74 | HR 76 | Ht 64.0 in | Wt 161.4 lb

## 2023-04-19 DIAGNOSIS — E210 Primary hyperparathyroidism: Secondary | ICD-10-CM | POA: Diagnosis not present

## 2023-04-19 DIAGNOSIS — E782 Mixed hyperlipidemia: Secondary | ICD-10-CM | POA: Diagnosis not present

## 2023-04-19 DIAGNOSIS — M810 Age-related osteoporosis without current pathological fracture: Secondary | ICD-10-CM | POA: Diagnosis not present

## 2023-04-19 DIAGNOSIS — R7989 Other specified abnormal findings of blood chemistry: Secondary | ICD-10-CM | POA: Insufficient documentation

## 2023-04-19 NOTE — Progress Notes (Signed)
04/19/2023, 2:46 PM   Endocrinology follow-up note  Cheryl Rowe is a 62 y.o.-year-old female, referred by her PCP, Dr. Gerda Diss for evaluation for hypercalcemia/primary hyperparathyroidism.  She underwent right superior parathyroidectomy on March 22, 2023 with benign outcomes.  Her previsit labs confirm treatment effect with normocal the patient cemia and PTH in the normal range.  She also has osteoporosis on ongoing Reclast treatment. Past Medical History:  Diagnosis Date   ADHD (attention deficit hyperactivity disorder)    Arthritis    Depression    Family history of adverse reaction to anesthesia    sister has PONV   GERD (gastroesophageal reflux disease)    Heart murmur    HTN (hypertension) 07/10/2021   Hyperlipidemia    Migraine    Scoliosis    Teenage   Spondylolisthesis of lumbosacral region    Wears glasses     Past Surgical History:  Procedure Laterality Date   BACK SURGERY     rods   BIOPSY THYROID  03/2023   CERVICAL ABLATION     CHOLECYSTECTOMY     COLONOSCOPY WITH PROPOFOL N/A 03/25/2018   Procedure: COLONOSCOPY WITH PROPOFOL;  Surgeon: Malissa Hippo, MD;  [facility]: [facility];  Service: Endoscopy;  Laterality: N/A;  9:30   PARATHYROIDECTOMY N/A 03/22/2023   Procedure: NECK EXPLORATION WITH PARATHYROIDECTOMY;  Surgeon: Darnell Level, MD;  Location: [facility];  Service: General;  Laterality: N/A;   POLYPECTOMY  03/25/2018   Procedure: POLYPECTOMY;  Surgeon: Malissa Hippo, MD;  Location: [facility];  Service: Endoscopy;;  hepatic flexure   TONSILLECTOMY      Social History   Tobacco Use   Smoking status: Never   Smokeless tobacco: Never  Vaping Use   Vaping status: Never Used  Substance Use Topics   Alcohol use: No   Drug use: No    Outpatient Encounter Medications as of 04/19/2023  Medication Sig   amLODipine (NORVASC) 10 MG tablet Take 1 tablet (10 mg total) by mouth daily.    aspirin-acetaminophen-caffeine (EXCEDRIN MIGRAINE) 250-250-65 MG tablet Take 2 tablets by mouth daily as needed for headache.   diphenhydrAMINE (BENADRYL) 25 MG tablet Take 25 mg by mouth at bedtime as needed for sleep.   Flaxseed, Linseed, (FLAX SEED OIL) 1000 MG CAPS Take 1,000 mg by mouth daily.   Multiple Vitamin (MULTIVITAMIN WITH MINERALS) TABS tablet Take 1 tablet by mouth daily.   ondansetron (ZOFRAN-ODT) 4 MG disintegrating tablet Take 1 tablet (4 mg total) by mouth every 8 (eight) hours as needed for nausea.   oxyCODONE (OXY IR/ROXICODONE) 5 MG immediate release tablet Take 1 tablet (5 mg total) by mouth every 6 (six) hours as needed for moderate pain.   oxyCODONE-acetaminophen (PERCOCET) 10-325 MG tablet Take 1 tablet by mouth every 4 (four) hours as needed for pain. No more than 5 tablets a day   oxyCODONE-acetaminophen (PERCOCET) 10-325 MG tablet 1 every 4 hours as needed pain maximum 5/day to last 30 days (Patient not taking: Reported on 03/16/2023)   oxyCODONE-acetaminophen (PERCOCET) 10-325 MG tablet 1 every 4 hours as needed pain maximum 5/day to last 30 days (Patient not taking: Reported on 03/16/2023)   pantoprazole (  PROTONIX) 40 MG tablet Take 1 tablet (40 mg total) by mouth daily.   Polyethyl Glycol-Propyl Glycol (LUBRICATING EYE DROPS OP) Place 1 drop into both eyes daily as needed (dry eyes).   rosuvastatin (CRESTOR) 10 MG tablet One on M W and Friday   valsartan-hydrochlorothiazide (DIOVAN-HCT) 160-12.5 MG tablet TAKE 1 TABLET BY MOUTH EVERY DAY   venlafaxine XR (EFFEXOR-XR) 75 MG 24 hr capsule TAKE 3 CAPSULES BY MOUTH EVERY DAY   Zoledronic Acid (RECLAST IV) Inject 1 Dose into the vein once. yearly   No facility-administered encounter medications on file as of 04/19/2023.    Allergies  Allergen Reactions   Pravastatin     Legs ache   Cymbalta [Duloxetine Hcl] Other (See Comments)    Aggressive      HPI  Cheryl Rowe was diagnosed with mild primary  hyperparathyroidism with intermittent hypercalcemia associated with high PTH.   She has confirmed parathyroid adenoma .She underwent right superior parathyroidectomy on March 22, 2023 with benign outcomes.  Her previsit labs confirm treatment effect with normocalcemia and PTH in the normal range.    She does have osteoporosis as potential complication of primary hyperparathyroidism,  missed her Reclast infusion in 2023.  Her most recent Reclast infusion was in the process of her surgery.  Her previsit show normocalcemia, however it shows new and unexplained transaminitis. Patient is on Percocet pain management following her surgery.  A trial of oral Fosamax was not tolerable for her.   -Review of her last bone density in July 2023 showed femoral neck T score of -3.1.      No interval falls/fractures.    She is currently on vitamin D 2000 units daily supplement.  She is not on calcium supplement.   -  She has spondylolisthesis of lumbosacral spine .  No prior history of fragility fractures or falls. No history of  kidney stones.      She is 12 years postmenopausal.     -She has normal renal function. she is not on HCTZ or other thiazide therapy.  Patient takes multiple supplements of vitamins and elements including calcium/magnesium/zinc and vitamin D.  Did so for several years.   she does not have a family history of hypercalcemia, pituitary tumors, thyroid cancer, or osteoporosis.   I reviewed her chart and she also has a history of multinodular goiter status post biopsy of right-sided thyroid nodule.  FNA results are reviewed showing benign follicular nodule in September 2017.  She underwent repeat fine-needle aspiration of right thyroid nodule with benign outcomes.  She has severe dyslipidemia , currently on Crestor 10 mg p.o. nightly.     ROS: Limited as above.  PE: BP 128/74   Pulse 76   Ht 5\' 4"  (1.626 m)   Wt 161 lb 6.4 oz (73.2 kg)   BMI 27.70 kg/m  Wt Readings from Last 3  Encounters:  04/19/23 161 lb 6.4 oz (73.2 kg)  03/22/23 158 lb 12.8 oz (72 kg)  03/18/23 158 lb 12.8 oz (72 kg)    CMP     Component Value Date/Time   NA 141 04/14/2023 0842   K 4.1 04/14/2023 0842   CL 100 04/14/2023 0842   CO2 25 04/14/2023 0842   GLUCOSE 107 (H) 04/14/2023 0842   GLUCOSE 91 03/18/2023 1122   BUN 17 04/14/2023 0842   CREATININE 0.86 04/14/2023 0842   CALCIUM 10.2 04/14/2023 0842   PROT 7.5 04/14/2023 0842   ALBUMIN 5.1 (H) 04/14/2023 6160  AST 175 (H) 04/14/2023 0842   ALT 173 (H) 04/14/2023 0842   ALKPHOS 197 (H) 04/14/2023 0842   BILITOT 0.3 04/14/2023 0842   GFRNONAA >60 03/18/2023 1122   GFRAA 112 04/26/2020 1158     Lipid Panel ( most recent) Lipid Panel     Component Value Date/Time   CHOL 236 (H) 09/25/2022 0839   TRIG 97 09/25/2022 0839   HDL 86 09/25/2022 0839   CHOLHDL 2.7 09/25/2022 0839   CHOLHDL 4.0 03/15/2014 1259   VLDL 40 03/15/2014 1259   LDLCALC 133 (H) 09/25/2022 0839      Lab Results  Component Value Date   TSH 1.160 04/14/2023   TSH 1.320 09/25/2022   TSH 0.759 11/13/2021   TSH 1.790 12/06/2017   TSH 1.080 09/27/2017   TSH 0.249 (L) 08/27/2017   TSH 1.050 04/22/2016   FREET4 0.80 (L) 04/14/2023   FREET4 1.11 09/25/2022   FREET4 1.00 11/13/2021   FREET4 1.33 12/06/2017   FREET4 1.07 09/27/2017   FREET4 0.79 (L) 04/22/2016    Recent Results (from the past 2160 hour(s))  Lipase, blood     Status: None   Collection Time: 02/07/23  8:32 AM  Result Value Ref Range   Lipase 33 11 - 51 U/L    Comment: Performed at [facility], [address], [place], [place] [address]  Comprehensive metabolic panel     Status: Abnormal   Collection Time: 02/07/23  8:32 AM  Result Value Ref Range   Sodium 140 135 - 145 mmol/L   Potassium 3.6 3.5 - 5.1 mmol/L   Chloride 102 98 - 111 mmol/L   CO2 25 22 - 32 mmol/L   Glucose, Bld 157 (H) 70 - 99 mg/dL    Comment: Glucose reference range applies only to samples taken after fasting  for at least 8 hours.   BUN 19 6 - 20 mg/dL   Creatinine, Ser 6.04 0.44 - 1.00 mg/dL   Calcium 54.0 8.9 - 98.1 mg/dL   Total Protein 7.4 6.5 - 8.1 g/dL   Albumin 4.6 3.5 - 5.0 g/dL   AST 30 15 - 41 U/L   ALT 21 0 - 44 U/L   Alkaline Phosphatase 79 38 - 126 U/L   Total Bilirubin 0.6 0.3 - 1.2 mg/dL   GFR, Estimated >19 >14 mL/min    Comment: (NOTE) Calculated using the CKD-EPI Creatinine Equation (2021)    Anion gap 13 5 - 15    Comment: Performed at [facility], [address] [place], [place] [address]  CBC     Status: Abnormal   Collection Time: 02/07/23  8:32 AM  Result Value Ref Range   WBC 11.5 (H) 4.0 - 10.5 K/uL   RBC 3.95 3.87 - 5.11 MIL/uL   Hemoglobin 12.5 12.0 - 15.0 g/dL   HCT 62.1 30.8 - 65.7 %   MCV 93.2 80.0 - 100.0 fL   MCH 31.6 26.0 - 34.0 pg   MCHC 34.0 30.0 - 36.0 g/dL   RDW 84.6 96.2 - 95.2 %   Platelets 340 150 - 400 K/uL   nRBC 0.0 0.0 - 0.2 %    Comment: Performed at [facility], [address], [place], [place] [address]  PTH, intact and calcium     Status: Abnormal   Collection Time: 03/01/23 12:18 PM  Result Value Ref Range   Calcium 10.6 (H) 8.7 - 10.3 mg/dL   PTH 63 15 - 65 pg/mL   PTH Interp Comment  Comment: Interpretation                 Intact PTH    Calcium                                 (pg/mL)      (mg/dL) Normal                          15 - 65     8.6 - 10.2 Primary Hyperparathyroidism         >65          >10.2 Secondary Hyperparathyroidism       >65          <10.2 Non-Parathyroid Hypercalcemia       <65          >10.2 Hypoparathyroidism                  <15          < 8.6 Non-Parathyroid Hypocalcemia    15 - 65          < 8.6   Cytology - Non PAP; RIGHT THYROID LOBE     Status: None   Collection Time: 03/05/23  3:47 PM  Result Value Ref Range   CYTOLOGY - NON GYN      CYTOLOGY - NON PAP CASE: [id number] PATIENT: Anedra Depner Non-Gynecological Cytology Report     Clinical History: Nodule #1: Right; Mid, Maximum  size: 3.2 cm; Other 2 dimensions: 1.7 x 2.8 cm, previously 3.0 x 2.0 x 2.5 cm, solid/almost completely solid, isoechoic, TI-RADS total points: 3. Specimen Submitted:  A. THYROID, RT MID, FINE NEEDLE ASPIRATION   FINAL MICROSCOPIC DIAGNOSIS: - Benign follicular nodule (Bethesda category II)  SPECIMEN ADEQUACY: Satisfactory for evaluation  GROSS: Received is/are 30 cc's of peach cytolyt solution. (EMH:emh) Prepared: Smears:  0 Concentration Method (Thin Prep):  1 Cell Block:  1 conventional. Additional Studies: Afirma collected.     Final Diagnosis performed by Lance Coon, MD.   Electronically signed 03/08/2023 Technical and / or Professional components performed at [facility]. [facility], [address], [place], [place] [address].  Immunohistochemistry Technical component (if applicable) was performed  at [facility]. [address], [place], [place] [address].   IMMUNOHISTOCHEMISTRY DISCLAIMER (if applicable): Some of these immunohistochemical stains may have been developed and the performance characteristics determine by [facility]. Some may not have been cleared or approved by the U.S. Food and Drug Administration. The FDA has determined that such clearance or approval is not necessary. This test is used for clinical purposes. It should not be regarded as investigational or for research. This laboratory is certified under the Clinical Laboratory Improvement Amendments of 1988 (CLIA-88) as qualified to perform high complexity clinical laboratory testing.  The controls stained appropriately.   IHC stains are performed on formalin fixed, paraffin embedded tissue using a 3,3"diaminobenzidine (DAB) chromogen and Leica Bond Autostainer System. The staining intensity of the nucleus is score manually and is reported as the percenta ge of tumor cell nuclei demonstrating specific nuclear staining. The specimens are fixed in 10% Neutral  Formalin for at least 6 hours and up to 72hrs. These tests are validated on decalcified tissue. Results should be interpreted with caution given the possibility of false negative results on decalcified specimens. Antibody Clones are as follows ER-clone 24F,  PR-clone 16, Ki67- clone MM1. Some of these immunohistochemical stains may have been developed and the performance characteristics determined by [facility].   Calcium, urine, 24 hour     Status: None   Collection Time: 03/09/23  9:00 AM  Result Value Ref Range   Calcium, Urine CANCELED mg/dL    Comment: Test not performed. Deterioration occurred during specimen handling. [facility] is providing the patient with re-collection instructions.  Result canceled by the ancillary.    Calcium, 24H Urine CANCELED     Comment: Test not performed  Result canceled by the ancillary.   Creatinine, urine, 24 hour     Status: None   Collection Time: 03/09/23  9:00 AM  Result Value Ref Range   Creatinine, Urine 88.2 Not Estab. mg/dL   Creatinine, 34V Ur 425 800 - 1,800 mg/24 hr  CBC per protocol     Status: None   Collection Time: 03/18/23 11:22 AM  Result Value Ref Range   WBC 6.2 4.0 - 10.5 K/uL   RBC 4.37 3.87 - 5.11 MIL/uL   Hemoglobin 13.9 12.0 - 15.0 g/dL   HCT 95.6 38.7 - 56.4 %   MCV 97.0 80.0 - 100.0 fL   MCH 31.8 26.0 - 34.0 pg   MCHC 32.8 30.0 - 36.0 g/dL   RDW 33.2 95.1 - 88.4 %   Platelets 336 150 - 400 K/uL   nRBC 0.0 0.0 - 0.2 %    Comment: Performed at [facility], [address] [place], [place] [address]  Basic metabolic panel per protocol     Status: Abnormal   Collection Time: 03/18/23 11:22 AM  Result Value Ref Range   Sodium 140 135 - 145 mmol/L   Potassium 4.7 3.5 - 5.1 mmol/L   Chloride 105 98 - 111 mmol/L   CO2 26 22 - 32 mmol/L   Glucose, Bld 91 70 - 99 mg/dL    Comment: Glucose reference range applies only to samples taken after fasting for at least 8 hours.   BUN 25 (H) 6 - 20  mg/dL   Creatinine, Ser 3.01 0.44 - 1.00 mg/dL   Calcium 60.1 8.9 - 09.3 mg/dL   GFR, Estimated >23 >55 mL/min    Comment: (NOTE) Calculated using the CKD-EPI Creatinine Equation (2021)    Anion gap 9 5 - 15    Comment: Performed at [facility], [address], [place], [place] [address]  Surgical pathology     Status: None   Collection Time: 03/22/23 12:37 PM  Result Value Ref Range   SURGICAL PATHOLOGY      SURGICAL PATHOLOGY CASE: [id number] PATIENT: Kellene Blankley Surgical Pathology Report     Clinical History: Primary hyperparathyroidism (crm)     FINAL MICROSCOPIC DIAGNOSIS:  A. RIGHT SUPERIOR PARATHYROID, ADENOMA, PARATHYROIDECTOMY: Hypercellular parathyroid compatible with adenoma measuring 1.3 cm (0.61 g)  INTRAOPERATIVE DIAGNOSIS:  A1. ? PARATHYROID, RIGHT SUPERIOR ADENOMA, FROZEN SECTION:        Hypercellular parathyroid (0.621 g).       Rapid intraoperative consult diagnosis rendered by Dr. Kenyon Ana @ 1325 03/22/2023.   GROSS DESCRIPTION:  Received fresh for rapid intraoperative consult evaluation by frozen section is a 0.621 g, 1.3 cm soft nodule with a homogeneous tan-brown cut surface.  Bisected and entirely submitted in 1 block for frozen section.  SW 03/22/2023    Final Diagnosis performed by Jerene Bears, MD.   Electronically signed 03/23/2023 Technical component performed at [facility], [address] , [place], [place] [address].  Professional component performed at [employer]. [facility], [address] [place], [place] [address].  Immunohistochemistry Technical component (if applicable) was performed at [facility]. [address], [place], [place] [address].   IMMUNOHISTOCHEMISTRY DISCLAIMER (if applicable): Some of these immunohistochemical stains may have been developed and the performance characteristics determine by [facility].  Some may not have been cleared or approved by the U.S. Food and Drug Administration. The FDA has determined that such clearance or approval is not necessary. This test is used for clinical purposes. It should not be regarded as investigational or for research. This laboratory is certified under the Clinical Laboratory Improvement Amendments of 1988 (CLIA-88) as qualified to perform high complexity clinical laboratory testing.  The controls stained appropriately.   IHC stains are performed on f ormalin fixed, paraffin embedded tissue using a 3,3"diaminobenzidine (DAB) chromogen and Leica Bond Autostainer System. The staining intensity of the nucleus is score manually and is reported as the percentage of tumor cell nuclei demonstrating specific nuclear staining. The specimens are fixed in 10% Neutral Formalin for at least 6 hours and up to 72hrs. These tests are validated on decalcified tissue. Results should be interpreted with caution given the possibility of false negative results on decalcified specimens. Antibody Clones are as follows ER-clone 31F, PR-clone 16, Ki67- clone MM1. Some of these immunohistochemical stains may have been developed and the performance characteristics determined by [facility].   TSH     Status: None   Collection Time: 04/14/23  8:42 AM  Result Value Ref Range   TSH 1.160 0.450 - 4.500 uIU/mL  T4, free     Status: Abnormal   Collection Time: 04/14/23  8:42 AM  Result Value Ref Range   Free T4 0.80 (L) 0.82 - 1.77 ng/dL  PTH, intact and calcium     Status: None   Collection Time: 04/14/23  8:42 AM  Result Value Ref Range   PTH 49 15 - 65 pg/mL   PTH Interp Comment     Comment: Interpretation                 Intact PTH    Calcium                                 (pg/mL)      (mg/dL) Normal                          15 - 65     8.6 - 10.2 Primary Hyperparathyroidism         >65          >10.2 Secondary Hyperparathyroidism       >65           <10.2 Non-Parathyroid Hypercalcemia       <65          >10.2 Hypoparathyroidism                  <15          < 8.6 Non-Parathyroid Hypocalcemia    15 - 65          < 8.6   Comprehensive metabolic panel     Status: Abnormal   Collection Time: 04/14/23  8:42 AM  Result Value Ref Range   Glucose 107 (H) 70 - 99 mg/dL  BUN 17 8 - 27 mg/dL   Creatinine, Ser 3.32 0.57 - 1.00 mg/dL   eGFR 77 >95 JO/ACZ/6.60   BUN/Creatinine Ratio 20 12 - 28   Sodium 141 134 - 144 mmol/L   Potassium 4.1 3.5 - 5.2 mmol/L   Chloride 100 96 - 106 mmol/L   CO2 25 20 - 29 mmol/L   Calcium 10.2 8.7 - 10.3 mg/dL   Total Protein 7.5 6.0 - 8.5 g/dL   Albumin 5.1 (H) 3.9 - 4.9 g/dL   Globulin, Total 2.4 1.5 - 4.5 g/dL   Bilirubin Total 0.3 0.0 - 1.2 mg/dL   Alkaline Phosphatase 197 (H) 44 - 121 IU/L   AST 175 (H) 0 - 40 IU/L   ALT 173 (H) 0 - 32 IU/L   Parathyroid scan on 11/08/2022 IMPRESSION: Retained focal radiotracer activity posterior to the mid RIGHT thyroid gland is most consistent with parathyroid adenoma.  Assessment: 1. 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  Osteoporosis 3.  Multinodular goiter-status post fine-needle aspiration in 2017 and 2024 4.  Vitamin D  deficiency 5. Hyperlipidemia  Plan: -I reviewed and discussed her most recent labs and imaging findings with her.  She has confirmed diagnosis of primary hyperparathyroidism with hypercalcemia and osteoporosis.  She is status post right superior parathyroidectomy with benign outcomes and control of hypercalcemia.   -She has osteoporosis of the hips and osteopenia of the spine,  treated with Reclast before.  Her most recent Reclast infusion was held in the process of her surgery.  In light of her unexplained transaminitis, she is advised to hold off until next measurement.  She is advised to avoid Percocet if possible.  If she presents with favorable CMP, she will be resumed on her Reclast infusion.    Her previous IV Reclast  treatments were usually  conducted between October-November of each year.  Up to 6 years (2 more treatments )   is planned for her.   -She is now vitamin D replete, at this time, advised to continue maintenance dose , vitamin D3 2000 units daily.  No history of  nephrolithiasis, fragility fractures. No abdominal pain, no major mood disorders, no bone pain.  Her next DXA is in July 2025. -Regarding her multinodular goiter :  - she is status post fine-needle aspiration of the right-sided thyroid lobe nodule in September 2017 with benign follicular adenoma , and again in 2024. She does not require surgical intervention at this time.  Her thyroid function tests are within normal limits.    -Her previsit labs show low free T4, likely the effect of her recent surgery.  She will need a repeat TSH/free T4 to assess her thyroid function.  Regarding her dyslipidemia with LDL of 133: Currently on Crestor 10 mg p.o. nightly.  She continues to tolerate medication.  She is advised to continue along with her whole food plant-based diet.    She is advised to maintain close follow-up with her PMD.   I spent  22  minutes in the care of the patient today including review of labs from Thyroid Function, CMP, and other relevant labs ; imaging/biopsy records (current and previous including abstractions from other facilities); face-to-face time discussing  her lab results and symptoms, medications doses, her options of short and long term treatment based on the latest standards of care / guidelines;   and documenting the encounter.  [name]  participated in the discussions, expressed understanding, and voiced agreement with the above plans.  All  questions were answered to her satisfaction. she is encouraged to contact clinic should she have any questions or concerns prior to her return visit.   - Return in about 5 weeks (around 05/24/2023) for F/U with Pre-visit Labs.   Marquis Lunch, MD [facility] [address] [place], [place] [address] Phone: [phone]  Fax: [phone]    This note was partially dictated with voice recognition software. Similar sounding words can be transcribed inadequately or may not  be corrected upon review.  04/19/2023, 2:46 PM

## 2023-04-20 ENCOUNTER — Telehealth: Payer: Self-pay | Admitting: Pharmacy Technician

## 2023-04-20 NOTE — Telephone Encounter (Signed)
Auth Submission: NO AUTH NEEDED Site of care: Site of care: [facility] Payer: [employer] MEDICARE Medication & CPT/J Code(s) submitted: Reclast (Zolendronic acid) [id number] Route of submission (phone, fax, portal):  Phone # Fax # Auth type: Buy/Bill PB Units/visits requested: 1 Reference number:  Approval from: 04/20/23 to 07/21/23

## 2023-04-29 ENCOUNTER — Telehealth: Payer: Self-pay | Admitting: Pharmacy Technician

## 2023-04-29 ENCOUNTER — Encounter: Payer: Self-pay | Admitting: "Endocrinology

## 2023-04-29 NOTE — Telephone Encounter (Signed)
Auth Submission: NO AUTH NEEDED Site of care: Site of care: [facility] Payer: [employer]  MEDICARE Medication & CPT/J Code(s) submitted: Reclast (Zolendronic acid) [id number] Route of submission (phone, fax, portal):  Phone # Fax # Auth type: Buy/Bill PB Units/visits requested: X1 Reference number:  Approval from: 04/29/23 to 08/31/23

## 2023-05-14 ENCOUNTER — Ambulatory Visit: Payer: Medicare HMO | Admitting: Nurse Practitioner

## 2023-05-24 ENCOUNTER — Ambulatory Visit: Payer: PRIVATE HEALTH INSURANCE | Admitting: "Endocrinology

## 2023-06-08 ENCOUNTER — Ambulatory Visit: Payer: Medicare HMO | Admitting: Family Medicine

## 2023-06-10 ENCOUNTER — Ambulatory Visit (INDEPENDENT_AMBULATORY_CARE_PROVIDER_SITE_OTHER): Payer: Medicare HMO | Admitting: Gastroenterology

## 2023-06-21 ENCOUNTER — Other Ambulatory Visit: Payer: Self-pay | Admitting: Family Medicine

## 2023-06-21 ENCOUNTER — Telehealth: Payer: Self-pay | Admitting: Family Medicine

## 2023-06-21 MED ORDER — OXYCODONE-ACETAMINOPHEN 10-325 MG PO TABS: 1.0000 | ORAL_TABLET | ORAL | 0 refills | Status: DC | PRN

## 2023-06-21 NOTE — Telephone Encounter (Signed)
Duplicate message - see other message from today.

## 2023-06-21 NOTE — Telephone Encounter (Signed)
Enough medication was sent in to cover her until her follow-up visit thank you

## 2023-06-21 NOTE — Telephone Encounter (Signed)
Prescription Request  06/21/2023  LOV: 03/11/2023  What is the name of the medication or equipment? oxyCODONE-acetaminophen (PERCOCET) 10-325 MG tablet   Have you contacted your pharmacy to request a refill? Yes   Which pharmacy would you like this sent to? [facility] [place]  Patient notified that their request is being sent to the clinical staff for review and that they should receive a response within 2 business days.   Please advise at [facility] [phone]

## 2023-06-21 NOTE — Telephone Encounter (Signed)
Refill on oxyCODONE-acetaminophen (PERCOCET) 10-325 MG tablet  has appointment 11/6 for medication

## 2023-06-22 ENCOUNTER — Encounter: Payer: Self-pay | Admitting: *Deleted

## 2023-07-07 ENCOUNTER — Ambulatory Visit: Payer: Medicare HMO | Admitting: Family Medicine

## 2023-07-07 ENCOUNTER — Encounter: Payer: Self-pay | Admitting: Family Medicine

## 2023-07-07 VITALS — BP 114/78 | HR 105 | Temp 98.2°F | Ht 64.0 in | Wt 148.6 lb

## 2023-07-07 DIAGNOSIS — R5383 Other fatigue: Secondary | ICD-10-CM

## 2023-07-07 DIAGNOSIS — G4440 Drug-induced headache, not elsewhere classified, not intractable: Secondary | ICD-10-CM

## 2023-07-07 DIAGNOSIS — F321 Major depressive disorder, single episode, moderate: Secondary | ICD-10-CM

## 2023-07-07 DIAGNOSIS — I10 Essential (primary) hypertension: Secondary | ICD-10-CM

## 2023-07-07 DIAGNOSIS — Z79891 Long term (current) use of opiate analgesic: Secondary | ICD-10-CM

## 2023-07-07 MED ORDER — OXYCODONE-ACETAMINOPHEN 10-325 MG PO TABS: 1.0000 | ORAL_TABLET | ORAL | 0 refills | Status: DC | PRN

## 2023-07-07 MED ORDER — OXYCODONE-ACETAMINOPHEN 10-325 MG PO TABS: ORAL_TABLET | ORAL | 0 refills | Status: DC

## 2023-07-07 NOTE — Progress Notes (Signed)
Subjective:    Patient ID: Cheryl Rowe, female    DOB: 09-21-1960, 62 y.o.   MRN: [id number]  Discussed the use of AI scribe software for clinical note transcription with the patient, who gave verbal consent to proceed.  History of Present Illness   The patient, with a history of back surgery, presents with severe lower back pain that has been ongoing for approximately three weeks. The pain was triggered by a strenuous attempt to lift her mother after a fall. The patient describes the pain as if a rod in the lower back is twisting her flesh, particularly when she twists her body. The pain is severe enough to elicit screams. The patient also reports muscle spasms in the lower back region.  In addition to the physical discomfort, the patient is experiencing significant emotional distress. Her mother's health is declining, and a close family member recently passed away from cancer. The patient expresses feelings of hopelessness and contemplates moving back to [place] to live alone.  The patient has been adhering to her prescribed medication regimen, which includes blood pressure medication and Effexor. However, she has been self-medicating with four to six aspirin daily for the past three months due to persistent headaches. The patient also reports a decrease in appetite, with food often feeling stuck in her chest after eating. She has been taking an acid blocker in the morning to manage this symptom.  Despite the back pain, the patient has been able to maintain some level of physical activity, including running errands and going to [employer]. However, she prefers to stay in her room when at home due to the stressful family environment. The patient has expressed a desire to reduce her pain medication intake from five to four pills per day.         Review of Systems     Objective:    Physical Exam   VITALS: BP 114/82 CHEST: Breath sounds clear.     General-in no acute distress Eyes-no  discharge Lungs-respiratory rate normal, CTA CV-no murmurs,RRR Extremities skin warm dry no edema Neuro grossly normal Behavior normal, alert Tenderness in the lower spine.  Difficulty with standing up out of a chair      Assessment & Plan:  Assessment and Plan    Lower Back Pain Acute onset following physical strain, with severe pain and muscle spasms in the lower back. No mention of neurological symptoms. -Consider physical therapy if pain persists.  Chronic Pain Management Patient has been self-managing with 4-5 pain pills daily, but wishes to reduce to 4 per day. -Reduce pain medication to 4 per day as per patient's request.  Aspirin Overuse Patient reports taking 4-6 aspirins daily for the past three months due to headaches, which may be contributing to elevated liver enzymes. -Advise patient to discontinue aspirin use. -Research and propose alternative headache management strategies via MyChart.  Emotional Stress Patient is experiencing significant emotional stress due to family issues and recent bereavement, which may be contributing to physical symptoms. -Suggest follow-up in 4 weeks to monitor emotional well-being and physical symptoms.  Elevated Liver Enzymes Likely related to aspirin overuse. -Plan to recheck liver enzymes at next visit.  Gastroesophageal Reflux Disease (GERD) Patient reports feeling full after eating small amounts and experiencing nocturnal gurgling, despite taking an acid blocker. -Consider adding an additional medication to manage symptoms.  Hypertension Patient reports taking blood pressure medication as prescribed. -Continue current medication regimen.  Depression Patient expresses feelings of hopelessness and a lack of reasons  to live. -Continue Effexor 3 times daily. -Consider referral to mental health services if symptoms persist or worsen.  General Health Maintenance / Followup Plans -Plan to follow up in 4 weeks to monitor  emotional well-being, physical symptoms, and response to changes in medication regimen. -Research and propose alternative headache management strategies via MyChart. -Plan to recheck liver enzymes at next visit.     1. Encounter for long-term opiate analgesic use The patient was seen in followup for chronic pain. A review over at their current pain status was discussed. Drug registry was checked. Prescriptions were given.  Regular follow-up recommended. Discussion was held regarding the importance of compliance with medication as well as pain medication contract.  Patient was informed that medication may cause drowsiness and should not be combined  with other medications/alcohol or street drugs. If the patient feels medication is causing altered alertness then do not drive or operate dangerous equipment.  Should be noted that the patient appears to be meeting appropriate use of opioids and response.  Evidenced by improved function and decent pain control without significant side effects and no evidence of overt aberrancy issues.  Upon discussion with the patient today they understand that opioid therapy is optional and they feel that the pain has been refractory to reasonable conservative measures and is significant and affecting quality of life enough to warrant ongoing therapy and wishes to continue opioids.  Refills were provided.  [facility] medical Board guidelines regarding the pain medicine has been reviewed.  CDC guidelines most updated 2022 has been reviewed by the prescriber.  PDMP is checked on a regular basis yearly urine drug screen and pain management contract  Shared discussion-Taper down on pain medicine to 4/day 2. Primary hypertension HTN- patient seen for follow-up regarding HTN.   Diet, medication compliance, appropriate labs and refills were completed.   Importance of keeping blood pressure under good control to lessen the risk of complications discussed Regular  follow-up visits discussed   3. Depression, major, single episode, moderate (HCC) Effexor continue current measures  4. Other fatigue Fatigue tiredness related to having to take care of her elderly dad patient looking at moving back to her home to give herself some space  5. Analgesic rebound headache Patient was counseled to get away from the Excedrin migraines we will look at various options and inform the patient of a treatment plan

## 2023-07-09 ENCOUNTER — Ambulatory Visit: Payer: PRIVATE HEALTH INSURANCE | Admitting: "Endocrinology

## 2023-07-10 ENCOUNTER — Encounter: Payer: Self-pay | Admitting: Family Medicine

## 2023-07-14 ENCOUNTER — Encounter: Payer: Self-pay | Admitting: Family Medicine

## 2023-07-14 ENCOUNTER — Other Ambulatory Visit: Payer: Self-pay | Admitting: Family Medicine

## 2023-07-14 MED ORDER — TOPIRAMATE 50 MG PO TABS: 50.0000 mg | ORAL_TABLET | Freq: Two times a day (BID) | ORAL | 5 refills | Status: DC

## 2023-07-31 ENCOUNTER — Other Ambulatory Visit: Payer: Self-pay | Admitting: Family Medicine

## 2023-08-04 ENCOUNTER — Ambulatory Visit (INDEPENDENT_AMBULATORY_CARE_PROVIDER_SITE_OTHER): Payer: Medicare HMO | Admitting: Family Medicine

## 2023-08-04 DIAGNOSIS — R7401 Elevation of levels of liver transaminase levels: Secondary | ICD-10-CM | POA: Diagnosis not present

## 2023-08-04 DIAGNOSIS — I10 Essential (primary) hypertension: Secondary | ICD-10-CM | POA: Diagnosis not present

## 2023-08-04 DIAGNOSIS — Z79891 Long term (current) use of opiate analgesic: Secondary | ICD-10-CM

## 2023-08-04 MED ORDER — OXYCODONE-ACETAMINOPHEN 10-325 MG PO TABS: ORAL_TABLET | ORAL | 0 refills | Status: DC

## 2023-08-04 MED ORDER — OXYCODONE-ACETAMINOPHEN 10-325 MG PO TABS: 1.0000 | ORAL_TABLET | ORAL | 0 refills | Status: DC | PRN

## 2023-08-07 NOTE — Progress Notes (Signed)
Subjective:    Patient ID: Cheryl Rowe, female    DOB: 08-07-1961, 62 y.o.   MRN: [id number]  Discussed the use of AI scribe software for clinical note transcription with the patient, who gave verbal consent to proceed.  History of Present Illness   The patient, with a history of chronic headaches, reports discontinuing Topamax due to intolerable side effects, including dizziness and a general feeling of discomfort. The patient attempted to resume the medication but found the adverse effects too severe to continue. The headaches persist, with the patient describing a severe headache at the time of the consultation.  The patient is also in the process of tapering down from a medication Excedrin migraine, , currently at a dose of four and a half tablets. The patient plans to reduce the dosage slowly, splitting tablets as needed to maintain a steady decrease. The patient has expressed a desire to continue at the current dosage for a while longer before proceeding with the reduction.  In terms of pain management, the patient reports a generally successful adherence to the prescribed regimen. On a particularly challenging day, the patient needed to take five tablets, but on a less painful day, managed with only three. The patient is unsure of the exact date of the last prescription fill.  From a nutritional perspective, the patient reports eating adequately. The patient also notes a slight improvement in mood, attributing this to acceptance of personal family issues. The patient expresses concern for a family member's health, noting decreased appetite and upcoming gastroenterology appointment for this individual.  The patient keeps pain medications in a secure location and is aware of the need to bring in medication for verification. The patient has agreed to complete follow-up blood work, although prefers to delay this until after the holiday season due to personal reasons. The patient's liver  enzymes were mildly elevated in a previous test, and the patient acknowledges the need for retesting. The patient agrees to notify the clinic once the blood work is completed.     This patient was seen today for chronic pain  The medication list was reviewed and updated.   Location of Pain for which the patient has been treated with regarding narcotics: Thoracic and lumbar spine  Onset of this pain: Present for years   -Compliance with medication: Good compliance with medicine  - Number patient states they take daily: 4 pills daily  -Reason for ongoing use of opioids previous surgeries, failed resolution of her pain from surgeries, previous severe scoliosis nerve impingement herniated disc  What other measures have been tried outside of opioids Tylenol NSAIDs injections surgery  In the ongoing specialists regarding this condition no longer seeing the back specialist  -when was the last dose patient took?  Yesterday  The patient was advised the importance of maintaining medication and not using illegal substances with these.  Here for refills and follow up  The patient was educated that we can provide 3 monthly scripts for their medication, it is their responsibility to follow the instructions.  Side effects or complications from medications: Denies side effects  Patient is aware that pain medications are meant to minimize the severity of the pain to allow their pain levels to improve to allow for better function. They are aware of that pain medications cannot totally remove their pain.  Due for UDT ( at least once per year) (pain management contract is also completed at the time of the UDT): January 2024 show needed on her  next visit  Scale of 1 to 10 ( 1 is least 10 is most) Your pain level without the medicine: 9 Your pain level with medication 6  Scale 1 to 10 ( 1-helps very little, 10 helps very well) How well does your pain medication reduce your pain so you can function  better through out the day?  8  Quality of the pain: Throbbing aching  Persistence of the pain: Present all the time  Modifying factors: Worse with activity         Review of Systems     Objective:    Physical Exam        General-in no acute distress Eyes-no discharge Lungs-respiratory rate normal, CTA CV-no murmurs,RRR Extremities skin warm dry no edema Neuro grossly normal Behavior normal, alert        Assessment & Plan:  Assessment and Plan    Migraine Discontinued Topamax due to intolerable side effects. Current severe headache. -No further plan discussed in the conversation.    I     General Health Maintenance -Complete blood work before Christmas to monitor liver enzymes (last checked in August and were mildly elevated). -Follow up appointment in late February.      Patient will be working on tapering the Excedrin Migraine gradually and she has a scheduled follow-up visit   The patient was seen in followup for chronic pain. A review over at their current pain status was discussed. Drug registry was checked. Prescriptions were given.  Regular follow-up recommended. Discussion was held regarding the importance of compliance with medication as well as pain medication contract.  Patient was informed that medication may cause drowsiness and should not be combined  with other medications/alcohol or street drugs. If the patient feels medication is causing altered alertness then do not drive or operate dangerous equipment.  Should be noted that the patient appears to be meeting appropriate use of opioids and response.  Evidenced by improved function and decent pain control without significant side effects and no evidence of overt aberrancy issues.  Upon discussion with the patient today they understand that opioid therapy is optional and they feel that the pain has been refractory to reasonable conservative measures and is significant and affecting quality of  life enough to warrant ongoing therapy and wishes to continue opioids.  Refills were provided.  [facility] medical Board guidelines regarding the pain medicine has been reviewed.  CDC guidelines most updated 2022 has been reviewed by the prescriber.  PDMP is checked on a regular basis yearly urine drug screen and pain management contract

## 2023-09-04 ENCOUNTER — Other Ambulatory Visit: Payer: Self-pay | Admitting: Family Medicine

## 2023-09-17 ENCOUNTER — Encounter: Payer: Self-pay | Admitting: Family Medicine

## 2023-09-24 ENCOUNTER — Ambulatory Visit (INDEPENDENT_AMBULATORY_CARE_PROVIDER_SITE_OTHER): Payer: Medicare HMO

## 2023-09-24 VITALS — Ht 64.0 in | Wt 143.0 lb

## 2023-09-24 DIAGNOSIS — Z1231 Encounter for screening mammogram for malignant neoplasm of breast: Secondary | ICD-10-CM

## 2023-09-24 DIAGNOSIS — Z0000 Encounter for general adult medical examination without abnormal findings: Secondary | ICD-10-CM

## 2023-09-24 NOTE — Patient Instructions (Signed)
Cheryl Rowe , Thank you for taking time to come for your Medicare Wellness Visit. I appreciate your ongoing commitment to your health goals. Please review the following plan we discussed and let me know if I can assist you in the future.   Referrals/Orders/Follow-Ups/Clinician Recommendations:   Next Medicare Annual Wellness Visit:October 06, 2024 at 8:00 am virtual visit  You have an order for:  []   2D Mammogram  [x]   3D Mammogram  []   Bone Density   []   Lung Cancer Screening  Please call for appointment:   [facility] [address]Radiology [place], [place] [address] [phone]  Make sure to wear two-piece clothing.  No lotions powders or deodorants the day of the appointment Make sure to bring picture ID and insurance card.  Bring list of medications you are currently taking including any supplements.   Schedule your [place] screening mammogram through MyChart!   Log into your MyChart account.  Go to 'Visit' (or 'Appointments' if on mobile App) --> Schedule an Appointment  Under 'Select a Reason for Visit' choose the Mammogram Screening option.  Complete the pre-visit questions and select the time and place that best fits your schedule.    This is a list of the screening recommended for you and due dates:  Health Maintenance  Topic Date Due   Pap with HPV screening  Never done   Mammogram  03/06/2022   Flu Shot  11/29/2023*   Medicare Annual Wellness Visit  09/23/2024   Colon Cancer Screening  03/25/2025   Hepatitis C Screening  Completed   HIV Screening  Completed   Zoster (Shingles) Vaccine  Completed   HPV Vaccine  Aged Out   DTaP/Tdap/Td vaccine  Discontinued   COVID-19 Vaccine  Discontinued  *Topic was postponed. The date shown is not the original due date.    Advanced directives: (Provided) Advance directive discussed with you today. I have provided a copy for you to complete at home and have notarized. Once this is  complete, please bring a copy in to our office so we can scan it into your chart.   Next Medicare Annual Wellness Visit scheduled for next year: yes  Preventive Care 53-43 Years Old, Female Preventive care refers to lifestyle choices and visits with your health care provider that can promote health and wellness. Preventive care visits are also called wellness exams. What can I expect for my preventive care visit? Counseling Your health care provider may ask you questions about your: Medical history, including: Past medical problems. Family medical history. Pregnancy history. Current health, including: Menstrual cycle. Method of birth control. Emotional well-being. Home life and relationship well-being. Sexual activity and sexual health. Lifestyle, including: Alcohol, nicotine or tobacco, and drug use. Access to firearms. Diet, exercise, and sleep habits. Work and work Astronomer. Sunscreen use. Safety issues such as seatbelt and bike helmet use. Physical exam Your health care provider will check your: Height and weight. These may be used to calculate your BMI (body mass index). BMI is a measurement that tells if you are at a healthy weight. Waist circumference. This measures the distance around your waistline. This measurement also tells if you are at a healthy weight and may help predict your risk of certain diseases, such as type 2 diabetes and high blood pressure. Heart rate and blood pressure. Body temperature. Skin for abnormal spots. What immunizations do I need?  Vaccines are usually given at various ages, according to a schedule. Your  health care provider will recommend vaccines for you based on your age, medical history, and lifestyle or other factors, such as travel or where you work. What tests do I need? Screening Your health care provider may recommend screening tests for certain conditions. This may include: Lipid and cholesterol levels. Diabetes screening. This  is done by checking your blood sugar (glucose) after you have not eaten for a while (fasting). Pelvic exam and Pap test. Hepatitis B test. Hepatitis C test. HIV (human immunodeficiency virus) test. STI (sexually transmitted infection) testing, if you are at risk. Lung cancer screening. Colorectal cancer screening. Mammogram. Talk with your health care provider about when you should start having regular mammograms. This may depend on whether you have a family history of breast cancer. BRCA-related cancer screening. This may be done if you have a family history of breast, ovarian, tubal, or peritoneal cancers. Bone density scan. This is done to screen for osteoporosis. Talk with your health care provider about your test results, treatment options, and if necessary, the need for more tests. Follow these instructions at home: Eating and drinking  Eat a diet that includes fresh fruits and vegetables, whole grains, lean protein, and low-fat dairy products. Take vitamin and mineral supplements as recommended by your health care provider. Do not drink alcohol if: Your health care provider tells you not to drink. You are pregnant, may be pregnant, or are planning to become pregnant. If you drink alcohol: Limit how much you have to 0-1 drink a day. Know how much alcohol is in your drink. In the [place] one drink equals one 12 oz bottle of beer (355 mL), one 5 oz glass of wine (148 mL), or one 1 oz glass of hard liquor (44 mL). Lifestyle Brush your teeth every morning and night with fluoride toothpaste. Floss one time each day. Exercise for at least 30 minutes 5 or more days each week. Do not use any products that contain nicotine or tobacco. These products include cigarettes, chewing tobacco, and vaping devices, such as e-cigarettes. If you need help quitting, ask your health care provider. Do not use drugs. If you are sexually active, practice safe sex. Use a condom or other form of protection to  prevent STIs. If you do not wish to become pregnant, use a form of birth control. If you plan to become pregnant, see your health care provider for a prepregnancy visit. Take aspirin only as told by your health care provider. Make sure that you understand how much to take and what form to take. Work with your health care provider to find out whether it is safe and beneficial for you to take aspirin daily. Find healthy ways to manage stress, such as: Meditation, yoga, or listening to music. Journaling. Talking to a trusted person. Spending time with friends and family. Minimize exposure to UV radiation to reduce your risk of skin cancer. Safety Always wear your seat belt while driving or riding in a vehicle. Do not drive: If you have been drinking alcohol. Do not ride with someone who has been drinking. When you are tired or distracted. While texting. If you have been using any mind-altering substances or drugs. Wear a helmet and other protective equipment during sports activities. If you have firearms in your house, make sure you follow all gun safety procedures. Seek help if you have been physically or sexually abused. What's next? Visit your health care provider once a year for an annual wellness visit. Ask your health care provider how  often you should have your eyes and teeth checked. Stay up to date on all vaccines. This information is not intended to replace advice given to you by your health care provider. Make sure you discuss any questions you have with your health care provider. Document Revised: 02/12/2021 Document Reviewed: 02/12/2021 Elsevier Patient Education  2024 [employer]. Understanding Your Risk for Falls Millions of people have serious injuries from falls each year. It is important to understand your risk of falling. Talk with your health care provider about your risk and what you can do to lower it. If you do have a serious fall, make sure to tell your provider.  Falling once raises your risk of falling again. How can falls affect me? Serious injuries from falls are common. These include: Broken bones, such as hip fractures. Head injuries, such as traumatic brain injuries (TBI) or concussions. A fear of falling can cause you to avoid activities and stay at home. This can make your muscles weaker and raise your risk for a fall. What can increase my risk? There are a number of risk factors that increase your risk for falling. The more risk factors you have, the higher your risk of falling. Serious injuries from a fall happen most often to people who are older than 62 years old. Teenagers and young adults ages 74-29 are also at higher risk. Common risk factors include: Weakness in the lower body. Being generally weak or confused due to long-term (chronic) illness. Dizziness or balance problems. Poor vision. Medicines that cause dizziness or drowsiness. These may include: Medicines for your blood pressure, heart, anxiety, insomnia, or swelling (edema). Pain medicines. Muscle relaxants. Other risk factors include: Drinking alcohol. Having had a fall in the past. Having foot pain or wearing improper footwear. Working at a dangerous job. Having any of the following in your home: Tripping hazards, such as floor clutter or loose rugs. Poor lighting. Pets. Having dementia or memory loss. What actions can I take to lower my risk of falling?     Physical activity Stay physically fit. Do strength and balance exercises. Consider taking a regular class to build strength and balance. Yoga and tai chi are good options. Vision Have your eyes checked every year and your prescription for glasses or contacts updated as needed. Shoes and walking aids Wear non-skid shoes. Wear shoes that have rubber soles and low heels. Do not wear high heels. Do not walk around the house in socks or slippers. Use a cane or walker as told by your provider. Home  safety Attach secure railings on both sides of your stairs. Install grab bars for your bathtub, shower, and toilet. Use a non-skid mat in your bathtub or shower. Attach bath mats securely with double-sided, non-slip rug tape. Use good lighting in all rooms. Keep a flashlight near your bed. Make sure there is a clear path from your bed to the bathroom. Use night-lights. Do not use throw rugs. Make sure all carpeting is taped or tacked down securely. Remove all clutter from walkways and stairways, including extension cords. Repair uneven or broken steps and floors. Avoid walking on icy or slippery surfaces. Walk on the grass instead of on icy or slick sidewalks. Use ice melter to get rid of ice on walkways in the winter. Use a cordless phone. Questions to ask your health care provider Can you help me check my risk for a fall? Do any of my medicines make me more likely to fall? Should I take a vitamin  D supplement? What exercises can I do to improve my strength and balance? Should I make an appointment to have my vision checked? Do I need a bone density test to check for weak bones (osteoporosis)? Would it help to use a cane or a walker? Where to find more information Centers for Disease Control and Prevention, [employer]AD[employer]: TonerPromos.no Community-Based Fall Prevention Programs: TonerPromos.no [employer] on Aging: BaseRingTones.pl Contact a health care provider if: You fall at home. You are afraid of falling at home. You feel weak, drowsy, or dizzy. This information is not intended to replace advice given to you by your health care provider. Make sure you discuss any questions you have with your health care provider. Document Revised: 04/20/2022 Document Reviewed: 04/20/2022 Elsevier Patient Education  2024 Elsevier Inc.   Managing Pain Without Opioids Opioids are strong medicines used to treat moderate to severe pain. For some people, especially those who have long-term (chronic) pain, opioids may  not be the best choice for pain management due to: Side effects like nausea, constipation, and sleepiness. The risk of addiction (opioid use disorder). The longer you take opioids, the greater your risk of addiction. Pain that lasts for more than 3 months is called chronic pain. Managing chronic pain usually requires more than one approach and is often provided by a team of health care providers working together (multidisciplinary approach). Pain management may be done at a pain management center or pain clinic. How to manage pain without the use of opioids Use non-opioid medicines Non-opioid medicines for pain may include: Over-the-counter or prescription non-steroidal anti-inflammatory drugs (NSAIDs). These may be the first medicines used for pain. They work well for muscle and bone pain, and they reduce swelling. Acetaminophen. This over-the-counter medicine may work well for milder pain but not swelling. Antidepressants. These may be used to treat chronic pain. A certain type of antidepressant (tricyclics) is often used. These medicines are given in lower doses for pain than when used for depression. Anticonvulsants. These are usually used to treat seizures but may also reduce nerve (neuropathic) pain. Muscle relaxants. These relieve pain caused by sudden muscle tightening (spasms). You may also use a pain medicine that is applied to the skin as a patch, cream, or gel (topical analgesic), such as a numbing medicine. These may cause fewer side effects than medicines taken by mouth. Do certain therapies as directed Some therapies can help with pain management. They include: Physical therapy. You will do exercises to gain strength and flexibility. A physical therapist may teach you exercises to move and stretch parts of your body that are weak, stiff, or painful. You can learn these exercises at physical therapy visits and practice them at home. Physical therapy may also involve: Massage. Heat  wraps or applying heat or cold to affected areas. Electrical signals that interrupt pain signals (transcutaneous electrical nerve stimulation, TENS). Weak lasers that reduce pain and swelling (low-level laser therapy). Signals from your body that help you learn to regulate pain (biofeedback). Occupational therapy. This helps you to learn ways to function at home and work with less pain. Recreational therapy. This involves trying new activities or hobbies, such as a physical activity or drawing. Mental health therapy, including: Cognitive behavioral therapy (CBT). This helps you learn coping skills for dealing with pain. Acceptance and commitment therapy (ACT) to change the way you think and react to pain. Relaxation therapies, including muscle relaxation exercises and mindfulness-based stress reduction. Pain management counseling. This may be individual, family, or group counseling.  Receive medical treatments Medical treatments for pain management include: Nerve block injections. These may include a pain blocker and anti-inflammatory medicines. You may have injections: Near the spine to relieve chronic back or neck pain. Into joints to relieve back or joint pain. Into nerve areas that supply a painful area to relieve body pain. Into muscles (trigger point injections) to relieve some painful muscle conditions. A medical device placed near your spine to help block pain signals and relieve nerve pain or chronic back pain (spinal cord stimulation device). Acupuncture. Follow these instructions at home Medicines Take over-the-counter and prescription medicines only as told by your health care provider. If you are taking pain medicine, ask your health care providers about possible side effects to watch out for. Do not drive or use heavy machinery while taking prescription opioid pain medicine. Lifestyle  Do not use drugs or alcohol to reduce pain. If you drink alcohol, limit how much you have  to: 0-1 drink a day for women who are not pregnant. 0-2 drinks a day for men. Know how much alcohol is in a drink. In the [place] one drink equals one 12 oz bottle of beer (355 mL), one 5 oz glass of wine (148 mL), or one 1 oz glass of hard liquor (44 mL). Do not use any products that contain nicotine or tobacco. These products include cigarettes, chewing tobacco, and vaping devices, such as e-cigarettes. If you need help quitting, ask your health care provider. Eat a healthy diet and maintain a healthy weight. Poor diet and excess weight may make pain worse. Eat foods that are high in fiber. These include fresh fruits and vegetables, whole grains, and beans. Limit foods that are high in fat and processed sugars, such as fried and sweet foods. Exercise regularly. Exercise lowers stress and may help relieve pain. Ask your health care provider what activities and exercises are safe for you. If your health care provider approves, join an exercise class that combines movement and stress reduction. Examples include yoga and tai chi. Get enough sleep. Lack of sleep may make pain worse. Lower stress as much as possible. Practice stress reduction techniques as told by your therapist. General instructions Work with all your pain management providers to find the treatments that work best for you. You are an important member of your pain management team. There are many things you can do to reduce pain on your own. Consider joining an online or in-person support group for people who have chronic pain. Keep all follow-up visits. This is important. Where to find more information You can find more information about managing pain without opioids from: American Academy of Pain Medicine: painmed.org Institute for Chronic Pain: instituteforchronicpain.org American Chronic Pain Association: theacpa.org Contact a health care provider if: You have side effects from pain medicine. Your pain gets worse or does not  get better with treatments or home therapy. You are struggling with anxiety or depression. Summary Many types of pain can be managed without opioids. Chronic pain may respond better to pain management without opioids. Pain is best managed when you and a team of health care providers work together. Pain management without opioids may include non-opioid medicines, medical treatments, physical therapy, mental health therapy, and lifestyle changes. Tell your health care providers if your pain gets worse or is not being managed well enough. This information is not intended to replace advice given to you by your health care provider. Make sure you discuss any questions you have with your health care  provider. Document Revised: 11/27/2020 Document Reviewed: 11/27/2020 Elsevier Patient Education  2024 [employer].

## 2023-09-24 NOTE — Progress Notes (Signed)
Because this visit was a virtual/telehealth visit,  certain criteria was not obtained, such a blood pressure, CBG if applicable, and timed get up and go. Any medications not marked as "taking" were not mentioned during the medication reconciliation part of the visit. Any vitals not documented were not able to be obtained due to this being a telehealth visit or patient was unable to self-report a recent blood pressure reading due to a lack of equipment at home via telehealth. Vitals that have been documented are verbally provided by the patient.  Interactive audio and video telecommunications were attempted between this provider and patient, however failed, due to patient having technical difficulties OR patient did not have access to video capability.  We continued and completed visit with audio only.  Subjective:   Cheryl Rowe is a 63 y.o. female who presents for Medicare Annual (Subsequent) preventive examination.  Visit Complete: Virtual I connected with  Cheryl Rowe on 09/24/23 by a audio enabled telemedicine application and verified that I am speaking with the correct person using two identifiers.  Patient Location: Home  Provider Location: Home Office  I discussed the limitations of evaluation and management by telemedicine. The patient expressed understanding and agreed to proceed.  Vital Signs: Because this visit was a virtual/telehealth visit, some criteria may be missing or patient reported. Any vitals not documented were not able to be obtained and vitals that have been documented are patient reported.  Patient Medicare AWV questionnaire was completed by the patient on 09/23/2023; I have confirmed that all information answered by patient is correct and no changes since this date.  Cardiac Risk Factors include: advanced age (>76men, >25 women);sedentary lifestyle     Objective:    Today's Vitals   09/24/23 1008 09/24/23 1010  Weight: 143 lb (64.9 kg)   Height: 5\' 4"   (1.626 m)   PainSc:  6    Body mass index is 24.55 kg/m.     09/24/2023   10:01 AM 03/18/2023   11:04 AM 02/07/2023    8:06 AM 03/22/2018    2:59 PM 11/11/2016    9:00 PM 11/09/2016    2:02 PM 03/19/2016    7:20 AM  Advanced Directives  Does Patient Have a Medical Advance Directive? No No No No No No No  Would patient like information on creating a medical advance directive? Yes (MAU/Ambulatory/Procedural Areas - Information given) No - Patient declined  No - Patient declined No - Patient declined No - Patient declined Yes - Educational materials given    Current Medications (verified) Outpatient Encounter Medications as of 09/24/2023  Medication Sig   amLODipine (NORVASC) 10 MG tablet TAKE 1 TABLET BY MOUTH EVERY DAY   aspirin-acetaminophen-caffeine (EXCEDRIN MIGRAINE) 250-250-65 MG tablet Take 2 tablets by mouth daily as needed for headache.   diphenhydrAMINE (BENADRYL) 25 MG tablet Take 25 mg by mouth at bedtime as needed for sleep.   Flaxseed, Linseed, (FLAX SEED OIL) 1000 MG CAPS Take 1,000 mg by mouth daily.   Multiple Vitamin (MULTIVITAMIN WITH MINERALS) TABS tablet Take 1 tablet by mouth daily.   ondansetron (ZOFRAN-ODT) 4 MG disintegrating tablet Take 1 tablet (4 mg total) by mouth every 8 (eight) hours as needed for nausea.   oxyCODONE-acetaminophen (PERCOCET) 10-325 MG tablet Take 1 tablet by mouth every 4 (four) hours as needed for pain.   oxyCODONE-acetaminophen (PERCOCET) 10-325 MG tablet 1 every 4 hours as needed for pain   oxyCODONE-acetaminophen (PERCOCET) 10-325 MG tablet 1 every 4 hours as  needed pain   pantoprazole (PROTONIX) 40 MG tablet Take 1 tablet (40 mg total) by mouth daily.   Polyethyl Glycol-Propyl Glycol (LUBRICATING EYE DROPS OP) Place 1 drop into both eyes daily as needed (dry eyes).   rosuvastatin (CRESTOR) 10 MG tablet TAKE 1 TABLET BY MOUTH MONDAY, WEDNESDAY, AND FRIDAY   valsartan-hydrochlorothiazide (DIOVAN-HCT) 160-12.5 MG tablet TAKE 1 TABLET BY MOUTH  EVERY DAY   venlafaxine XR (EFFEXOR-XR) 75 MG 24 hr capsule TAKE 3 CAPSULES BY MOUTH EVERY DAY   Zoledronic Acid (RECLAST IV) Inject 1 Dose into the vein once. yearly   No facility-administered encounter medications on file as of 09/24/2023.    Allergies (verified) Pravastatin and Cymbalta [duloxetine hcl]   History: Past Medical History:  Diagnosis Date   ADHD (attention deficit hyperactivity disorder)    Allergy    Anxiety    Arthritis    Depression    Family history of adverse reaction to anesthesia    sister has PONV   GERD (gastroesophageal reflux disease)    Heart murmur    HTN (hypertension) 07/10/2021   Hyperlipidemia    Migraine    Osteoporosis    Scoliosis    Teenage   Spondylolisthesis of lumbosacral region    Wears glasses    Past Surgical History:  Procedure Laterality Date   BACK SURGERY     rods   BIOPSY THYROID  03/2023   CERVICAL ABLATION     CHOLECYSTECTOMY     COLONOSCOPY WITH PROPOFOL N/A 03/25/2018   Procedure: COLONOSCOPY WITH PROPOFOL;  Surgeon: Malissa Hippo, MD;  Location: [facility];  Service: Endoscopy;  Laterality: N/A;  9:30   PARATHYROIDECTOMY N/A 03/22/2023   Procedure: NECK EXPLORATION WITH PARATHYROIDECTOMY;  Surgeon: Darnell Level, MD;  Location: [facility];  Service: General;  Laterality: N/A;   POLYPECTOMY  03/25/2018   Procedure: POLYPECTOMY;  Surgeon: Malissa Hippo, MD;  Location: [facility];  Service: Endoscopy;;  hepatic flexure   SPINE SURGERY  2018   TONSILLECTOMY     Family History  Problem Relation Age of Onset   Hypertension Father    Arthritis Father    Arthritis Mother    Social History   Socioeconomic History   Marital status: Widowed    Spouse name: Not on file   Number of children: Not on file   Years of education: Not on file   Highest education level: Associate degree: academic program  Occupational History   Not on file  Tobacco Use   Smoking status: Never   Smokeless tobacco: Never  Vaping  Use   Vaping status: Never Used  Substance and Sexual Activity   Alcohol use: No   Drug use: No   Sexual activity: Not Currently    Birth control/protection: Post-menopausal  Other Topics Concern   Not on file  Social History Narrative   Not on file   Social Drivers of Health   Financial Resource Strain: Low Risk  (09/23/2023)   Overall Financial Resource Strain (CARDIA)    Difficulty of Paying Living Expenses: Not very hard  Food Insecurity: No Food Insecurity (09/23/2023)   Hunger Vital Sign    Worried About Running Out of Food in the Last Year: Never true    Ran Out of Food in the Last Year: Never true  Transportation Needs: No Transportation Needs (09/23/2023)   PRAPARE - Administrator, Civil Service (Medical): No    Lack of Transportation (Non-Medical): No  Physical Activity: Unknown (  09/23/2023)   Exercise Vital Sign    Days of Exercise per Week: 0 days    Minutes of Exercise per Session: Not on file  Stress: Stress Concern Present (09/23/2023)   [employer] of Occupational Health - Occupational Stress Questionnaire    Feeling of Stress : Very much  Social Connections: Socially Isolated (09/23/2023)   Social Connection and Isolation Panel [NHANES]    Frequency of Communication with Friends and Family: More than three times a week    Frequency of Social Gatherings with Friends and Family: More than three times a week    Attends Religious Services: Never    Database administrator or Organizations: No    Attends Banker Meetings: Never    Marital Status: Widowed    Tobacco Counseling Counseling given: Yes   Clinical Intake:  Pre-visit preparation completed: Yes  Pain : 0-10 Pain Score: 6  Pain Type: Chronic pain Pain Location: Back Pain Orientation: Mid Pain Descriptors / Indicators: Constant, Aching, Guarding, Pressure, Heaviness Pain Onset: More than a month ago Pain Frequency: Constant     BMI - recorded: 24.55 Nutritional  Status: BMI of 19-24  Normal Nutritional Risks: None Diabetes: No  How often do you need to have someone help you when you read instructions, pamphlets, or other written materials from your doctor or pharmacy?: 1 - Never  Interpreter Needed?: No  Information entered by :: Maryjean Ka CMA   Activities of Daily Living    09/24/2023   10:19 AM 03/18/2023   11:06 AM  In your present state of health, do you have any difficulty performing the following activities:  Hearing? 0   Vision? 0   Difficulty concentrating or making decisions? 0   Walking or climbing stairs? 0   Dressing or bathing? 0   Doing errands, shopping? 0 0  Preparing Food and eating ? N   Using the Toilet? N   In the past six months, have you accidently leaked urine? N   Do you have problems with loss of bowel control? N   Managing your Medications? N   Managing your Finances? N   Housekeeping or managing your Housekeeping? N     Patient Care Team: Babs Sciara, MD as PCP - General (Family Medicine)  Indicate any recent Medical Services you may have received from other than [facility] providers in the past year (date may be approximate).     Assessment:   This is a routine wellness examination for Ashonte.  Hearing/Vision screen Hearing Screening - Comments:: Patient c/o slight hearing difficulties in right ear. Declines referral at this time   Vision Screening - Comments:: Wears rx glasses - it is time for patient to have yearly eye exam. She will call to schedule that appt.   Sees Dr. Margarito Courser in [place]   Goals Addressed             This Visit's Progress    Patient Stated       Jake Samples my home in [place]. Finish cleaning it out since my husband passed away 3 years ago.        Depression Screen    09/24/2023   10:20 AM 08/04/2023    3:39 PM 07/07/2023    3:34 PM 03/11/2023    2:53 PM 02/12/2023   11:11 AM 12/07/2022    1:55 PM 11/20/2022   11:30 AM  PHQ 2/9 Scores  PHQ - 2 Score 5 2 4 2 2 6  6  PHQ- 9  Score 8 9 16 9 9 21 18     Fall Risk    09/24/2023   10:18 AM 07/07/2023    3:34 PM 03/11/2023    2:53 PM 02/12/2023   11:11 AM 11/20/2022   12:19 PM  Fall Risk   Falls in the past year? 0 0 0 0 0  Number falls in past yr: 0   0 0  Injury with Fall? 0   0 0  Risk for fall due to : No Fall Risks      Follow up Falls prevention discussed;Education provided        MEDICARE RISK AT HOME: Medicare Risk at Home Any stairs in or around the home?: Yes If so, are there any without handrails?: No Home free of loose throw rugs in walkways, pet beds, electrical cords, etc?: Yes Adequate lighting in your home to reduce risk of falls?: Yes Life alert?: No Use of a cane, walker or w/c?: No Grab bars in the bathroom?: Yes Shower chair or bench in shower?: Yes Elevated toilet seat or a handicapped toilet?: No  TIMED UP AND GO:  Was the test performed?  No    Cognitive Function:        09/24/2023   10:15 AM  6CIT Screen  What Year? 0 points  What month? 0 points  What time? 0 points  Count back from 20 0 points  Months in reverse 0 points  Repeat phrase 0 points  Total Score 0 points    Immunizations Immunization History  Administered Date(s) Administered   Hepatitis B 03/14/1998, 04/23/1998   Influenza,inj,Quad PF,6+ Mos 07/01/2018, 07/15/2020, 07/10/2021, 05/26/2022   Moderna Covid-19 Vaccine Bivalent Booster 69yrs & up 07/30/2021   Moderna Sars-Covid-2 Vaccination 07/30/2021   PFIZER SARS-COV-2 Pediatric Vaccination 5-100yrs 09/23/2020, 10/14/2020   Zoster Recombinant(Shingrix) 07/30/2021, 01/22/2023    TDAP Status: Patient declined vaccine Due, Education has been provided regarding the importance of this vaccine. Advised may receive this vaccine at local pharmacy or Health Dept. Aware to provide a copy of the vaccination record if obtained from local pharmacy or Health Dept. Verbalized acceptance and understanding.   Flu Vaccine status: Due, Education has been provided  regarding the importance of this vaccine. Advised may receive this vaccine at local pharmacy or Health Dept. Aware to provide a copy of the vaccination record if obtained from local pharmacy or Health Dept. Verbalized acceptance and understanding.  Pneumococcal vaccine status: Not age appropriate for this patient.   Covid-19 vaccine status: Declined, Education has been provided regarding the importance of this vaccine but patient still declined. Advised may receive this vaccine at local pharmacy or Health Dept.or vaccine clinic. Aware to provide a copy of the vaccination record if obtained from local pharmacy or Health Dept. Verbalized acceptance and understanding.  Qualifies for Shingles Vaccine? No   Zostavax completed No   Shingrix Completed?: Yes  Screening Tests Health Maintenance  Topic Date Due   Medicare Annual Wellness (AWV)  Never done   Cervical Cancer Screening (HPV/Pap Cotest)  Never done   MAMMOGRAM  03/07/2023   INFLUENZA VACCINE  11/29/2023 (Originally 04/01/2023)   Colonoscopy  03/25/2025   Hepatitis C Screening  Completed   HIV Screening  Completed   Zoster Vaccines- Shingrix  Completed   HPV VACCINES  Aged Out   DTaP/Tdap/Td  Discontinued   COVID-19 Vaccine  Discontinued    Health Maintenance  Health Maintenance Due  Topic Date Due   Medicare Annual Wellness (AWV)  Never done   Cervical Cancer Screening (HPV/Pap Cotest)  Never done   MAMMOGRAM  03/07/2023    Colorectal cancer screening: Type of screening: Colonoscopy. Completed 03/25/2018. Repeat every 7 years  Mammogram status: Ordered 09/24/2023. Pt provided with contact info and advised to call to schedule appt.   Bone Density Screening: Not age appropriate for this patient.    Lung Cancer Screening: (Low Dose CT Chest recommended if Age 8-80 years, 20 pack-year currently smoking OR have quit w/in 15years.) does not qualify.   Lung Cancer Screening Referral: na  Additional Screening:  Hepatitis C  Screening: does not qualify; Completed   Vision Screening: Recommended annual ophthalmology exams for early detection of glaucoma and other disorders of the eye. Is the patient up to date with their annual eye exam?  Yes  Who is the provider or what is the name of the office in which the patient attends annual eye exams? [facility] If pt is not established with a provider, would they like to be referred to a provider to establish care? No .   Dental Screening: Recommended annual dental exams for proper oral hygiene  Diabetic Foot Exam: na  Community Resource Referral / Chronic Care Management: CRR required this visit?  No   CCM required this visit?  No     Plan:     I have personally reviewed and noted the following in the patient's chart:   Medical and social history Use of alcohol, tobacco or illicit drugs  Current medications and supplements including opioid prescriptions. Patient is currently taking opioid prescriptions. Information provided to patient regarding non-opioid alternatives. Patient advised to discuss non-opioid treatment plan with their provider. Functional ability and status Nutritional status Physical activity Advanced directives List of other physicians Hospitalizations, surgeries, and ER visits in previous 12 months Vitals Screenings to include cognitive, depression, and falls Referrals and appointments  In addition, I have reviewed and discussed with patient certain preventive protocols, quality metrics, and best practice recommendations. A written personalized care plan for preventive services as well as general preventive health recommendations were provided to patient.     Jordan Hawks Addalyn Speedy, CMA   09/24/2023   After Visit Summary: (MyChart) Due to this being a telephonic visit, the after visit summary with patients personalized plan was offered to patient via MyChart   Nurse Notes: see routing comment

## 2023-09-28 ENCOUNTER — Encounter: Payer: Self-pay | Admitting: Family Medicine

## 2023-10-04 DIAGNOSIS — M810 Age-related osteoporosis without current pathological fracture: Secondary | ICD-10-CM | POA: Diagnosis not present

## 2023-10-04 DIAGNOSIS — E210 Primary hyperparathyroidism: Secondary | ICD-10-CM | POA: Diagnosis not present

## 2023-10-04 DIAGNOSIS — E042 Nontoxic multinodular goiter: Secondary | ICD-10-CM | POA: Diagnosis not present

## 2023-10-04 DIAGNOSIS — F419 Anxiety disorder, unspecified: Secondary | ICD-10-CM | POA: Diagnosis not present

## 2023-10-04 DIAGNOSIS — R748 Abnormal levels of other serum enzymes: Secondary | ICD-10-CM | POA: Diagnosis not present

## 2023-10-04 DIAGNOSIS — E782 Mixed hyperlipidemia: Secondary | ICD-10-CM | POA: Diagnosis not present

## 2023-10-05 LAB — T4, FREE: Free T4: 0.67 ng/dL — ABNORMAL LOW (ref 0.82–1.77)

## 2023-10-05 LAB — COMPREHENSIVE METABOLIC PANEL
ALT: 216 [IU]/L — ABNORMAL HIGH (ref 0–32)
AST: 155 [IU]/L — ABNORMAL HIGH (ref 0–40)
Albumin: 5.1 g/dL — ABNORMAL HIGH (ref 3.9–4.9)
Alkaline Phosphatase: 182 [IU]/L — ABNORMAL HIGH (ref 44–121)
BUN/Creatinine Ratio: 27 (ref 12–28)
BUN: 28 mg/dL — ABNORMAL HIGH (ref 8–27)
Bilirubin Total: <0.2 mg/dL (ref 0.0–1.2)
CO2: 24 mmol/L (ref 20–29)
Calcium: 10.2 mg/dL (ref 8.7–10.3)
Chloride: 103 mmol/L (ref 96–106)
Creatinine, Ser: 1.05 mg/dL — ABNORMAL HIGH (ref 0.57–1.00)
Globulin, Total: 2 g/dL (ref 1.5–4.5)
Glucose: 82 mg/dL (ref 70–99)
Potassium: 5.4 mmol/L — ABNORMAL HIGH (ref 3.5–5.2)
Sodium: 144 mmol/L (ref 134–144)
Total Protein: 7.1 g/dL (ref 6.0–8.5)
eGFR: 60 mL/min/{1.73_m2} (ref >59)

## 2023-10-05 LAB — TSH: TSH: 0.628 u[IU]/mL (ref 0.450–4.500)

## 2023-10-06 ENCOUNTER — Ambulatory Visit (INDEPENDENT_AMBULATORY_CARE_PROVIDER_SITE_OTHER): Payer: Medicare HMO | Admitting: Family Medicine

## 2023-10-06 VITALS — BP 124/82 | HR 92 | Temp 97.0°F | Ht 64.0 in | Wt 145.0 lb

## 2023-10-06 DIAGNOSIS — I10 Essential (primary) hypertension: Secondary | ICD-10-CM | POA: Diagnosis not present

## 2023-10-06 DIAGNOSIS — R7401 Elevation of levels of liver transaminase levels: Secondary | ICD-10-CM | POA: Diagnosis not present

## 2023-10-06 DIAGNOSIS — K449 Diaphragmatic hernia without obstruction or gangrene: Secondary | ICD-10-CM

## 2023-10-06 DIAGNOSIS — E7849 Other hyperlipidemia: Secondary | ICD-10-CM

## 2023-10-06 DIAGNOSIS — M810 Age-related osteoporosis without current pathological fracture: Secondary | ICD-10-CM | POA: Diagnosis not present

## 2023-10-06 DIAGNOSIS — R748 Abnormal levels of other serum enzymes: Secondary | ICD-10-CM | POA: Diagnosis not present

## 2023-10-06 DIAGNOSIS — Z79891 Long term (current) use of opiate analgesic: Secondary | ICD-10-CM

## 2023-10-06 MED ORDER — OXYCODONE HCL 10 MG PO TABS: ORAL_TABLET | ORAL | 0 refills | Status: DC

## 2023-10-06 NOTE — Progress Notes (Signed)
 Subjective:    Patient ID: Cheryl Rowe, female    DOB: 10-Jan-1961, 63 y.o.   MRN: [id number]  Discussed the use of AI scribe software for clinical note transcription with the patient, who gave verbal consent to proceed.  History of Present Illness   Cheryl Rowe is a 63 year old female with a hiatal hernia who presents with gastrointestinal symptoms and elevated liver enzymes.  She experiences significant gastrointestinal symptoms related to her hiatal hernia, including nocturnal belching and regurgitation. Large meals, such as a double cheeseburger, exacerbate her symptoms, causing nausea and discomfort. She has not consulted a general surgeon recently and is dissatisfied with a previous careers adviser from [employer] in [place].  She has elevated liver enzymes, which have been a concern. Her last visit to a gastroenterologist was in November of the previous year, where a procedure was planned but canceled. She is currently taking oxycodone -acetaminophen  for pain, approximately four times a day, and is attempting to reduce this to three times a day. She acknowledges that acetaminophen  can strain the liver and is trying to avoid it. She has stopped taking Crestor  for cholesterol due to concerns about liver health.  Chronic pain is managed with oxycodone -acetaminophen , and she is attempting to reduce acetaminophen  due to liver concerns. She is gradually tapering oxycodone  to reduce overall opioid use.  Her potassium levels are elevated at 5.4, and she attributes dietary changes, including increased sweets consumption, as a possible cause.  She has a history of severe osteoporosis and has decided against further treatments like infusions due to her liver condition.  Socially, she is in the process of moving out of her parents' home.. She mentions that staying home worsens her mood, and she finds relief in going out or using her treadmill.     This patient was seen today for chronic  pain  The medication list was reviewed and updated.   Location of Pain for which the patient has been treated with regarding narcotics: Mid back lower back  Onset of this pain: Present for years   -Compliance with medication: Good compliance  - Number patient states they take daily: Typically 4/day  -Reason for ongoing use of opioids not able to get adequate relief with Tylenol  NSAIDs  What other measures have been tried outside of opioids surgery, Tylenol  NSAIDs  In the ongoing specialists regarding this condition previously was seen back specialist no longer seeing them  -when was the last dose patient took?  Yesterday  The patient was advised the importance of maintaining medication and not using illegal substances with these.  Here for refills and follow up  The patient was educated that we can provide 3 monthly scripts for their medication, it is their responsibility to follow the instructions.  Side effects or complications from medications: Denies side effects  Patient is aware that pain medications are meant to minimize the severity of the pain to allow their pain levels to improve to allow for better function. They are aware of that pain medications cannot totally remove their pain.  Due for UDT ( at least once per year) (pain management contract is also completed at the time of the UDT): Will be due on next visit  Scale of 1 to 10 ( 1 is least 10 is most) Your pain level without the medicine: 9 Your pain level with medication pain 5  Scale 1 to 10 ( 1-helps very little, 10 helps very well) How well does your pain medication reduce your  pain so you can function better through out the day? 7-8  Quality of the pain: Throbbing aching  Persistence of the pain: Persistent all the time  Modifying factors: Worse with activity         Review of Systems     Objective:    Physical Exam     General-in no acute distress Eyes-no discharge Lungs-respiratory rate  normal, CTA CV-no murmurs,RRR Extremities skin warm dry no edema Neuro grossly normal Behavior normal, alert           Assessment & Plan:  Assessment and Plan    Hiatal Hernia Symptoms of regurgitation and discomfort with larger meals. Patient has seen a surgeon previously but was not satisfied with the interaction. -Refer to a different surgeon for evaluation and potential surgical management. -Advise patient to continue with small, frequent meals to minimize symptoms.  Elevated Liver Enzymes Liver enzymes remain elevated. Patient is currently taking acetaminophen -containing pain medication. -Discontinue Crestor  and acetaminophen -containing pain medication. -Order liver ultrasound and additional liver function tests. -Refer back to gastroenterology for further evaluation, potentially including liver biopsy.  Chronic Pain Management Patient is currently taking oxycodone -acetaminophen  for pain management and is open to reducing dosage. -Switch to plain oxycodone  to reduce liver strain. -Gradually reduce dosage over the next few months, aiming for three tablets per day.  Osteoporosis Patient has severe osteoporosis but is not interested in further treatment. -Respect patient's decision to discontinue treatment.  Follow-up -Return in three months for follow-up. -Contact patient with results of labs and ultrasound. -Coordinate with gastroenterology for further evaluation of liver function.        1. Hiatal hernia (Primary) Will try to get her back in with general surgery for further evaluation and possible surgical correction of the hiatal hernia - Ambulatory referral to Gastroenterology  2. Elevated transaminase level Significant elevation of liver enzymes need further workup avoid all acetaminophen  consultation with gastroenterology - Ambulatory referral to Gastroenterology - Hepatic Function Panel - Ceruloplasmin - Anti-smooth muscle antibody, IgG - Iron, TIBC and  Ferritin Panel - Acute Hep Panel & Hep B Surface Ab - FIB-4 - US  ELASTOGRAPHY LIVER  3. Primary hypertension Blood pressure decent control continue current measures  4. Osteoporosis without current pathological fracture, unspecified osteoporosis type Patient relates that she does not want to be on further osteoporosis treatment will monitor periodically obviously if her situation worsens she will need to reconsider  5. Encounter for long-term opiate analgesic use The patient was seen in followup for chronic pain. A review over at their current pain status was discussed. Drug registry was checked. Prescriptions were given.  Regular follow-up recommended. Discussion was held regarding the importance of compliance with medication as well as pain medication contract.  Patient was informed that medication may cause drowsiness and should not be combined  with other medications/alcohol or street drugs. If the patient feels medication is causing altered alertness then do not drive or operate dangerous equipment.  Should be noted that the patient appears to be meeting appropriate use of opioids and response.  Evidenced by improved function and decent pain control without significant side effects and no evidence of overt aberrancy issues.  Upon discussion with the patient today they understand that opioid therapy is optional and they feel that the pain has been refractory to reasonable conservative measures and is significant and affecting quality of life enough to warrant ongoing therapy and wishes to continue opioids.  Refills were provided.  [place]  medical Board guidelines regarding the pain  medicine has been reviewed.  CDC guidelines most updated 2022 has been reviewed by the prescriber.  PDMP is checked on a regular basis yearly urine drug screen and pain management contract  We will switch over to plain oxycodone  will maintain current dosing for this coming month and then gradually taper  down to 3/day  6. Elevated liver enzymes Consultation with gastroenterology Ultrasound to rule out fibrosis May need liver biopsy - Ambulatory referral to Gastroenterology - Hepatic Function Panel - Ceruloplasmin - Anti-smooth muscle antibody, IgG - Iron, TIBC and Ferritin Panel - Acute Hep Panel & Hep B Surface Ab - FIB-4 - US  ELASTOGRAPHY LIVER  7. Other hyperlipidemia Healthy diet regular activity

## 2023-10-10 ENCOUNTER — Encounter: Payer: Self-pay | Admitting: Family Medicine

## 2023-10-10 MED ORDER — OXYCODONE HCL 10 MG PO TABS: ORAL_TABLET | ORAL | 0 refills | Status: DC

## 2023-10-14 ENCOUNTER — Encounter (INDEPENDENT_AMBULATORY_CARE_PROVIDER_SITE_OTHER): Payer: Self-pay

## 2023-10-14 ENCOUNTER — Ambulatory Visit (HOSPITAL_COMMUNITY)
Admission: RE | Admit: 2023-10-14 | Discharge: 2023-10-14 | Disposition: A | Discharge status: Home / Self Care | Payer: Medicare HMO | Source: Ambulatory Visit | Attending: Family Medicine | Admitting: Family Medicine

## 2023-10-14 ENCOUNTER — Encounter (INDEPENDENT_AMBULATORY_CARE_PROVIDER_SITE_OTHER): Payer: Self-pay | Admitting: *Deleted

## 2023-10-14 DIAGNOSIS — R7401 Elevation of levels of liver transaminase levels: Secondary | ICD-10-CM | POA: Diagnosis not present

## 2023-10-14 DIAGNOSIS — R748 Abnormal levels of other serum enzymes: Secondary | ICD-10-CM | POA: Insufficient documentation

## 2023-10-14 DIAGNOSIS — Z9049 Acquired absence of other specified parts of digestive tract: Secondary | ICD-10-CM | POA: Diagnosis not present

## 2023-10-14 DIAGNOSIS — R7989 Other specified abnormal findings of blood chemistry: Secondary | ICD-10-CM | POA: Diagnosis not present

## 2023-10-14 DIAGNOSIS — K838 Other specified diseases of biliary tract: Secondary | ICD-10-CM | POA: Diagnosis not present

## 2023-10-15 ENCOUNTER — Encounter: Payer: Self-pay | Admitting: Family Medicine

## 2023-10-15 ENCOUNTER — Telehealth: Payer: Self-pay | Admitting: Family Medicine

## 2023-10-15 NOTE — Telephone Encounter (Signed)
Nurses I did communicate with Dr. Levon Hedger regarding the patient's ultrasound findings Due to dilated common bile duct and elevated liver enzymes it is recommended to do an MRCP. Please let the patient know that gastroenterology recommends further imaging to look at the common bile duct within the liver Typically we use MRI to do a MRCP Please find out from the patient with her previous spinal surgery do they allow her to do MRI?

## 2023-10-19 NOTE — Telephone Encounter (Signed)
Spoke with patient regarding MRCP recommendations and she states as far as she knows she is ok to have MRI , only concern would be laying flat, but she says she will do her best and would like to proceed with recommendations. She states she will be having labs done Friday and will be looking forward to having those results back .

## 2023-10-19 NOTE — Telephone Encounter (Signed)
Due to elevated liver enzymes and dilated common bile duct/abnormal ultrasound the radiologist recommends MRCP of the common bile duct.  Please set up this MRI-ideally this needs to be done relatively urgently somewhere in the next 10 days preferably 2 weeks at the most thank you  Also let Cheryl Rowe know that she needs to communicate to the MRI individuals about her previous back surgery and how it is difficult to lay on her back but they will try to do the best they can at positioning her and making the test as short as possible but this test is necessary to see what is going on thank you

## 2023-10-19 NOTE — Telephone Encounter (Signed)
Called and left a message for the patient regarding doctor's message about gastroenterology recommendations for MRI of the bile ducts within the liver.

## 2023-10-20 ENCOUNTER — Other Ambulatory Visit: Payer: Self-pay

## 2023-10-20 DIAGNOSIS — R935 Abnormal findings on diagnostic imaging of other abdominal regions, including retroperitoneum: Secondary | ICD-10-CM

## 2023-10-20 DIAGNOSIS — R748 Abnormal levels of other serum enzymes: Secondary | ICD-10-CM

## 2023-10-20 NOTE — Telephone Encounter (Signed)
Orders have been placed, tried to call pt, no answer

## 2023-10-27 ENCOUNTER — Other Ambulatory Visit: Payer: Self-pay | Admitting: Family Medicine

## 2023-10-27 ENCOUNTER — Encounter: Payer: Self-pay | Admitting: Family Medicine

## 2023-10-27 ENCOUNTER — Ambulatory Visit (HOSPITAL_COMMUNITY)
Admission: RE | Admit: 2023-10-27 | Discharge: 2023-10-27 | Disposition: A | Discharge status: Home / Self Care | Payer: Medicare HMO | Source: Ambulatory Visit | Attending: Family Medicine | Admitting: Family Medicine

## 2023-10-27 DIAGNOSIS — K449 Diaphragmatic hernia without obstruction or gangrene: Secondary | ICD-10-CM | POA: Diagnosis not present

## 2023-10-27 DIAGNOSIS — R748 Abnormal levels of other serum enzymes: Secondary | ICD-10-CM | POA: Diagnosis not present

## 2023-10-27 DIAGNOSIS — R109 Unspecified abdominal pain: Secondary | ICD-10-CM | POA: Diagnosis not present

## 2023-10-27 DIAGNOSIS — R935 Abnormal findings on diagnostic imaging of other abdominal regions, including retroperitoneum: Secondary | ICD-10-CM | POA: Insufficient documentation

## 2023-10-27 DIAGNOSIS — I700 Atherosclerosis of aorta: Secondary | ICD-10-CM | POA: Diagnosis not present

## 2023-10-27 MED ORDER — GADOBUTROL 1 MMOL/ML IV SOLN
7.0000 mL | Freq: Once | INTRAVENOUS | Status: AC | PRN
  Administered 2023-10-27: 7 mL via INTRAVENOUS

## 2023-10-28 ENCOUNTER — Other Ambulatory Visit: Payer: Self-pay | Admitting: Family Medicine

## 2023-10-31 ENCOUNTER — Encounter: Payer: Self-pay | Admitting: Family Medicine

## 2023-11-01 ENCOUNTER — Other Ambulatory Visit: Payer: Self-pay | Admitting: Family Medicine

## 2023-11-08 ENCOUNTER — Encounter (HOSPITAL_COMMUNITY): Payer: Self-pay

## 2023-11-08 ENCOUNTER — Ambulatory Visit (HOSPITAL_COMMUNITY)
Admission: RE | Admit: 2023-11-08 | Discharge: 2023-11-08 | Disposition: A | Discharge status: Home / Self Care | Payer: Medicare HMO | Source: Ambulatory Visit | Attending: Family Medicine | Admitting: Family Medicine

## 2023-11-08 DIAGNOSIS — Z1231 Encounter for screening mammogram for malignant neoplasm of breast: Secondary | ICD-10-CM | POA: Diagnosis not present

## 2023-11-08 DIAGNOSIS — Z0000 Encounter for general adult medical examination without abnormal findings: Secondary | ICD-10-CM | POA: Diagnosis not present

## 2023-11-16 DIAGNOSIS — R7401 Elevation of levels of liver transaminase levels: Secondary | ICD-10-CM | POA: Diagnosis not present

## 2023-11-16 DIAGNOSIS — R748 Abnormal levels of other serum enzymes: Secondary | ICD-10-CM | POA: Diagnosis not present

## 2023-11-17 ENCOUNTER — Ambulatory Visit (INDEPENDENT_AMBULATORY_CARE_PROVIDER_SITE_OTHER): Payer: Medicare HMO | Admitting: Gastroenterology

## 2023-11-17 ENCOUNTER — Encounter: Payer: Self-pay | Admitting: Family Medicine

## 2023-11-17 ENCOUNTER — Encounter (INDEPENDENT_AMBULATORY_CARE_PROVIDER_SITE_OTHER): Payer: Self-pay | Admitting: Gastroenterology

## 2023-11-17 VITALS — BP 105/69 | HR 89 | Temp 98.2°F | Ht 64.0 in | Wt 140.6 lb

## 2023-11-17 DIAGNOSIS — R7989 Other specified abnormal findings of blood chemistry: Secondary | ICD-10-CM | POA: Diagnosis not present

## 2023-11-17 DIAGNOSIS — K838 Other specified diseases of biliary tract: Secondary | ICD-10-CM | POA: Insufficient documentation

## 2023-11-17 DIAGNOSIS — R748 Abnormal levels of other serum enzymes: Secondary | ICD-10-CM

## 2023-11-17 DIAGNOSIS — R1112 Projectile vomiting: Secondary | ICD-10-CM

## 2023-11-17 DIAGNOSIS — K449 Diaphragmatic hernia without obstruction or gangrene: Secondary | ICD-10-CM

## 2023-11-17 DIAGNOSIS — Z1211 Encounter for screening for malignant neoplasm of colon: Secondary | ICD-10-CM | POA: Insufficient documentation

## 2023-11-17 DIAGNOSIS — R1013 Epigastric pain: Secondary | ICD-10-CM

## 2023-11-17 LAB — HEPATIC FUNCTION PANEL
ALT: 138 IU/L — ABNORMAL HIGH (ref 0–32)
AST: 104 IU/L — ABNORMAL HIGH (ref 0–40)
Albumin: 5.1 g/dL — ABNORMAL HIGH (ref 3.9–4.9)
Alkaline Phosphatase: 160 IU/L — ABNORMAL HIGH (ref 44–121)
Bilirubin Total: 0.3 mg/dL (ref 0.0–1.2)
Bilirubin, Direct: 0.1 mg/dL (ref 0.00–0.40)
Total Protein: 7.5 g/dL (ref 6.0–8.5)

## 2023-11-17 LAB — ACUTE HEP PANEL AND HEP B SURFACE AB
Hep A IgM: NEGATIVE
Hep B C IgM: NEGATIVE
Hep C Virus Ab: NONREACTIVE
Hepatitis B Surf Ab Quant: 8.2 m[IU]/mL — ABNORMAL LOW
Hepatitis B Surface Ag: NEGATIVE

## 2023-11-17 LAB — IRON,TIBC AND FERRITIN PANEL
Ferritin: 102 ng/mL (ref 15–150)
Iron Saturation: 18 % (ref 15–55)
Iron: 63 ug/dL (ref 27–139)
Total Iron Binding Capacity: 343 ug/dL (ref 250–450)
UIBC: 280 ug/dL (ref 118–369)

## 2023-11-17 LAB — FIB-4
FIB-4 Index: 1.5 (ref 0.00–2.67)
Platelets: 361 10*3/uL (ref 150–450)

## 2023-11-17 LAB — ANTI-SMOOTH MUSCLE ANTIBODY, IGG: Smooth Muscle Ab: 15 U (ref 0–19)

## 2023-11-17 LAB — CERULOPLASMIN: Ceruloplasmin: 29.7 mg/dL (ref 19.0–39.0)

## 2023-11-17 NOTE — Progress Notes (Signed)
 Vista Lawman , M.D. Gastroenterology & Hepatology [facility] [address] [place], [place] [address] Primary Care Physician: Babs Sciara, MD [address] [place] [address]  Referring MD: Dr Michaell Cowing   Chief Complaint:  Vomiting Adalberto Ill pain , Large hiatal hernia , Elevated liver enzymes with biliary duct dilation   History of Present Illness:  Cheryl Rowe is a 63 y.o. female who presents for evaluation of Vomiting /Epigastric pain , Large hiatal hernia , Elevated liver enzymes with biliary duct dilation   Patient was seen last by myself  03/2023 and is here for follow-up and with new complaints.  She was scheduled for upper endoscopy last year but had to cancel because she was going through parathyroidectomy for hypercalcemia  Patient's report since last year patient has episodes of vomiting at least once a week always happens while she is sleeping she will wake up in the middle of the night with multiple episodes of nausea and vomiting nonbilious nonbloody.  This has led her to go to the ER and they found a moderate to large hiatal hernia.  She was seen by surgery and as preprocedure workup she underwent manometry at [facility], although I cannot see the report.  Patient never had an upper endoscopy.  During the same time patient has abdominal pain located in the epigastric area and she describes it as a dull ache.  Denies any melena or hematochezia.  Patient denies dysphagia is able to eat all her meals.  Denies any regurgitation.  She has been on antisecretory medications such as omeprazole and pantoprazole for a long time and that is due to underlying heartburn. The patient denies having any  fever, chills, hematochezia, melena, hematemesis, abdominal distention,  jaundice, pruritus or unintentional weight loss.  Never had EGD   Colonoscopy 2019 :- One small polyp at the hepatic flexure, removed with a cold snare.  Resected and retrieved. - Diverticulosis in the sigmoid colon. - External hemorrhoids.  TA Due : 2026  FHx: neg for any gastrointestinal/liver disease, no malignancies Social: neg smoking, alcohol or illicit drug use Surgical: Cholecystectomy   Past Medical History: Past Medical History:  Diagnosis Date   ADHD (attention deficit hyperactivity disorder)    Allergy    Anxiety    Arthritis    Depression    Family history of adverse reaction to anesthesia    sister has PONV   GERD (gastroesophageal reflux disease)    Heart murmur    HTN (hypertension) 07/10/2021   Hyperlipidemia    Migraine    Osteoporosis    Scoliosis    Teenage   Spondylolisthesis of lumbosacral region    Wears glasses     Past Surgical History: Past Surgical History:  Procedure Laterality Date   BACK SURGERY     rods   BIOPSY THYROID  03/2023   CERVICAL ABLATION     CHOLECYSTECTOMY     COLONOSCOPY WITH PROPOFOL N/A 03/25/2018   Procedure: COLONOSCOPY WITH PROPOFOL;  Surgeon: Malissa Hippo, MD;  [facility];  Service: Endoscopy;  Laterality: N/A;  9:30   PARATHYROIDECTOMY N/A 03/22/2023   Procedure: NECK EXPLORATION WITH PARATHYROIDECTOMY;  Surgeon: Darnell Level, MD;  Location: [facility];  Service: General;  Laterality: N/A;   POLYPECTOMY  03/25/2018   Procedure: POLYPECTOMY;  Surgeon: Malissa Hippo, MD;  [facility]: [facility];  Service: Endoscopy;;  hepatic flexure   SPINE SURGERY  2018   TONSILLECTOMY  Family History: Family History  Problem Relation Age of Onset   Hypertension Father    Arthritis Father    Arthritis Mother     Social History: Social History   Tobacco Use  Smoking Status Never  Smokeless Tobacco Never   Social History   Substance and Sexual Activity  Alcohol Use No   Social History   Substance and Sexual Activity  Drug Use No    Allergies: Allergies  Allergen Reactions   Pravastatin     Legs ache   Cymbalta [Duloxetine Hcl] Other  (See Comments)    Aggressive     Medications: Current Outpatient Medications  Medication Sig Dispense Refill   amLODipine (NORVASC) 10 MG tablet TAKE 1 TABLET BY MOUTH EVERY DAY 90 tablet 1   aspirin-acetaminophen-caffeine (EXCEDRIN MIGRAINE) 250-250-65 MG tablet Take 2 tablets by mouth daily as needed for headache.     diphenhydrAMINE (BENADRYL) 25 MG tablet Take 25 mg by mouth at bedtime as needed for sleep.     Flaxseed, Linseed, (FLAX SEED OIL) 1000 MG CAPS Take 1,000 mg by mouth daily.     Multiple Vitamin (MULTIVITAMIN WITH MINERALS) TABS tablet Take 1 tablet by mouth daily.     ondansetron (ZOFRAN-ODT) 4 MG disintegrating tablet Take 1 tablet (4 mg total) by mouth every 8 (eight) hours as needed for nausea. 10 tablet 0   Oxycodone HCl 10 MG TABS 1 4 times daily as needed pain (Patient taking differently: 1 three times daily as needed pain) 120 tablet 0   pantoprazole (PROTONIX) 40 MG tablet TAKE 1 TABLET BY MOUTH EVERY DAY 90 tablet 1   Polyethyl Glycol-Propyl Glycol (LUBRICATING EYE DROPS OP) Place 1 drop into both eyes daily as needed (dry eyes).     valsartan-hydrochlorothiazide (DIOVAN-HCT) 160-12.5 MG tablet TAKE 1 TABLET BY MOUTH EVERY DAY 90 tablet 3   venlafaxine XR (EFFEXOR-XR) 75 MG 24 hr capsule TAKE 3 CAPSULES BY MOUTH EVERY DAY 270 capsule 2   No current facility-administered medications for this visit.    Review of Systems: GENERAL: negative for malaise, night sweats HEENT: No changes in hearing or vision, no nose bleeds or other nasal problems. NECK: Negative for lumps, goiter, pain and significant neck swelling RESPIRATORY: Negative for cough, wheezing CARDIOVASCULAR: Negative for chest pain, leg swelling, palpitations, orthopnea GI: SEE HPI MUSCULOSKELETAL: Negative for joint pain or swelling, back pain, and muscle pain. SKIN: Negative for lesions, rash PSYCH: Negative for sleep disturbance, mood disorder and recent psychosocial stressors. HEMATOLOGY Negative  for prolonged bleeding, bruising easily, and swollen nodes. ENDOCRINE: Negative for cold or heat intolerance, polyuria, polydipsia and goiter. NEURO: negative for tremor, gait imbalance, syncope and seizures. The remainder of the review of systems is noncontributory.   Physical Exam: BP 105/69 (BP Location: Left Arm, Patient Position: Sitting, Cuff Size: Normal)   Pulse 89   Temp 98.2 F (36.8 C) (Temporal)   Ht 5\' 4"  (1.626 m)   Wt 140 lb 9.6 oz (63.8 kg)   BMI 24.13 kg/m  GENERAL: The patient is AO x3, in no acute distress. HEENT: Head is normocephalic and atraumatic. EOMI are intact. Mouth is well hydrated and without lesions. NECK: Supple. No masses LUNGS: Clear to auscultation. No presence of rhonchi/wheezing/rales. Adequate chest expansion HEART: RRR, normal s1 and s2. ABDOMEN: Soft, nontender, no guarding, no peritoneal signs, and nondistended. BS +. No masses. EXTREMITIES: Without any cyanosis, clubbing, rash, lesions or edema. NEUROLOGIC: AOx3, no focal motor deficit. SKIN: no jaundice, no rashes  Imaging/Labs: as above  I personally reviewed and interpreted the available labs, imaging and endoscopic files.    Elastrography US  IMPRESSION: 1. Increased hepatic echotexture, most commonly seen with steatosis. Correlation with LFT's is recommended.   2. Surgically absent gallbladder. Dilated common bile duct measuring up to 1.6 cm. No sonographic evidence of choledocholithiasis. Recommend MRCP or ERCP for further evaluation as clinically indicated.   ULTRASOUND HEPATIC ELASTOGRAPHY:   Median kPa: 2.0   Diagnostic category:  < or = 5 kPa: high probability of being normal   Diagnostic Categories:   < or =5 kPa: high probability of being normal   < or =9 kPa: in the absence of other known clinical signs, rules out cACLD   >9 kPa and ?13 kPa: suggestive of cACLD, but needs further testing   >13 kPa: highly suggestive of cACLD   > or =17 kPa: highly  suggestive of cACLD with an increased probability of clinically significant portal hypertension    MRI Abdomen and Pelvis  IMPRESSION: 1. Intra and extrahepatic biliary ductal dilatation, common bile duct measuring up to 1.7 cm, similar to both prior ultrasound and CT examinations. Common bile duct tapers smoothly to the ampulla without evidence of calculus or other obstruction. This is most likely benign postoperative biliary ductal dilatation. ERCP can be considered for further evaluation if indicated by clinical evidence of ductal obstruction. 2. No acute findings in the abdomen. 3. Moderate hiatal hernia with intrathoracic position of the gastric fundus.   Had a IgM negative, hep B surface antigen core antibody negative hep B surface antibody negative hep C negative  Impression and Plan:  CHEREESE CILENTO is a 63 y.o. female who presents for evaluation of Vomiting /Epigastric pain , Large hiatal hernia , Elevated liver enzymes with biliary duct dilation   #Abdominal pain #Vomiting  #Elevated liver enzymes #Biliary duct dilation  Patient has new liver enzyme elevation with CBD and intra and extrahepatic biliary duct dilation.  Even though her MRCP did not demonstrate any choledocholithiasis I have a strong suspicion that there might be stone in bile duct which may explain most of patient's symptoms with abdominal discomfort nausea, liver enzyme elevation and ultrasound or MRCP finding biliary duct dilation  Hep A, B, and C are negative .  Patient is also on over-the-counter supplements and I advised her to stop all supplements at this time as it can implicate DILI (drug-induced liver injury)  Patient has episodes of vomiting , this may be due to underlying hiatal hernia or GERD and given new early satiety upper endoscopy is indicated  She was seen by surgery and underwent manometry at [facility].  I do not see the actual report in Care Everywhere but will obtain records.As  pre-procedure workup EGD is indicated to rule out any structural defect or any other lesion  Recommended holding PPI for atleast 10 days prior to the procedure to evaluate for GERD defining lesion such as LA Class C/D esophagitis and also EGD in this case can serve as screening for Barrets for long standing GERD.   Will refer the patient for ERCP and EUS as well  #HCM  Patient had a high quality colonoscopy in 2019 with diminutive TA, Next colonoscopy due 2026  All questions were answered.      Vista Lawman  MD Gastroenterology and Hepatology [facility]

## 2023-11-17 NOTE — Patient Instructions (Signed)
 It was very nice to meet you today, as dicussed with will plan for the following :  1) Labs  2)upper endoscopy 3) Will refer you for EUS/ERCP

## 2023-11-17 NOTE — H&P (View-Only) (Signed)
 Vista Lawman , M.D. Gastroenterology & Hepatology [facility] [address] [place], [place] [address] Primary Care Physician: Babs Sciara, MD [address] [place] [address]  Referring MD: Dr Michaell Cowing   Chief Complaint:  Vomiting Adalberto Ill pain , Large hiatal hernia , Elevated liver enzymes with biliary duct dilation   History of Present Illness:  Cheryl Rowe is a 63 y.o. female who presents for evaluation of Vomiting /Epigastric pain , Large hiatal hernia , Elevated liver enzymes with biliary duct dilation   Patient was seen last by myself  03/2023 and is here for follow-up and with new complaints.  She was scheduled for upper endoscopy last year but had to cancel because she was going through parathyroidectomy for hypercalcemia  Patient's report since last year patient has episodes of vomiting at least once a week always happens while she is sleeping she will wake up in the middle of the night with multiple episodes of nausea and vomiting nonbilious nonbloody.  This has led her to go to the ER and they found a moderate to large hiatal hernia.  She was seen by surgery and as preprocedure workup she underwent manometry at [facility], although I cannot see the report.  Patient never had an upper endoscopy.  During the same time patient has abdominal pain located in the epigastric area and she describes it as a dull ache.  Denies any melena or hematochezia.  Patient denies dysphagia is able to eat all her meals.  Denies any regurgitation.  She has been on antisecretory medications such as omeprazole and pantoprazole for a long time and that is due to underlying heartburn. The patient denies having any  fever, chills, hematochezia, melena, hematemesis, abdominal distention,  jaundice, pruritus or unintentional weight loss.  Never had EGD   Colonoscopy 2019 :- One small polyp at the hepatic flexure, removed with a cold snare.  Resected and retrieved. - Diverticulosis in the sigmoid colon. - External hemorrhoids.  TA Due : 2026  FHx: neg for any gastrointestinal/liver disease, no malignancies Social: neg smoking, alcohol or illicit drug use Surgical: Cholecystectomy   Past Medical History: Past Medical History:  Diagnosis Date   ADHD (attention deficit hyperactivity disorder)    Allergy    Anxiety    Arthritis    Depression    Family history of adverse reaction to anesthesia    sister has PONV   GERD (gastroesophageal reflux disease)    Heart murmur    HTN (hypertension) 07/10/2021   Hyperlipidemia    Migraine    Osteoporosis    Scoliosis    Teenage   Spondylolisthesis of lumbosacral region    Wears glasses     Past Surgical History: Past Surgical History:  Procedure Laterality Date   BACK SURGERY     rods   BIOPSY THYROID  03/2023   CERVICAL ABLATION     CHOLECYSTECTOMY     COLONOSCOPY WITH PROPOFOL N/A 03/25/2018   Procedure: COLONOSCOPY WITH PROPOFOL;  Surgeon: Malissa Hippo, MD;  [facility]: [facility];  Service: Endoscopy;  Laterality: N/A;  9:30   PARATHYROIDECTOMY N/A 03/22/2023   Procedure: NECK EXPLORATION WITH PARATHYROIDECTOMY;  Surgeon: Darnell Level, MD;  Location: [facility];  Service: General;  Laterality: N/A;   POLYPECTOMY  03/25/2018   Procedure: POLYPECTOMY;  Surgeon: Malissa Hippo, MD;  [facility];  Service: Endoscopy;;  hepatic flexure   SPINE SURGERY  2018   TONSILLECTOMY  Family History: Family History  Problem Relation Age of Onset   Hypertension Father    Arthritis Father    Arthritis Mother     Social History: Social History   Tobacco Use  Smoking Status Never  Smokeless Tobacco Never   Social History   Substance and Sexual Activity  Alcohol Use No   Social History   Substance and Sexual Activity  Drug Use No    Allergies: Allergies  Allergen Reactions   Pravastatin     Legs ache   Cymbalta [Duloxetine Hcl] Other  (See Comments)    Aggressive     Medications: Current Outpatient Medications  Medication Sig Dispense Refill   amLODipine (NORVASC) 10 MG tablet TAKE 1 TABLET BY MOUTH EVERY DAY 90 tablet 1   aspirin-acetaminophen-caffeine (EXCEDRIN MIGRAINE) 250-250-65 MG tablet Take 2 tablets by mouth daily as needed for headache.     diphenhydrAMINE (BENADRYL) 25 MG tablet Take 25 mg by mouth at bedtime as needed for sleep.     Flaxseed, Linseed, (FLAX SEED OIL) 1000 MG CAPS Take 1,000 mg by mouth daily.     Multiple Vitamin (MULTIVITAMIN WITH MINERALS) TABS tablet Take 1 tablet by mouth daily.     ondansetron (ZOFRAN-ODT) 4 MG disintegrating tablet Take 1 tablet (4 mg total) by mouth every 8 (eight) hours as needed for nausea. 10 tablet 0   Oxycodone HCl 10 MG TABS 1 4 times daily as needed pain (Patient taking differently: 1 three times daily as needed pain) 120 tablet 0   pantoprazole (PROTONIX) 40 MG tablet TAKE 1 TABLET BY MOUTH EVERY DAY 90 tablet 1   Polyethyl Glycol-Propyl Glycol (LUBRICATING EYE DROPS OP) Place 1 drop into both eyes daily as needed (dry eyes).     valsartan-hydrochlorothiazide (DIOVAN-HCT) 160-12.5 MG tablet TAKE 1 TABLET BY MOUTH EVERY DAY 90 tablet 3   venlafaxine XR (EFFEXOR-XR) 75 MG 24 hr capsule TAKE 3 CAPSULES BY MOUTH EVERY DAY 270 capsule 2   No current facility-administered medications for this visit.    Review of Systems: GENERAL: negative for malaise, night sweats HEENT: No changes in hearing or vision, no nose bleeds or other nasal problems. NECK: Negative for lumps, goiter, pain and significant neck swelling RESPIRATORY: Negative for cough, wheezing CARDIOVASCULAR: Negative for chest pain, leg swelling, palpitations, orthopnea GI: SEE HPI MUSCULOSKELETAL: Negative for joint pain or swelling, back pain, and muscle pain. SKIN: Negative for lesions, rash PSYCH: Negative for sleep disturbance, mood disorder and recent psychosocial stressors. HEMATOLOGY Negative  for prolonged bleeding, bruising easily, and swollen nodes. ENDOCRINE: Negative for cold or heat intolerance, polyuria, polydipsia and goiter. NEURO: negative for tremor, gait imbalance, syncope and seizures. The remainder of the review of systems is noncontributory.   Physical Exam: BP 105/69 (BP Location: Left Arm, Patient Position: Sitting, Cuff Size: Normal)   Pulse 89   Temp 98.2 F (36.8 C) (Temporal)   Ht 5\' 4"  (1.626 m)   Wt 140 lb 9.6 oz (63.8 kg)   BMI 24.13 kg/m  GENERAL: The patient is AO x3, in no acute distress. HEENT: Head is normocephalic and atraumatic. EOMI are intact. Mouth is well hydrated and without lesions. NECK: Supple. No masses LUNGS: Clear to auscultation. No presence of rhonchi/wheezing/rales. Adequate chest expansion HEART: RRR, normal s1 and s2. ABDOMEN: Soft, nontender, no guarding, no peritoneal signs, and nondistended. BS +. No masses. EXTREMITIES: Without any cyanosis, clubbing, rash, lesions or edema. NEUROLOGIC: AOx3, no focal motor deficit. SKIN: no jaundice, no rashes  Imaging/Labs: as above  I personally reviewed and interpreted the available labs, imaging and endoscopic files.    Elastrography US  IMPRESSION: 1. Increased hepatic echotexture, most commonly seen with steatosis. Correlation with LFT's is recommended.   2. Surgically absent gallbladder. Dilated common bile duct measuring up to 1.6 cm. No sonographic evidence of choledocholithiasis. Recommend MRCP or ERCP for further evaluation as clinically indicated.   ULTRASOUND HEPATIC ELASTOGRAPHY:   Median kPa: 2.0   Diagnostic category:  < or = 5 kPa: high probability of being normal   Diagnostic Categories:   < or =5 kPa: high probability of being normal   < or =9 kPa: in the absence of other known clinical signs, rules out cACLD   >9 kPa and ?13 kPa: suggestive of cACLD, but needs further testing   >13 kPa: highly suggestive of cACLD   > or =17 kPa: highly  suggestive of cACLD with an increased probability of clinically significant portal hypertension    MRI Abdomen and Pelvis  IMPRESSION: 1. Intra and extrahepatic biliary ductal dilatation, common bile duct measuring up to 1.7 cm, similar to both prior ultrasound and CT examinations. Common bile duct tapers smoothly to the ampulla without evidence of calculus or other obstruction. This is most likely benign postoperative biliary ductal dilatation. ERCP can be considered for further evaluation if indicated by clinical evidence of ductal obstruction. 2. No acute findings in the abdomen. 3. Moderate hiatal hernia with intrathoracic position of the gastric fundus.   Had a IgM negative, hep B surface antigen core antibody negative hep B surface antibody negative hep C negative  Impression and Plan:  CHEREESE CILENTO is a 63 y.o. female who presents for evaluation of Vomiting /Epigastric pain , Large hiatal hernia , Elevated liver enzymes with biliary duct dilation   #Abdominal pain #Vomiting  #Elevated liver enzymes #Biliary duct dilation  Patient has new liver enzyme elevation with CBD and intra and extrahepatic biliary duct dilation.  Even though her MRCP did not demonstrate any choledocholithiasis I have a strong suspicion that there might be stone in bile duct which may explain most of patient's symptoms with abdominal discomfort nausea, liver enzyme elevation and ultrasound or MRCP finding biliary duct dilation  Hep A, B, and C are negative .  Patient is also on over-the-counter supplements and I advised her to stop all supplements at this time as it can implicate DILI (drug-induced liver injury)  Patient has episodes of vomiting , this may be due to underlying hiatal hernia or GERD and given new early satiety upper endoscopy is indicated  She was seen by surgery and underwent manometry at [facility].  I do not see the actual report in Care Everywhere but will obtain records.As  pre-procedure workup EGD is indicated to rule out any structural defect or any other lesion  Recommended holding PPI for atleast 10 days prior to the procedure to evaluate for GERD defining lesion such as LA Class C/D esophagitis and also EGD in this case can serve as screening for Barrets for long standing GERD.   Will refer the patient for ERCP and EUS as well  #HCM  Patient had a high quality colonoscopy in 2019 with diminutive TA, Next colonoscopy due 2026  All questions were answered.      Vista Lawman  MD Gastroenterology and Hepatology [facility]

## 2023-11-18 ENCOUNTER — Encounter: Payer: Self-pay | Admitting: Family Medicine

## 2023-11-18 NOTE — Progress Notes (Signed)
 Please mail to the patient

## 2023-11-22 ENCOUNTER — Other Ambulatory Visit (HOSPITAL_COMMUNITY)
Admission: RE | Admit: 2023-11-22 | Discharge: 2023-11-22 | Disposition: A | Discharge status: Home / Self Care | Source: Ambulatory Visit | Attending: Gastroenterology | Admitting: Gastroenterology

## 2023-11-22 DIAGNOSIS — K449 Diaphragmatic hernia without obstruction or gangrene: Secondary | ICD-10-CM | POA: Insufficient documentation

## 2023-11-22 DIAGNOSIS — R1112 Projectile vomiting: Secondary | ICD-10-CM | POA: Diagnosis not present

## 2023-11-22 LAB — BASIC METABOLIC PANEL
Anion gap: 11 (ref 5–15)
BUN: 21 mg/dL (ref 8–23)
CO2: 25 mmol/L (ref 22–32)
Calcium: 9.5 mg/dL (ref 8.9–10.3)
Chloride: 103 mmol/L (ref 98–111)
Creatinine, Ser: 0.97 mg/dL (ref 0.44–1.00)
GFR, Estimated: >60 mL/min (ref >60)
Glucose, Bld: 97 mg/dL (ref 70–99)
Potassium: 3.9 mmol/L (ref 3.5–5.1)
Sodium: 139 mmol/L (ref 135–145)

## 2023-11-24 ENCOUNTER — Encounter (HOSPITAL_COMMUNITY): Payer: Self-pay | Admitting: Gastroenterology

## 2023-11-24 ENCOUNTER — Encounter (HOSPITAL_COMMUNITY)
Admission: RE | Disposition: A | Discharge status: Home / Self Care | Payer: Self-pay | Source: Home / Self Care | Attending: Gastroenterology

## 2023-11-24 ENCOUNTER — Ambulatory Visit (HOSPITAL_COMMUNITY): Admitting: Anesthesiology

## 2023-11-24 ENCOUNTER — Ambulatory Visit (HOSPITAL_COMMUNITY)
Admission: RE | Admit: 2023-11-24 | Discharge: 2023-11-24 | Disposition: A | Discharge status: Home / Self Care | Attending: Gastroenterology | Admitting: Gastroenterology

## 2023-11-24 ENCOUNTER — Other Ambulatory Visit: Payer: Self-pay

## 2023-11-24 DIAGNOSIS — K259 Gastric ulcer, unspecified as acute or chronic, without hemorrhage or perforation: Secondary | ICD-10-CM | POA: Diagnosis not present

## 2023-11-24 DIAGNOSIS — K2970 Gastritis, unspecified, without bleeding: Secondary | ICD-10-CM | POA: Diagnosis not present

## 2023-11-24 DIAGNOSIS — K3189 Other diseases of stomach and duodenum: Secondary | ICD-10-CM | POA: Diagnosis not present

## 2023-11-24 DIAGNOSIS — R011 Cardiac murmur, unspecified: Secondary | ICD-10-CM | POA: Diagnosis not present

## 2023-11-24 DIAGNOSIS — Z8249 Family history of ischemic heart disease and other diseases of the circulatory system: Secondary | ICD-10-CM | POA: Diagnosis not present

## 2023-11-24 DIAGNOSIS — I10 Essential (primary) hypertension: Secondary | ICD-10-CM | POA: Insufficient documentation

## 2023-11-24 DIAGNOSIS — K449 Diaphragmatic hernia without obstruction or gangrene: Secondary | ICD-10-CM

## 2023-11-24 DIAGNOSIS — K838 Other specified diseases of biliary tract: Secondary | ICD-10-CM | POA: Diagnosis not present

## 2023-11-24 DIAGNOSIS — G709 Myoneural disorder, unspecified: Secondary | ICD-10-CM | POA: Diagnosis not present

## 2023-11-24 DIAGNOSIS — K5750 Diverticulosis of both small and large intestine without perforation or abscess without bleeding: Secondary | ICD-10-CM | POA: Insufficient documentation

## 2023-11-24 DIAGNOSIS — K5710 Diverticulosis of small intestine without perforation or abscess without bleeding: Secondary | ICD-10-CM

## 2023-11-24 DIAGNOSIS — K2100 Gastro-esophageal reflux disease with esophagitis, without bleeding: Secondary | ICD-10-CM | POA: Diagnosis not present

## 2023-11-24 DIAGNOSIS — R6881 Early satiety: Secondary | ICD-10-CM | POA: Diagnosis present

## 2023-11-24 DIAGNOSIS — F418 Other specified anxiety disorders: Secondary | ICD-10-CM | POA: Diagnosis not present

## 2023-11-24 DIAGNOSIS — K209 Esophagitis, unspecified without bleeding: Secondary | ICD-10-CM | POA: Diagnosis not present

## 2023-11-24 DIAGNOSIS — K319 Disease of stomach and duodenum, unspecified: Secondary | ICD-10-CM | POA: Diagnosis not present

## 2023-11-24 DIAGNOSIS — F419 Anxiety disorder, unspecified: Secondary | ICD-10-CM | POA: Diagnosis not present

## 2023-11-24 DIAGNOSIS — R1013 Epigastric pain: Secondary | ICD-10-CM | POA: Diagnosis present

## 2023-11-24 SURGERY — EGD (ESOPHAGOGASTRODUODENOSCOPY)
Anesthesia: General

## 2023-11-24 MED ORDER — LACTATED RINGERS IV SOLN: INTRAVENOUS | Status: DC

## 2023-11-24 MED ORDER — PHENYLEPHRINE 80 MCG/ML (10ML) SYRINGE FOR IV PUSH (FOR BLOOD PRESSURE SUPPORT)
PREFILLED_SYRINGE | INTRAVENOUS | Status: DC | PRN
Start: 2023-11-24 — End: 2023-11-24
  Administered 2023-11-24 (×2): 160 ug via INTRAVENOUS

## 2023-11-24 MED ORDER — DEXMEDETOMIDINE HCL IN NACL 80 MCG/20ML IV SOLN
INTRAVENOUS | Status: DC | PRN
  Administered 2023-11-24: 4 ug via INTRAVENOUS

## 2023-11-24 MED ORDER — GLYCOPYRROLATE PF 0.2 MG/ML IJ SOSY
PREFILLED_SYRINGE | INTRAMUSCULAR | Status: AC
  Filled 2023-11-24: qty 1

## 2023-11-24 MED ORDER — ONDANSETRON HCL 4 MG/2ML IJ SOLN
INTRAMUSCULAR | Status: AC
  Filled 2023-11-24: qty 2

## 2023-11-24 MED ORDER — PHENYLEPHRINE 80 MCG/ML (10ML) SYRINGE FOR IV PUSH (FOR BLOOD PRESSURE SUPPORT)
PREFILLED_SYRINGE | INTRAVENOUS | Status: AC
  Filled 2023-11-24: qty 10

## 2023-11-24 MED ORDER — GLYCOPYRROLATE PF 0.2 MG/ML IJ SOSY
PREFILLED_SYRINGE | INTRAMUSCULAR | Status: DC | PRN
Start: 2023-11-24 — End: 2023-11-24
  Administered 2023-11-24: .1 mg via INTRAVENOUS

## 2023-11-24 MED ORDER — PROPOFOL 500 MG/50ML IV EMUL
INTRAVENOUS | Status: DC | PRN
  Administered 2023-11-24: 150 ug/kg/min via INTRAVENOUS

## 2023-11-24 MED ORDER — PROPOFOL 1000 MG/100ML IV EMUL
INTRAVENOUS | Status: AC
  Filled 2023-11-24: qty 100

## 2023-11-24 MED ORDER — ONDANSETRON HCL 4 MG/2ML IJ SOLN
INTRAMUSCULAR | Status: DC | PRN
  Administered 2023-11-24: 4 mg via INTRAVENOUS

## 2023-11-24 MED ORDER — LIDOCAINE HCL (PF) 2 % IJ SOLN
INTRAMUSCULAR | Status: AC
  Filled 2023-11-24: qty 5

## 2023-11-24 MED ORDER — LIDOCAINE HCL (PF) 2 % IJ SOLN
INTRAMUSCULAR | Status: DC | PRN
  Administered 2023-11-24: 60 mg via INTRADERMAL

## 2023-11-24 MED ORDER — PROPOFOL 10 MG/ML IV BOLUS
INTRAVENOUS | Status: DC | PRN
  Administered 2023-11-24: 20 mg via INTRAVENOUS
  Administered 2023-11-24: 70 mg via INTRAVENOUS
  Administered 2023-11-24: 30 mg via INTRAVENOUS

## 2023-11-24 NOTE — Progress Notes (Signed)
 Patient states feels nauseous shortly after arrival to post-op.  Comfort measures provided - notified CRNA Belinda. CRNA gave anti nausea medication

## 2023-11-24 NOTE — Transfer of Care (Addendum)
 Immediate Anesthesia Transfer of Care Note  Patient: Cheryl Rowe  Procedure(s) Performed: EGD (ESOPHAGOGASTRODUODENOSCOPY)  Patient Location: Endoscopy Unit  Anesthesia Type:General  Level of Consciousness: drowsy and patient cooperative  Airway & Oxygen Therapy: Patient Spontanous Breathing and Patient connected to face mask oxygen  Post-op Assessment: Report given to RN and Post -op Vital signs reviewed and stable  Post vital signs: Reviewed and stable  Last Vitals:  Vitals Value Taken Time  BP 104/66 11/24/23   1348  Temp 36.7 11/24/23   1348  Pulse 107 11/24/23   1348  Resp 19 11/24/23   1348  SpO2 98% 11/24/23   1348    Last Pain:  Vitals:   11/24/23 1325  TempSrc:   PainSc: 5       Patients Stated Pain Goal: 4 (11/24/23 1249)  Complications: Patient with positive emesis post procedure. Zofran 4 mg IV administered.

## 2023-11-24 NOTE — Progress Notes (Signed)
 Patient states feels much better from nausea .

## 2023-11-24 NOTE — Interval H&P Note (Signed)
 History and Physical Interval Note:  11/24/2023 1:17 PM  Cheryl Rowe  has presented today for surgery, with the diagnosis of DYSPEPSIA, EARLY SATIETY.  The various methods of treatment have been discussed with the patient and family. After consideration of risks, benefits and other options for treatment, the patient has consented to  Procedure(s) with comments: EGD (ESOPHAGOGASTRODUODENOSCOPY) (N/A) - 1:30PM;ASA 1-2 as a surgical intervention.  The patient's history has been reviewed, patient examined, no change in status, stable for surgery.  I have reviewed the patient's chart and labs.  Questions were answered to the patient's satisfaction.     Juanetta Beets [name]

## 2023-11-24 NOTE — Anesthesia Preprocedure Evaluation (Signed)
 Anesthesia Evaluation  Patient identified by MRN, date of birth, ID band Patient awake    Reviewed: Allergy & Precautions, H&P , NPO status , Patient's Chart, lab work & pertinent test results, reviewed documented beta blocker date and time   History of Anesthesia Complications (+) Family history of anesthesia reaction  Airway Mallampati: II  TM Distance: >3 FB Neck ROM: full    Dental no notable dental hx.    Pulmonary neg pulmonary ROS   Pulmonary exam normal breath sounds clear to auscultation       Cardiovascular Exercise Tolerance: Good hypertension, negative cardio ROS + Valvular Problems/Murmurs  Rhythm:regular Rate:Normal     Neuro/Psych  Headaches PSYCHIATRIC DISORDERS Anxiety Depression     Neuromuscular disease negative neurological ROS  negative psych ROS   GI/Hepatic negative GI ROS, Neg liver ROS, hiatal hernia,GERD  ,,  Endo/Other  negative endocrine ROS    Renal/GU negative Renal ROS  negative genitourinary   Musculoskeletal   Abdominal   Peds  Hematology negative hematology ROS (+)   Anesthesia Other Findings   Reproductive/Obstetrics negative OB ROS                             Anesthesia Physical Anesthesia Plan  ASA: 2  Anesthesia Plan: General   Post-op Pain Management:    Induction:   PONV Risk Score and Plan: Propofol infusion  Airway Management Planned:   Additional Equipment:   Intra-op Plan:   Post-operative Plan:   Informed Consent: I have reviewed the patients History and Physical, chart, labs and discussed the procedure including the risks, benefits and alternatives for the proposed anesthesia with the patient or authorized representative who has indicated his/her understanding and acceptance.     Dental Advisory Given  Plan Discussed with: CRNA  Anesthesia Plan Comments:        Anesthesia Quick Evaluation

## 2023-11-24 NOTE — Discharge Instructions (Signed)
  Discharge instructions Please read the instructions outlined below and refer to this sheet in the next few weeks. These discharge instructions provide you with general information on caring for yourself after you leave the hospital. Your doctor may also give you specific instructions. While your treatment has been planned according to the most current medical practices available, unavoidable complications occasionally occur. If you have any problems or questions after discharge, please call your doctor. ACTIVITY You may resume your regular activity but move at a slower pace for the next 24 hours.  Take frequent rest periods for the next 24 hours.  Walking will help expel (get rid of) the air and reduce the bloated feeling in your abdomen.  No driving for 24 hours (because of the anesthesia (medicine) used during the test).  You may shower.  Do not sign any important legal documents or operate any machinery for 24 hours (because of the anesthesia used during the test).  NUTRITION Drink plenty of fluids.  You may resume your normal diet.  Begin with a light meal and progress to your normal diet.  Avoid alcoholic beverages for 24 hours or as instructed by your caregiver.  MEDICATIONS You may resume your normal medications unless your caregiver tells you otherwise.  WHAT YOU CAN EXPECT TODAY You may experience abdominal discomfort such as a feeling of fullness or "gas" pains.  FOLLOW-UP Your doctor will discuss the results of your test with you.  SEEK IMMEDIATE MEDICAL ATTENTION IF ANY OF THE FOLLOWING OCCUR: Excessive nausea (feeling sick to your stomach) and/or vomiting.  Severe abdominal pain and distention (swelling).  Trouble swallowing.  Temperature over 101 F (37.8 C).  Rectal bleeding or vomiting of blood.    Stop using high dose aspirin including Goody/BC powders, NSAIDs such as Aleve, ibuprofen, naproxen, Motrin, Voltaren or Advil (even the topical ones)    I hope you have a  great rest of your week!   Vista Lawman , M.D.. Gastroenterology and Hepatology [facility]

## 2023-11-24 NOTE — Op Note (Signed)
 [facility] Patient Name: Cheryl Rowe Procedure Date: 11/24/2023 1:14 PM MRN: [id number] Date of Birth: 10-24-1960 Attending MD: Sanjuan Dame , MD, [id number] CSN: [id number] Age: 63 Admit Type: Outpatient Procedure:                Upper GI endoscopy Indications:              Epigastric abdominal pain, Dyspepsia, Early                            satiety, Nausea Providers:                Sanjuan Dame, MD, Sheran Fava, Lennice Sites                            Technician, Technician Referring MD:              Medicines:                Monitored Anesthesia Care Complications:            No immediate complications. Estimated Blood Loss:     Estimated blood loss was minimal. Procedure:                Pre-Anesthesia Assessment:                           - Prior to the procedure, a History and Physical                            was performed, and patient medications and                            allergies were reviewed. The patient's tolerance of                            previous anesthesia was also reviewed. The risks                            and benefits of the procedure and the sedation                            options and risks were discussed with the patient.                            All questions were answered, and informed consent                            was obtained. Prior Anticoagulants: The patient has                            taken no anticoagulant or antiplatelet agents. ASA                            Grade Assessment: II - A patient with mild systemic  disease. After reviewing the risks and benefits,                            the patient was deemed in satisfactory condition to                            undergo the procedure.                           After obtaining informed consent, the endoscope was                            passed under direct vision. Throughout the                            procedure, the patient's  blood pressure, pulse, and                            oxygen saturations were monitored continuously. The                            GIF-H190 ([id number]) scope was introduced through the                            mouth, and advanced to the second part of duodenum.                            The upper GI endoscopy was accomplished without                            difficulty. The patient tolerated the procedure                            well. Scope In: 1:30:46 PM Scope Out: 1:38:24 PM Total Procedure Duration: 0 hours 7 minutes 38 seconds  Findings:      Esophagogastric landmarks were identified: the Z-line was found at 34 cm       and the site of hiatal narrowing was found at 40 cm from the incisors.      LA Grade A (one or more mucosal breaks less than 5 mm, not extending       between tops of 2 mucosal folds) esophagitis with no bleeding was found       in the lower third of the esophagus.      A 6 cm hiatal hernia was present.      Mild inflammation characterized by erosions and erythema was found in       the gastric antrum. Biopsies were taken with a cold forceps for       histology.      A non-bleeding diverticulum was found in the second portion of the       duodenum.      The duodenal bulb and second portion of the duodenum were normal. Impression:               - Esophagogastric landmarks identified.                           -  LA Grade A reflux esophagitis with no bleeding.                           - 6 cm hiatal hernia.                           - Gastritis. Biopsied.                           - Non-bleeding duodenal diverticulum.                           - Normal duodenal bulb and second portion of the                            duodenum. Moderate Sedation:      Per Anesthesia Care Recommendation:           - Patient has a contact number available for                            emergencies. The signs and symptoms of potential                            delayed  complications were discussed with the                            patient. Return to normal activities tomorrow.                            Written discharge instructions were provided to the                            patient.                           - Resume previous diet.                           - Continue present medications.                           - Await pathology results.                           -PPI daily                           -Avoid all NSAIDs                           -Follow up at [facility] for ERCP/EUS given biliary                            duct dilation and elevated liver enzymes Procedure Code(s):        --- Professional ---  47829, Esophagogastroduodenoscopy, flexible,                            transoral; with biopsy, single or multiple Diagnosis Code(s):        --- Professional ---                           K44.9, Diaphragmatic hernia without obstruction or                            gangrene                           K29.70, Gastritis, unspecified, without bleeding                           R10.13, Epigastric pain                           R68.81, Early satiety                           R11.0, Nausea                           K57.10, Diverticulosis of small intestine without                            perforation or abscess without bleeding CPT copyright 2022 American Medical Association. All rights reserved. The codes documented in this report are preliminary and upon coder review may  be revised to meet current compliance requirements. Sanjuan Dame, MD Sanjuan Dame, MD 11/24/2023 1:49:55 PM This report has been signed electronically. Number of Addenda: 0

## 2023-11-25 ENCOUNTER — Encounter (HOSPITAL_COMMUNITY): Payer: Self-pay | Admitting: Gastroenterology

## 2023-11-25 LAB — SURGICAL PATHOLOGY

## 2023-11-26 ENCOUNTER — Other Ambulatory Visit: Payer: Self-pay | Admitting: Family Medicine

## 2023-11-26 MED ORDER — ONDANSETRON 4 MG PO TBDP: 4.0000 mg | ORAL_TABLET | Freq: Three times a day (TID) | ORAL | 0 refills | Status: AC | PRN

## 2023-11-26 NOTE — Telephone Encounter (Signed)
 Copied from CRM [id number]. Topic: Clinical - Medication Refill >> Nov 26, 2023 12:27 PM Higinio Roger wrote: Most Recent Primary Care Visit:  Provider: Lilyan Punt A  Department: [facility]  Visit Type: OFFICE VISIT  Date: 10/06/2023  Medication: ondansetron (ZOFRAN-ODT) 4 MG disintegrating tablet   Has the patient contacted their pharmacy? No (Agent: If no, request that the patient contact the pharmacy for the refill. If patient does not wish to contact the pharmacy document the reason why and proceed with request.) (Agent: If yes, when and what did the pharmacy advise?)  Is this the correct pharmacy for this prescription? Yes If no, delete pharmacy and type the correct one.  This is the patient's preferred pharmacy:   [id number] - [place], [place] - [address] [facility] [address] [place] [address] Phone: [phone] Fax: [phone]  Has the prescription been filled recently? Yes  Is the patient out of the medication? Yes  Has the patient been seen for an appointment in the last year OR does the patient have an upcoming appointment? Yes  Can we respond through MyChart? Yes  Agent: Please be advised that Rx refills may take up to 3 business days. We ask that you follow-up with your pharmacy.

## 2023-11-27 NOTE — Anesthesia Postprocedure Evaluation (Signed)
 Anesthesia Post Note  Patient: PATTRICIA WEIHER  Procedure(s) Performed: EGD (ESOPHAGOGASTRODUODENOSCOPY)  Patient location during evaluation: Phase II Anesthesia Type: General Level of consciousness: awake Pain management: pain level controlled Vital Signs Assessment: post-procedure vital signs reviewed and stable Respiratory status: spontaneous breathing and respiratory function stable Cardiovascular status: blood pressure returned to baseline and stable Postop Assessment: no headache and no apparent nausea or vomiting Anesthetic complications: no Comments: Late entry   No notable events documented.   Last Vitals:  Vitals:   11/24/23 1249 11/24/23 1348  BP: 126/64 104/66  Pulse: 77 (!) 109  Resp: 13 19  Temp: 36.9 C 36.7 C  SpO2:  98%    Last Pain:  Vitals:   11/24/23 1348  TempSrc: Oral  PainSc: 3                  Windell Norfolk

## 2023-11-29 NOTE — Progress Notes (Signed)
 I reviewed the pathology results. Ann, can you send her a letter with the findings as described below please?  Thanks,  Vista Lawman, MD Gastroenterology and Hepatology [facility]  ---------------------------------------------------------------------------------------------  [facility] [address], [address], [place], [place] [address] Phone:  [phone]   11/29/23 [place], [place]   Dear Cheryl Rowe,  I am writing to inform you that the biopsies taken during your recent endoscopic examination showed:  A. STOMACH, BIOPSY:  - Reactive gastropathy.  - Negative for H. pylori on HE stain  - No intestinal metaplasia, dysplasia, or malignancy.   What does this mean?  No H. Pylori bacteria in the stomach or any early cancer changes   Also I value your feedback , so if you get a survey , please take the time to fill it out and thank you for choosing [facility]  Please call us at [phone] if you have persistent problems or have questions about your condition that have not been fully answered at this time.  Sincerely,  Vista Lawman, MD Gastroenterology and Hepatology

## 2023-12-06 ENCOUNTER — Encounter (INDEPENDENT_AMBULATORY_CARE_PROVIDER_SITE_OTHER): Payer: Self-pay | Admitting: *Deleted

## 2023-12-06 DIAGNOSIS — R7989 Other specified abnormal findings of blood chemistry: Secondary | ICD-10-CM | POA: Diagnosis not present

## 2023-12-06 DIAGNOSIS — R1110 Vomiting, unspecified: Secondary | ICD-10-CM | POA: Diagnosis not present

## 2023-12-06 DIAGNOSIS — R1013 Epigastric pain: Secondary | ICD-10-CM | POA: Diagnosis not present

## 2023-12-06 DIAGNOSIS — K838 Other specified diseases of biliary tract: Secondary | ICD-10-CM | POA: Diagnosis not present

## 2023-12-23 DIAGNOSIS — H5203 Hypermetropia, bilateral: Secondary | ICD-10-CM | POA: Diagnosis not present

## 2024-01-04 ENCOUNTER — Encounter: Payer: Self-pay | Admitting: Family Medicine

## 2024-01-04 ENCOUNTER — Ambulatory Visit: Payer: Medicare HMO | Admitting: Family Medicine

## 2024-01-04 VITALS — BP 114/80

## 2024-01-04 DIAGNOSIS — E7849 Other hyperlipidemia: Secondary | ICD-10-CM

## 2024-01-04 DIAGNOSIS — Z79899 Other long term (current) drug therapy: Secondary | ICD-10-CM | POA: Diagnosis not present

## 2024-01-04 DIAGNOSIS — Z79891 Long term (current) use of opiate analgesic: Secondary | ICD-10-CM | POA: Diagnosis not present

## 2024-01-04 DIAGNOSIS — I10 Essential (primary) hypertension: Secondary | ICD-10-CM | POA: Diagnosis not present

## 2024-01-04 DIAGNOSIS — F321 Major depressive disorder, single episode, moderate: Secondary | ICD-10-CM | POA: Diagnosis not present

## 2024-01-04 MED ORDER — OXYCODONE HCL 10 MG PO TABS: ORAL_TABLET | ORAL | 0 refills | Status: DC

## 2024-01-04 NOTE — Progress Notes (Signed)
 Subjective:    Patient ID: Cheryl Rowe, female    DOB: Sep 27, 1960, 63 y.o.   MRN: [id number]  HPI This patient was seen today for chronic pain  The medication list was reviewed and updated.   Location of Pain for which the patient has been treated with regarding narcotics: Patient does have a chronic back pain has had multiple surgeries in the past unfortunately has been on 4 oxycodones daily understands the need to try to taper this down  Onset of this pain: Present for years   -Compliance with medication: Good compliance  - Number patient states they take daily: 4 tablets daily  -Reason for ongoing use of opioids does not get adequate relief with just Tylenol  NSAIDs  What other measures have been tried outside of opioids Tylenol , NSAIDs, Cymbalta, gabapentin, Lyrica , pain meds  In the ongoing specialists regarding this condition none currently  -when was the last dose patient took?  Within the past 24 hours  The patient was advised the importance of maintaining medication and not using illegal substances with these.  Here for refills and follow up  The patient was educated that we can provide 3 monthly scripts for their medication, it is their responsibility to follow the instructions.  Side effects or complications from medications: Denies side effects Patient does use gummy CBD bears for sleep  Patient is aware that pain medications are meant to minimize the severity of the pain to allow their pain levels to improve to allow for better function. They are aware of that pain medications cannot totally remove their pain.  Due for UDT ( at least once per year) (pain management contract is also completed at the time of the UDT): Today  Scale of 1 to 10 ( 1 is least 10 is most) Your pain level without the medicine: 8 Your pain level with medication 9  Scale 1 to 10 ( 1-helps very little, 10 helps very well) How well does your pain medication reduce your pain so you can  function better through out the day?  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 High risk medication use (Primary) Today - ToxASSURE Select 13 (MW), Urine  2. Encounter for long-term opiate analgesic use Today The patient was seen in followup for chronic pain. A review over at their current pain status was discussed. Drug registry was checked. Prescriptions were given.  Regular follow-up recommended. Discussion was held regarding the importance of compliance with medication as well as pain medication contract.  Patient was informed that medication may cause drowsiness and should not be combined  with other medications/alcohol or street drugs. If the patient feels medication is causing altered alertness then do not drive or operate dangerous equipment.  Should be noted that the patient appears to be meeting appropriate use of  opioids and response.  Evidenced by improved function and decent pain control without significant side effects and no evidence of overt aberrancy issues.  Upon discussion with the patient today they understand that opioid therapy is optional and they feel that the pain has been refractory to reasonable conservative measures and is significant and affecting quality of life enough to warrant ongoing therapy and wishes to  continue opioids.  Refills were provided.  [place]  medical Board guidelines regarding the pain medicine has been reviewed.  CDC guidelines most updated 2022 has been reviewed by the prescriber.  PDMP is checked on a regular basis yearly urine drug screen and pain management contract  Treatment plan for this patient includes #1-gentle stretching exercises as shown daily basis 2.  Mild strength exercises 3 times per week #3 continue pain medications #4 notify us  if any digression  In my opinion patient compliance with the guidelines Pain medicine does help her We did discuss gradually tapering We will cut 5 tablets/month and will recheck her in 3 months time to see how things are going - ToxASSURE Select 13 (MW), Urine  3. Other hyperlipidemia No lab work indicated currently  4. Primary hypertension Blood pressure good control no lab work indicated currently Depression stable continue current measures Patient will follow-up in 3 months we will discuss things further at that time

## 2024-01-07 ENCOUNTER — Ambulatory Visit: Admitting: Gastroenterology

## 2024-01-16 ENCOUNTER — Other Ambulatory Visit: Payer: Self-pay | Admitting: Family Medicine

## 2024-01-23 ENCOUNTER — Other Ambulatory Visit: Payer: Self-pay | Admitting: Family Medicine

## 2024-01-25 ENCOUNTER — Other Ambulatory Visit: Payer: Self-pay

## 2024-01-25 MED ORDER — VALSARTAN-HYDROCHLOROTHIAZIDE 160-12.5 MG PO TABS: 1.0000 | ORAL_TABLET | Freq: Every day | ORAL | 3 refills | Status: AC

## 2024-02-04 ENCOUNTER — Other Ambulatory Visit: Payer: Self-pay | Admitting: Family Medicine

## 2024-02-23 ENCOUNTER — Encounter: Payer: Self-pay | Admitting: Family Medicine

## 2024-02-23 ENCOUNTER — Ambulatory Visit (INDEPENDENT_AMBULATORY_CARE_PROVIDER_SITE_OTHER): Admitting: Family Medicine

## 2024-02-23 VITALS — BP 146/92 | HR 112 | Ht 64.0 in | Wt 137.0 lb

## 2024-02-23 DIAGNOSIS — F322 Major depressive disorder, single episode, severe without psychotic features: Secondary | ICD-10-CM

## 2024-02-23 NOTE — Progress Notes (Signed)
   Subjective:    Patient ID: Cheryl Rowe, female    DOB: Apr 21, 1961, 63 y.o.   MRN: [id number]  HPI  Depression concerns , pt is teary eyed asked to fill out phq9 paperwork for today's evaluation- pt agrees  Patient here today with depression symptoms been coming on over the past several weeks now causing her significant anxiety sadness depression fatigue she relates that she is very frustrated with her home situation she stays with her parents she is trying to sell her former home in [place]  She lost her husband to illness a few years ago and it has been very difficult for her ever since She finds himself feeling hopeless at times.  She states if she was to fall asleep and not wake up that would be okay with her but she does not have intentions on hurting herself or overdosing She states she no longer wants to be on pain medicine she states she has reduced it down to 2 tablets/day and is interested in coming off She is taking the Effexor  every day She is willing to go ahead and be seen by psychiatry for counseling and medications adjustments She does state at times she is thought about hurting herself with a handgun but then she states that she does not have a desire to do that currently and she would seek out help should that feeling come back Review of Systems     Objective:   Physical Exam Tearful After a period of time we were able to have a good discussion regarding her symptomatology       Assessment & Plan:  Major depression Currently not suicidal but she has had feelings that she would be better off dead We discussed the importance of seeking help We also discussed an emergency plan of going to the ER or mental health urgent should she feel overwhelmed She agrees to continue Effexor  We will reach out to psychiatry to see if there is a additional medicine that could be added We have tried him Abilify  in the past but that did not seem to help her She has had previous times  where her depression has become overwhelming but more recently she had been doing well She struggles a lot with having lived with her parents who she states argue a lot and causes a significant amount of stress The patient has plans on selling her home in [place]  and getting an apartment We will do a follow-up within 2 weeks We will proceed forward on the above She will also go to [facility] mental health urgent care clinic tomorrow for further discussion hopefully they can get her hooked into some counseling We will reach out to her again later this week

## 2024-03-08 ENCOUNTER — Ambulatory Visit: Admitting: Family Medicine

## 2024-03-08 ENCOUNTER — Other Ambulatory Visit: Payer: Self-pay | Admitting: Family Medicine

## 2024-03-08 VITALS — BP 116/73 | HR 94 | Temp 99.1°F | Ht 64.0 in | Wt 148.6 lb

## 2024-03-08 DIAGNOSIS — F322 Major depressive disorder, single episode, severe without psychotic features: Secondary | ICD-10-CM

## 2024-03-08 DIAGNOSIS — Z79891 Long term (current) use of opiate analgesic: Secondary | ICD-10-CM

## 2024-03-08 MED ORDER — OXYCODONE HCL 10 MG PO TABS: ORAL_TABLET | ORAL | 0 refills | Status: DC

## 2024-03-08 MED ORDER — BUPROPION HCL ER (XL) 150 MG PO TB24
150.0000 mg | ORAL_TABLET | Freq: Every day | ORAL | 5 refills | Status: DC
Start: 2024-03-08 — End: 2024-03-30

## 2024-03-08 NOTE — Progress Notes (Signed)
 Subjective:    Patient ID: Cheryl Rowe, female    DOB: 24-Jun-1961, 63 y.o.   MRN: [id number]  HPI Patient with significant depression Start to do some better She is journaling I have given her referral to counseling she will do She is taking her medicine She is open to adding Wellbutrin  This was added today She is not suicidal  This patient was seen today for chronic pain  The medication list was reviewed and updated.   Location of Pain for which the patient has been treated with regarding narcotics: Upper mid and lower back  Onset of this pain: Present for years   -Compliance with medication: She has been trying to taper good compliance  - Number patient states they take daily: 3 or less currently  -Reason for ongoing use of opioids intractable pain  What other measures have been tried outside of opioids Tylenol , NSAIDs, pain modifying agents, opioids, surgery  In the ongoing specialists regarding this condition back specialist  -when was the last dose patient took?  Past 24 hours  The patient was advised the importance of maintaining medication and not using illegal substances with these.  Here for refills and follow up  The patient was educated that we can provide 3 monthly scripts for their medication, it is their responsibility to follow the instructions.  Side effects or complications from medications: None  Patient is aware that pain medications are meant to minimize the severity of the pain to allow their pain levels to improve to allow for better function. They are aware of that pain medications cannot totally remove their pain.  Due for UDT ( at least once per year) (pain management contract is also completed at the time of the UDT): Today  Scale of 1 to 10 ( 1 is least 10 is most) Your pain level without the medicine: 8-9 Your pain level with medication 4-5  Scale 1 to 10 ( 1-helps very little, 10 helps very well) How well does your pain medication  reduce your pain so you can function better through out the day?  7-8  Quality of the pain: Throbbing aching  Persistence of the pain: Present all time  Modifying factors: Worse with activity        Review of Systems     Objective:   Physical Exam  General-in no acute distress Eyes-no discharge Lungs-respiratory rate normal, CTA CV-no murmurs,RRR Extremities skin warm dry no edema Neuro grossly normal Behavior normal, alert Patient continues to taper her pain medicine using 2 and half tablets per day      Assessment & Plan:  Follow-up in approximately 3 months Information regarding counseling given to the patient Continue Effexor  add Wellbutrin  Give us  feedback over the next few weeks  Gradually continue to taper down on her pain medicine Urine drug screen today The patient was seen in followup for chronic pain. A review over at their current pain status was discussed. Drug registry was checked. Prescriptions were given.  Regular follow-up recommended. Discussion was held regarding the importance of compliance with medication as well as pain medication contract.  Patient was informed that medication may cause drowsiness and should not be combined  with other medications/alcohol or street drugs. If the patient feels medication is causing altered alertness then do not drive or operate dangerous equipment.  Should be noted that the patient appears to be meeting appropriate use of opioids and response.  Evidenced by improved function and decent pain control without significant side  effects and no evidence of overt aberrancy issues.  Upon discussion with the patient today they understand that opioid therapy is optional and they feel that the pain has been refractory to reasonable conservative measures and is significant and affecting quality of life enough to warrant ongoing therapy and wishes to continue opioids.  Refills were provided.  [place]  medical Board  guidelines regarding the pain medicine has been reviewed.  CDC guidelines most updated 2022 has been reviewed by the prescriber.  PDMP is checked on a regular basis yearly urine drug screen and pain management contract  Treatment plan for this patient includes #1-gentle stretching exercises as shown daily basis 2.  Mild strength exercises 3 times per week #3 continue pain medications #4 notify us  if any digression

## 2024-03-09 LAB — MED LIST OPTION NOT SELECTED

## 2024-03-12 ENCOUNTER — Ambulatory Visit: Payer: Self-pay | Admitting: Family Medicine

## 2024-03-12 LAB — SPECIMEN STATUS REPORT

## 2024-03-12 LAB — TOXASSURE SELECT 13 (MW), URINE

## 2024-03-23 ENCOUNTER — Encounter: Payer: Self-pay | Admitting: Pharmacist

## 2024-03-23 NOTE — Progress Notes (Signed)
 Pharmacy Quality Measure Review  This patient is appearing on a report for being at risk of failing the adherence measure for cholesterol (statin) medications this calendar year.   Medication: rosuvastatin   Last fill date: 10/27/23 for 84 day supply - patient only filled once this year, so will not fail the measure if she does not fill again.   Medication has been discontinued due to concerns about liver enzymes, she is currently working with GI. Last LDL while on therapy was 133, PREVENT risk score based on that lipid panel and recent vitals is ~ 4.6% risk of CVD. Aortic atherosclerosis noted on imaging.    No action needed at this time. Could consider coronary artery calcium  scoring, after patient completes evaluation and management of current GI concerns.   Catie IVAR Centers, PharmD, [facility] Clinical Pharmacist [phone]

## 2024-03-29 ENCOUNTER — Ambulatory Visit: Admitting: Family Medicine

## 2024-03-30 ENCOUNTER — Other Ambulatory Visit: Payer: Self-pay | Admitting: Family Medicine

## 2024-04-07 ENCOUNTER — Ambulatory Visit: Admitting: Family Medicine

## 2024-04-18 DIAGNOSIS — F331 Major depressive disorder, recurrent, moderate: Secondary | ICD-10-CM | POA: Diagnosis not present

## 2024-04-18 DIAGNOSIS — F411 Generalized anxiety disorder: Secondary | ICD-10-CM | POA: Diagnosis not present

## 2024-04-18 DIAGNOSIS — F4381 Prolonged grief disorder: Secondary | ICD-10-CM | POA: Diagnosis not present

## 2024-04-24 DIAGNOSIS — F4381 Prolonged grief disorder: Secondary | ICD-10-CM | POA: Diagnosis not present

## 2024-05-02 DIAGNOSIS — F4312 Post-traumatic stress disorder, chronic: Secondary | ICD-10-CM | POA: Diagnosis not present

## 2024-05-17 ENCOUNTER — Other Ambulatory Visit: Payer: Self-pay | Admitting: Family Medicine

## 2024-05-30 ENCOUNTER — Encounter: Payer: Self-pay | Admitting: Family Medicine

## 2024-05-31 ENCOUNTER — Other Ambulatory Visit: Payer: Self-pay | Admitting: Family Medicine

## 2024-05-31 MED ORDER — OXYCODONE HCL 10 MG PO TABS: ORAL_TABLET | ORAL | 0 refills | Status: DC

## 2024-06-19 DIAGNOSIS — F411 Generalized anxiety disorder: Secondary | ICD-10-CM | POA: Diagnosis not present

## 2024-06-19 DIAGNOSIS — F4381 Prolonged grief disorder: Secondary | ICD-10-CM | POA: Diagnosis not present

## 2024-06-29 ENCOUNTER — Other Ambulatory Visit: Payer: Self-pay | Admitting: Family Medicine

## 2024-06-29 MED ORDER — PREDNISONE 20 MG PO TABS: ORAL_TABLET | ORAL | 0 refills | Status: DC

## 2024-06-29 NOTE — Progress Notes (Signed)
 Patient states that she had poison ivy on her arms leg been present for several days we discussed options of cream versus prednisone  patient chose prednisone  prescription sent and patient will follow-up if ongoing troubles

## 2024-07-04 ENCOUNTER — Other Ambulatory Visit: Payer: Self-pay

## 2024-07-04 ENCOUNTER — Ambulatory Visit (INDEPENDENT_AMBULATORY_CARE_PROVIDER_SITE_OTHER): Admitting: Family Medicine

## 2024-07-04 ENCOUNTER — Encounter: Payer: Self-pay | Admitting: Family Medicine

## 2024-07-04 VITALS — BP 126/84 | HR 114 | Temp 99.0°F | Ht 64.0 in | Wt 132.1 lb

## 2024-07-04 DIAGNOSIS — Z79891 Long term (current) use of opiate analgesic: Secondary | ICD-10-CM | POA: Diagnosis not present

## 2024-07-04 DIAGNOSIS — I10 Essential (primary) hypertension: Secondary | ICD-10-CM

## 2024-07-04 DIAGNOSIS — E7849 Other hyperlipidemia: Secondary | ICD-10-CM | POA: Diagnosis not present

## 2024-07-04 DIAGNOSIS — R748 Abnormal levels of other serum enzymes: Secondary | ICD-10-CM | POA: Diagnosis not present

## 2024-07-04 DIAGNOSIS — Z23 Encounter for immunization: Secondary | ICD-10-CM

## 2024-07-04 DIAGNOSIS — F419 Anxiety disorder, unspecified: Secondary | ICD-10-CM | POA: Diagnosis not present

## 2024-07-04 DIAGNOSIS — F32A Depression, unspecified: Secondary | ICD-10-CM

## 2024-07-04 MED ORDER — BUSPIRONE HCL 10 MG PO TABS: 10.0000 mg | ORAL_TABLET | Freq: Two times a day (BID) | ORAL | 5 refills | Status: DC

## 2024-07-04 MED ORDER — OXYCODONE HCL 10 MG PO TABS: ORAL_TABLET | ORAL | 0 refills | Status: DC

## 2024-07-04 MED ORDER — OXYCODONE HCL 10 MG PO TABS: ORAL_TABLET | ORAL | 0 refills | Status: AC

## 2024-07-04 MED ORDER — PANTOPRAZOLE SODIUM 40 MG PO TBEC: 40.0000 mg | DELAYED_RELEASE_TABLET | Freq: Every day | ORAL | 1 refills | Status: AC

## 2024-07-04 MED ORDER — AMLODIPINE BESYLATE 10 MG PO TABS: 10.0000 mg | ORAL_TABLET | Freq: Every day | ORAL | 1 refills | Status: AC

## 2024-07-04 NOTE — Progress Notes (Signed)
 Subjective:    Patient ID: Cheryl Rowe, female    DOB: [birth year], 63 y.o.   MRN: [id number]  HPI F/u on pain medication, it is helping  Pt has depression but has spoken with you before about it  This patient was seen today for chronic pain  The medication list was reviewed and updated.   Location of Pain for which the patient has been treated with regarding narcotics:.  As well as her neck radiates into the hips  Onset of this pain: She has had this for years   -Compliance with medication: Good compliance with medicine  - Number patient states they take daily: 3 daily  -Reason for ongoing use of opioids has had previous surgery physical therapy NSAIDs Tylenol  nothing is helped enough  What other measures have been tried outside of opioids see above  In the ongoing specialists regarding this condition previously back specialist none currently  -when was the last dose patient took?  Past 24 hours  The patient was advised the importance of maintaining medication and not using illegal substances with these.  Here for refills and follow up  The patient was educated that we can provide 3 monthly scripts for their medication, it is their responsibility to follow the instructions.  Side effects or complications from medications: None  Patient is aware that pain medications are meant to minimize the severity of the pain to allow their pain levels to improve to allow for better function. They are aware of that pain medications cannot totally remove their pain.  Due for UDT ( at least once per year) (pain management contract is also completed at the time of the UDT): Patient up-to-date  Scale of 1 to 10 ( 1 is least 10 is most) Your pain level without the medicine: 9 Your pain level with medication 5  Scale 1 to 10 ( 1-helps very little, 10 helps very well) How well does your pain medication reduce your pain so you can function better through out the day?  8  Quality of  the pain: Throbbing aching  Persistence of the pain: Does not all time  Modifying factors: Worse with activity     Discussed the use of AI scribe software for clinical note transcription with the patient, who gave verbal consent to proceed.  History of Present Illness   Cheryl Rowe is a 63 year old female who presents with anxiety and medication management.  She experiences significant anxiety, particularly troublesome at night, affecting her sleep. She reports that Wellbutrin  is not effective for her and she wishes to discontinue it. 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 Despite these efforts, she struggles with racing thoughts at night and finds herself unable to sleep.  She is currently taking pain medication and has managed to reduce her intake to two pills a day until a recent fall, which necessitated an increase back to three pills a day. She is currently taking three pills a day, totaling ninety pills a month.  She experiences heartburn and is taking an acid blocker daily, which is helping. However, coughing exacerbates the sensation of something being stuck in her throat.  She reports a lack of appetite, stating she only nibbles on food and does not feel hungry. She also mentions sleeping for about eleven hours a day but finds it difficult to get out of bed in the mornings, preferring to stay  in bed as a refuge from her thoughts.  She discusses her family dynamics, mentioning that her parents frequently argue, which adds to her stress. Her sister, Apolinar, is unable to assist with their mother due to her own issues with their father. She expresses concern about her mother's declining health and emotional state.  No current thoughts of self-harm, but she acknowledges having had such thoughts in the past. She has promised a friend, Tully, to reach out if these thoughts become overwhelming.         Review of Systems     Objective:   Physical Exam  General-in no acute distress Eyes-no discharge Lungs-respiratory rate normal, CTA CV-no murmurs,RRR Extremities skin warm dry no edema Neuro grossly normal Behavior normal, alert  Chronic pain requiring long-term opioid therapy Chronic pain managed with long-term opioid therapy. Plan to taper dosage after three months. - Continue current opioid regimen of three pills per day. - Plan to taper to two pills per day after three months.  Anxiety disorder and depression Anxiety and depression managed with medication. Wellbutrin  discontinued due to inefficacy. Buspar prescribed for anxiety. - Discontinued Wellbutrin . - Prescribed Buspar, low dose twice a day. - Referred to [facility] for further management.  Abnormal liver enzymes Previous labs showed elevated liver enzymes. Follow-up required. - Ordered follow-up liver function tests.  Essential hypertension Blood pressure well-controlled at 126/84 mmHg. - Continue current management.     Assessment & Plan:  1. Encounter for long-term opiate analgesic use (Primary) The patient was seen in followup for chronic pain. A review over at their current pain status was discussed. Drug registry was checked. Prescriptions were given.  Regular follow-up recommended. Discussion was held regarding the importance of compliance with medication as well as pain medication contract.  Patient was informed that medication may cause drowsiness and should not be combined  with other medications/alcohol or street drugs. If the patient feels medication is causing altered alertness then do not drive or operate dangerous equipment.  Should be noted that the patient appears to be meeting appropriate use of opioids and response.  Evidenced by improved function and decent pain control without significant side effects and no evidence of overt aberrancy issues.  Upon discussion with the patient today they  understand that opioid therapy is optional and they feel that the pain has been refractory to reasonable conservative measures and is significant and affecting quality of life enough to warrant ongoing therapy and wishes to continue opioids.  Refills were provided.  [place]  medical Board guidelines regarding the pain medicine has been reviewed.  CDC guidelines most updated 2022 has been reviewed by the prescriber.  PDMP is checked on a regular basis yearly urine drug screen and pain management contract  Treatment plan for this patient includes #1-gentle stretching exercises as shown daily basis 2.  Mild strength exercises 3 times per week #3 continue pain medications #4 notify us  if any digression Scripts were sent in PDMP was checked Patient benefits from the medicine Recheck her again in 3 months She states she is going to try to taper back even further  2. Other hyperlipidemia Continue medication healthy diet regular activity  3. Elevated liver enzymes Patient states she did not do her lab work previously she will go ahead and do the lab work currently we will see the results - CMP14+EGFR - ANA w/Reflex - Fe+TIBC+Fer - CBC with Differential - Anti-smooth muscle antibody, IgG - Mitochondrial/smooth muscle ab pnl  4. Anxiety and depression Add BuSpar  twice daily Also recommend counseling with [facility] - Ambulatory referral to Psychology  5. Primary hypertension Blood pressure decent control healthy diet  Patient not suicidal she states at times she wonders if she wants to keep living but she states she would not herself And requested to stop Wellbutrin  as she did not feel it was helping any Continue Effexor  Follow-up 3 months

## 2024-07-24 ENCOUNTER — Other Ambulatory Visit: Payer: Self-pay | Admitting: Family Medicine

## 2024-07-24 ENCOUNTER — Encounter: Payer: Self-pay | Admitting: Family Medicine

## 2024-07-24 NOTE — Telephone Encounter (Signed)
 Nurses Sorry that she had a fall If Cheryl Rowe is willing to do so please schedule her to be seen this week here in the office so we can evaluate her arm and order proper x-rays  As for lab orders those could be drawn while she is here at the office or through [facility] thank you

## 2024-07-25 ENCOUNTER — Ambulatory Visit: Admitting: Nurse Practitioner

## 2024-07-25 VITALS — BP 99/68 | Ht 64.0 in | Wt 136.1 lb

## 2024-07-25 DIAGNOSIS — F419 Anxiety disorder, unspecified: Secondary | ICD-10-CM | POA: Diagnosis not present

## 2024-07-25 DIAGNOSIS — F32A Depression, unspecified: Secondary | ICD-10-CM | POA: Diagnosis not present

## 2024-07-25 DIAGNOSIS — S40022A Contusion of left upper arm, initial encounter: Secondary | ICD-10-CM

## 2024-07-26 ENCOUNTER — Encounter: Payer: Self-pay | Admitting: Nurse Practitioner

## 2024-07-26 DIAGNOSIS — S40022A Contusion of left upper arm, initial encounter: Secondary | ICD-10-CM | POA: Insufficient documentation

## 2024-07-26 NOTE — Progress Notes (Signed)
 Subjective:    Patient ID: Cheryl Rowe, female    DOB: 30-Aug-1961, 63 y.o.   MRN: [id number]  HPI Discussed the use of AI scribe software for clinical note transcription with the patient, who gave verbal consent to proceed.  History of Present Illness Cheryl Rowe is a 63 year old female who presents with concerns about her mental health and a recent arm injury.  She is experiencing significant anxiety and depression, primarily related to the loss of her husband a few years ago. She has started counseling with Bari Bruns from [facility], although there have been issues with insurance coverage from [facility]. She lives with her parents, who have a contentious relationship, exacerbating her stress. She has had thoughts of self-harm and has had a plan in the past but not currently. She has discussed this with her counselor.   She is currently taking Effexor  75 mg three times a day and has recently started Buspar , which she reports no side effects from. She also takes oxycodone  10 mg, which she has reduced to twice a day, and uses a half gummy at night for sleep. She is concerned about her parents' ability to drive safely due to their age and her father's recent confusion while driving.  She reports a recent fall that occurred last week, resulting in a black eye and pain in her left arm. The fall happened when she tripped over a throw rug while running to catch her mother. She describes the pain as concentrated in the mid upper arm area, with a noticeable knot and swelling. She has been using Voltaren gel and Aleve  for pain management.      Review of Systems  Constitutional:  Positive for fatigue.  Respiratory:  Negative for cough, chest tightness and shortness of breath.   Cardiovascular:  Negative for chest pain.  Musculoskeletal:        Left upper outer localized arm pain.   Psychiatric/Behavioral:  Positive for dysphoric mood and suicidal ideas. Negative for self-injury and  sleep disturbance. The patient is nervous/anxious.       07/25/2024    2:20 PM  Depression screen PHQ 2/9  Decreased Interest 2  Down, Depressed, Hopeless 2  PHQ - 2 Score 4  Altered sleeping 2  Tired, decreased energy 2  Change in appetite 2  Feeling bad or failure about yourself  2  Trouble concentrating 2  Moving slowly or fidgety/restless 0  Suicidal thoughts 2  PHQ-9 Score 16  Difficult doing work/chores Somewhat difficult      07/25/2024    2:21 PM 07/04/2024    2:21 PM 03/08/2024   10:29 AM 02/23/2024    4:54 PM  GAD 7 : Generalized Anxiety Score  Nervous, Anxious, on Edge 2 3 3 3   Control/stop worrying 2 3 3 3   Worry too much - different things 2 2 3 3   Trouble relaxing 2 3 3 3   Restless 2 2 1 1   Easily annoyed or irritable 2 2 3 3   Afraid - awful might happen 2 2 3 3   Total GAD 7 Score 14 17 19 19   Anxiety Difficulty Somewhat difficult Somewhat difficult Somewhat difficult Extremely difficult    Social History   Tobacco Use   Smoking status: Never   Smokeless tobacco: Never  Vaping Use   Vaping status: Never Used  Substance Use Topics   Alcohol use: No   Drug use: No        Objective:   Physical  Exam Vitals and nursing note reviewed.  Constitutional:      General: She is not in acute distress. Cardiovascular:     Rate and Rhythm: Normal rate and regular rhythm.  Pulmonary:     Effort: Pulmonary effort is normal.     Breath sounds: Normal breath sounds.  Musculoskeletal:     Comments: Left upper outer arm: approx 2 cm oblong slightly raised superficial area, tender to palpation. No discoloration. Normal ROM of shoulder, elbow and wrist. Movement produces pain in the localized area of edema.   Neurological:     Mental Status: She is alert and oriented to person, place, and time.  Psychiatric:        Attention and Perception: Attention and perception normal.        Mood and Affect: Mood normal. Affect is tearful.        Speech: Speech normal.         Behavior: Behavior normal.        Thought Content: Thought content normal.        Cognition and Memory: Cognition normal.        Judgment: Judgment normal.     Comments: Tearful at times especially when discussing her late husband.     Today's Vitals   07/25/24 1420  BP: 99/68  Weight: 136 lb 1.9 oz (61.7 kg)  Height: 5' 4 (1.626 m)   Body mass index is 23.36 kg/m.         Assessment & Plan:  1. Contusion of left upper arm, initial encounter (Primary)  Apply ice or heat. - Use Aleve  for pain management, monitor for stomach upset. - Consider Voltaren gel. - Monitor symptoms, contact if pain persists or worsens. Will order xray at that time.   2. Anxiety and depression - Continue Effexor  and Buspar . - Continue counseling with Bari Bruns who is aware of her previous thoughts of self harm.  - Provided suicide hotline information and emergency contact app. Verbally agrees to seek help immediately if needed.  - Encouraged setting boundaries with parents. - Do not feel patient is in any danger at this time.   It was noted that patient has some small abrasions on her hand. Her insurance has been difficult covering vaccines, even the flu vaccine.  - Obtain tetanus vaccine at pharmacy. - Consider pneumonia vaccine at age 21 or earlier.  Return if symptoms worsen or fail to improve.

## 2024-07-27 ENCOUNTER — Other Ambulatory Visit: Payer: Self-pay | Admitting: Family Medicine

## 2024-08-13 ENCOUNTER — Ambulatory Visit: Payer: Self-pay | Admitting: Family Medicine

## 2024-08-14 LAB — CMP14+EGFR
ALT: 39 IU/L — ABNORMAL HIGH (ref 0–32)
AST: 37 IU/L (ref 0–40)
Albumin: 5 g/dL — ABNORMAL HIGH (ref 3.9–4.9)
Alkaline Phosphatase: 134 IU/L (ref 49–135)
BUN/Creatinine Ratio: 12 (ref 12–28)
BUN: 13 mg/dL (ref 8–27)
Bilirubin Total: 0.3 mg/dL (ref 0.0–1.2)
CO2: 26 mmol/L (ref 20–29)
Calcium: 9.8 mg/dL (ref 8.7–10.3)
Chloride: 100 mmol/L (ref 96–106)
Creatinine, Ser: 1.11 mg/dL — ABNORMAL HIGH (ref 0.57–1.00)
Globulin, Total: 2.2 g/dL (ref 1.5–4.5)
Glucose: 98 mg/dL (ref 70–99)
Potassium: 4.2 mmol/L (ref 3.5–5.2)
Sodium: 142 mmol/L (ref 134–144)
Total Protein: 7.2 g/dL (ref 6.0–8.5)
eGFR: 56 mL/min/1.73 — ABNORMAL LOW (ref >59)

## 2024-08-14 LAB — CBC WITH DIFFERENTIAL/PLATELET
Basophils Absolute: 0.1 x10E3/uL (ref 0.0–0.2)
Basos: 2 %
EOS (ABSOLUTE): 0.3 x10E3/uL (ref 0.0–0.4)
Eos: 4 %
Hematocrit: 38.3 % (ref 34.0–46.6)
Hemoglobin: 12.3 g/dL (ref 11.1–15.9)
Immature Grans (Abs): 0 x10E3/uL (ref 0.0–0.1)
Immature Granulocytes: 0 %
Lymphocytes Absolute: 2 x10E3/uL (ref 0.7–3.1)
Lymphs: 30 %
MCH: 29.8 pg (ref 26.6–33.0)
MCHC: 32.1 g/dL (ref 31.5–35.7)
MCV: 93 fL (ref 79–97)
Monocytes Absolute: 0.5 x10E3/uL (ref 0.1–0.9)
Monocytes: 7 %
Neutrophils Absolute: 3.8 x10E3/uL (ref 1.4–7.0)
Neutrophils: 57 %
Platelets: 428 x10E3/uL (ref 150–450)
RBC: 4.13 x10E6/uL (ref 3.77–5.28)
RDW: 13.1 % (ref 11.7–15.4)
WBC: 6.6 x10E3/uL (ref 3.4–10.8)

## 2024-08-14 LAB — MITOCHONDRIAL/SMOOTH MUSCLE AB PNL
Mitochondrial Ab: <20 U (ref 0.0–20.0)
Smooth Muscle Ab: 23 U — AB (ref 0–19)

## 2024-08-14 LAB — IRON,TIBC AND FERRITIN PANEL
Ferritin: 65 ng/mL (ref 15–150)
Iron Saturation: 20 % (ref 15–55)
Iron: 62 ug/dL (ref 27–139)
Total Iron Binding Capacity: 307 ug/dL (ref 250–450)
UIBC: 245 ug/dL (ref 118–369)

## 2024-08-14 LAB — ANA W/REFLEX: Anti Nuclear Antibody (ANA): NEGATIVE

## 2024-08-17 ENCOUNTER — Encounter: Payer: Self-pay | Admitting: Family Medicine

## 2024-08-18 ENCOUNTER — Encounter (INDEPENDENT_AMBULATORY_CARE_PROVIDER_SITE_OTHER): Payer: Self-pay

## 2024-09-25 ENCOUNTER — Ambulatory Visit (INDEPENDENT_AMBULATORY_CARE_PROVIDER_SITE_OTHER): Admitting: Gastroenterology

## 2024-10-05 ENCOUNTER — Encounter: Payer: Self-pay | Admitting: Family Medicine

## 2024-10-05 ENCOUNTER — Ambulatory Visit: Admitting: Family Medicine

## 2024-10-05 VITALS — BP 111/71 | HR 125 | Temp 97.0°F | Ht 64.0 in | Wt 129.6 lb

## 2024-10-05 DIAGNOSIS — F1190 Opioid use, unspecified, uncomplicated: Secondary | ICD-10-CM

## 2024-10-05 NOTE — Progress Notes (Unsigned)
" ° °  Subjective:    Patient ID: Cheryl Rowe, female    DOB: [birth year], 64 y.o.   MRN: [id number]  HPI  Room 9  Pt is here for 3 month follow up  Patient is here today with her sister She states it was difficult to calm because she did not want to talk about what is happening but now she is willing She states over the past several months she has been taking a supplement 7 hydroxy which is a derivative of Kratom plany she started off taking 1 of these twice a day she states without it she finds herself feeling very jittery nervous and anxious feeling she is now up to taking 2 of these tablets 3 times a day She also takes the oxycodone  2-3 times a day denies abusing the medicine states she is not having any drowsiness She states she wants to be able to get off of all these medicines if possible Review of Systems     Objective:   Physical Exam  General-in no acute distress Eyes-no discharge Lungs-respiratory rate normal, CTA CV-no murmurs,RRR Extremities skin warm dry no edema Neuro grossly normal Behavior normal, alert  Patient denies being suicidal     Assessment & Plan:  Drug misuse-I do not feel the patient is intentionally trying to misuse the medicine I think she started off and is simply taking something she thought was okay but it is now has a significant hold on her We will talk with her own local drug addiction office I think the patient would need to be possibly moved over to some sort of medication for opioid use disorder possibly Suboxone but she will need some expertise help to get her there 30 minutes spent with patient The patient gave permission for us  to speak with her and her sister regarding this  "

## 2024-10-06 ENCOUNTER — Other Ambulatory Visit: Payer: Self-pay | Admitting: Family Medicine

## 2024-10-06 ENCOUNTER — Encounter: Payer: Self-pay | Admitting: Family Medicine

## 2024-10-06 ENCOUNTER — Ambulatory Visit: Payer: Medicare HMO

## 2024-10-06 MED ORDER — OXYCODONE HCL 10 MG PO TABS: ORAL_TABLET | ORAL | 0 refills | Status: AC

## 2024-10-24 ENCOUNTER — Ambulatory Visit: Admitting: Family Medicine
